# Patient Record
Sex: Male | Born: 1945 | Race: White | Hispanic: No | Marital: Married | Smoking: Former smoker
Health system: Southern US, Community
[De-identification: ages and names within clinical notes are randomized; demographics above are authoritative.]

## PROBLEM LIST (undated history)

## (undated) DIAGNOSIS — C801 Malignant (primary) neoplasm, unspecified: Secondary | ICD-10-CM

## (undated) DIAGNOSIS — E785 Hyperlipidemia, unspecified: Secondary | ICD-10-CM

## (undated) DIAGNOSIS — M109 Gout, unspecified: Secondary | ICD-10-CM

## (undated) DIAGNOSIS — K219 Gastro-esophageal reflux disease without esophagitis: Secondary | ICD-10-CM

## (undated) DIAGNOSIS — I776 Arteritis, unspecified: Secondary | ICD-10-CM

## (undated) DIAGNOSIS — I7300 Raynaud's syndrome without gangrene: Secondary | ICD-10-CM

## (undated) DIAGNOSIS — I10 Essential (primary) hypertension: Secondary | ICD-10-CM

## (undated) DIAGNOSIS — T1491XA Suicide attempt, initial encounter: Secondary | ICD-10-CM

## (undated) HISTORY — DX: Suicide attempt, initial encounter: T14.91XA

## (undated) HISTORY — DX: Raynaud's syndrome without gangrene: I73.00

## (undated) HISTORY — DX: Gout, unspecified: M10.9

## (undated) HISTORY — DX: Essential (primary) hypertension: I10

## (undated) HISTORY — PX: INGUINAL HERNIA REPAIR: SUR1180

## (undated) HISTORY — DX: Gastro-esophageal reflux disease without esophagitis: K21.9

## (undated) HISTORY — DX: Arteritis, unspecified: I77.6

## (undated) HISTORY — DX: Hyperlipidemia, unspecified: E78.5

---

## 2001-09-12 ENCOUNTER — Emergency Department (HOSPITAL_COMMUNITY): Admission: EM | Admit: 2001-09-12 | Discharge: 2001-09-12 | Payer: Self-pay | Admitting: Emergency Medicine

## 2003-01-19 ENCOUNTER — Encounter: Payer: Self-pay | Admitting: Internal Medicine

## 2003-01-19 LAB — CONVERTED CEMR LAB

## 2003-12-02 ENCOUNTER — Ambulatory Visit: Payer: Self-pay | Admitting: Internal Medicine

## 2003-12-10 ENCOUNTER — Ambulatory Visit: Payer: Self-pay | Admitting: Internal Medicine

## 2004-01-21 ENCOUNTER — Ambulatory Visit: Payer: Self-pay | Admitting: Internal Medicine

## 2004-01-28 ENCOUNTER — Ambulatory Visit: Payer: Self-pay | Admitting: Internal Medicine

## 2004-04-15 ENCOUNTER — Ambulatory Visit: Payer: Self-pay | Admitting: Internal Medicine

## 2004-05-06 ENCOUNTER — Encounter: Admission: RE | Admit: 2004-05-06 | Discharge: 2004-05-06 | Payer: Self-pay | Admitting: Internal Medicine

## 2004-05-06 ENCOUNTER — Ambulatory Visit: Payer: Self-pay | Admitting: Internal Medicine

## 2004-06-03 ENCOUNTER — Ambulatory Visit: Payer: Self-pay | Admitting: Internal Medicine

## 2004-07-16 ENCOUNTER — Ambulatory Visit: Payer: Self-pay | Admitting: Internal Medicine

## 2004-07-27 ENCOUNTER — Ambulatory Visit: Payer: Self-pay | Admitting: Internal Medicine

## 2004-10-27 ENCOUNTER — Ambulatory Visit: Payer: Self-pay | Admitting: Internal Medicine

## 2005-01-18 LAB — HM COLONOSCOPY: HM Colonoscopy: NORMAL

## 2005-02-23 ENCOUNTER — Ambulatory Visit: Payer: Self-pay | Admitting: Internal Medicine

## 2005-08-16 ENCOUNTER — Ambulatory Visit: Payer: Self-pay | Admitting: Internal Medicine

## 2005-08-23 ENCOUNTER — Ambulatory Visit: Payer: Self-pay | Admitting: Internal Medicine

## 2006-01-28 ENCOUNTER — Ambulatory Visit: Payer: Self-pay | Admitting: Internal Medicine

## 2006-02-22 ENCOUNTER — Ambulatory Visit: Payer: Self-pay | Admitting: Internal Medicine

## 2006-02-22 LAB — CONVERTED CEMR LAB
ALT: 19 units/L (ref 0–40)
AST: 21 units/L (ref 0–37)
Albumin: 3.9 g/dL (ref 3.5–5.2)
Alkaline Phosphatase: 65 units/L (ref 39–117)
Bilirubin, Direct: 0.2 mg/dL (ref 0.0–0.3)
Cholesterol: 204 mg/dL (ref 0–200)
Direct LDL: 124.1 mg/dL
HDL: 30.2 mg/dL — ABNORMAL LOW (ref >39.0)
Total Bilirubin: 0.9 mg/dL (ref 0.3–1.2)
Total CHOL/HDL Ratio: 6.8
Total Protein: 7.2 g/dL (ref 6.0–8.3)
Triglycerides: 256 mg/dL (ref 0–149)
VLDL: 51 mg/dL — ABNORMAL HIGH (ref 0–40)

## 2006-03-01 ENCOUNTER — Ambulatory Visit: Payer: Self-pay | Admitting: Internal Medicine

## 2006-03-07 ENCOUNTER — Emergency Department (HOSPITAL_COMMUNITY): Admission: EM | Admit: 2006-03-07 | Discharge: 2006-03-07 | Payer: Self-pay | Admitting: Emergency Medicine

## 2006-03-08 ENCOUNTER — Ambulatory Visit: Payer: Self-pay | Admitting: Internal Medicine

## 2006-03-17 ENCOUNTER — Ambulatory Visit: Payer: Self-pay | Admitting: Cardiology

## 2006-03-23 ENCOUNTER — Ambulatory Visit: Payer: Self-pay | Admitting: Internal Medicine

## 2006-03-25 ENCOUNTER — Ambulatory Visit: Payer: Self-pay | Admitting: Internal Medicine

## 2006-04-01 ENCOUNTER — Ambulatory Visit: Payer: Self-pay | Admitting: Internal Medicine

## 2006-04-18 ENCOUNTER — Ambulatory Visit: Payer: Self-pay | Admitting: Internal Medicine

## 2006-06-14 ENCOUNTER — Ambulatory Visit: Payer: Self-pay | Admitting: Internal Medicine

## 2006-07-21 ENCOUNTER — Encounter: Payer: Self-pay | Admitting: Internal Medicine

## 2006-07-21 DIAGNOSIS — I7300 Raynaud's syndrome without gangrene: Secondary | ICD-10-CM

## 2006-07-21 DIAGNOSIS — K219 Gastro-esophageal reflux disease without esophagitis: Secondary | ICD-10-CM

## 2006-07-21 DIAGNOSIS — I10 Essential (primary) hypertension: Secondary | ICD-10-CM

## 2006-07-21 HISTORY — DX: Raynaud's syndrome without gangrene: I73.00

## 2006-07-21 HISTORY — DX: Gastro-esophageal reflux disease without esophagitis: K21.9

## 2006-07-21 HISTORY — DX: Essential (primary) hypertension: I10

## 2006-08-11 ENCOUNTER — Ambulatory Visit: Payer: Self-pay | Admitting: Internal Medicine

## 2006-08-30 ENCOUNTER — Emergency Department (HOSPITAL_COMMUNITY): Admission: EM | Admit: 2006-08-30 | Discharge: 2006-08-30 | Payer: Self-pay | Admitting: Emergency Medicine

## 2006-09-02 ENCOUNTER — Ambulatory Visit: Payer: Self-pay | Admitting: Internal Medicine

## 2006-09-13 ENCOUNTER — Ambulatory Visit: Payer: Self-pay

## 2006-09-13 ENCOUNTER — Encounter: Payer: Self-pay | Admitting: Internal Medicine

## 2006-09-23 ENCOUNTER — Telehealth: Payer: Self-pay | Admitting: Internal Medicine

## 2007-01-19 DIAGNOSIS — Z9151 Personal history of suicidal behavior: Secondary | ICD-10-CM | POA: Insufficient documentation

## 2007-05-04 ENCOUNTER — Ambulatory Visit: Payer: Self-pay | Admitting: Internal Medicine

## 2007-05-05 ENCOUNTER — Telehealth: Payer: Self-pay | Admitting: Internal Medicine

## 2007-05-05 LAB — CONVERTED CEMR LAB
Basophils Absolute: 0 10*3/uL (ref 0.0–0.1)
Basophils Relative: 0 % (ref 0.0–1.0)
Eosinophils Absolute: 0.1 10*3/uL (ref 0.0–0.7)
Hemoglobin: 13.8 g/dL (ref 13.0–17.0)
MCHC: 34.6 g/dL (ref 30.0–36.0)
MCV: 85.6 fL (ref 78.0–100.0)
Monocytes Absolute: 1.1 10*3/uL — ABNORMAL HIGH (ref 0.1–1.0)
Neutro Abs: 10.7 10*3/uL — ABNORMAL HIGH (ref 1.4–7.7)
RBC: 4.65 M/uL (ref 4.22–5.81)
RDW: 12.1 % (ref 11.5–14.6)

## 2007-05-11 ENCOUNTER — Telehealth: Payer: Self-pay | Admitting: Internal Medicine

## 2007-05-11 ENCOUNTER — Ambulatory Visit: Payer: Self-pay | Admitting: Internal Medicine

## 2007-05-11 LAB — CONVERTED CEMR LAB
AST: 33 units/L (ref 0–37)
Alkaline Phosphatase: 117 units/L (ref 39–117)
Bilirubin, Direct: 0.1 mg/dL (ref 0.0–0.3)
CO2: 27 meq/L (ref 19–32)
Chloride: 105 meq/L (ref 96–112)
Eosinophils Relative: 1.1 % (ref 0.0–5.0)
GFR calc Af Amer: 110 mL/min
Glucose, Bld: 91 mg/dL (ref 70–99)
Lymphocytes Relative: 13.5 % (ref 12.0–46.0)
Monocytes Absolute: 0.6 10*3/uL (ref 0.1–1.0)
Monocytes Relative: 6.6 % (ref 3.0–12.0)
Neutrophils Relative %: 78.2 % — ABNORMAL HIGH (ref 43.0–77.0)
Platelets: 287 10*3/uL (ref 150–400)
Potassium: 4 meq/L (ref 3.5–5.1)
RDW: 11.8 % (ref 11.5–14.6)
Sodium: 141 meq/L (ref 135–145)
Total Protein: 8.3 g/dL (ref 6.0–8.3)
WBC: 8.9 10*3/uL (ref 4.5–10.5)

## 2007-05-12 ENCOUNTER — Ambulatory Visit: Payer: Self-pay | Admitting: Internal Medicine

## 2007-05-15 ENCOUNTER — Ambulatory Visit: Payer: Self-pay | Admitting: Internal Medicine

## 2007-05-15 LAB — CONVERTED CEMR LAB
Albumin ELP: 43.6 % — ABNORMAL LOW (ref 55.8–66.1)
Alpha-2-Globulin: 16.3 % — ABNORMAL HIGH (ref 7.1–11.8)
Anti Nuclear Antibody(ANA): NEGATIVE
CRP, High Sensitivity: 185 — ABNORMAL HIGH (ref 0.00–5.00)
Rhuematoid fact SerPl-aCnc: <20 intl units/mL — ABNORMAL LOW (ref 0.0–20.0)
Total Protein, Serum Electrophoresis: 8 g/dL (ref 6.0–8.3)

## 2007-05-16 ENCOUNTER — Ambulatory Visit: Payer: Self-pay | Admitting: Cardiology

## 2007-05-16 ENCOUNTER — Encounter: Payer: Self-pay | Admitting: Internal Medicine

## 2007-05-18 ENCOUNTER — Ambulatory Visit: Payer: Self-pay | Admitting: Internal Medicine

## 2007-05-18 ENCOUNTER — Inpatient Hospital Stay (HOSPITAL_COMMUNITY): Admission: EM | Admit: 2007-05-18 | Discharge: 2007-05-20 | Payer: Self-pay | Admitting: Emergency Medicine

## 2007-05-19 ENCOUNTER — Telehealth: Payer: Self-pay | Admitting: Internal Medicine

## 2007-05-23 ENCOUNTER — Ambulatory Visit: Payer: Self-pay | Admitting: Internal Medicine

## 2007-05-23 DIAGNOSIS — I776 Arteritis, unspecified: Secondary | ICD-10-CM

## 2007-05-23 DIAGNOSIS — Z8679 Personal history of other diseases of the circulatory system: Secondary | ICD-10-CM | POA: Insufficient documentation

## 2007-05-23 HISTORY — DX: Arteritis, unspecified: I77.6

## 2007-05-29 ENCOUNTER — Ambulatory Visit: Payer: Self-pay | Admitting: Internal Medicine

## 2007-05-30 ENCOUNTER — Encounter: Payer: Self-pay | Admitting: Internal Medicine

## 2007-06-13 ENCOUNTER — Ambulatory Visit: Payer: Self-pay | Admitting: Vascular Surgery

## 2007-06-13 ENCOUNTER — Encounter: Payer: Self-pay | Admitting: Internal Medicine

## 2007-06-15 ENCOUNTER — Telehealth: Payer: Self-pay | Admitting: Internal Medicine

## 2007-06-15 ENCOUNTER — Inpatient Hospital Stay (HOSPITAL_COMMUNITY): Admission: AC | Admit: 2007-06-15 | Discharge: 2007-06-22 | Payer: Self-pay

## 2007-06-15 ENCOUNTER — Ambulatory Visit: Payer: Self-pay | Admitting: Surgery

## 2007-06-17 ENCOUNTER — Ambulatory Visit: Payer: Self-pay | Admitting: Psychiatry

## 2007-06-19 ENCOUNTER — Ambulatory Visit: Payer: Self-pay | Admitting: Psychiatry

## 2007-06-22 ENCOUNTER — Inpatient Hospital Stay (HOSPITAL_COMMUNITY): Admission: RE | Admit: 2007-06-22 | Discharge: 2007-07-05 | Payer: Self-pay | Admitting: Psychiatry

## 2007-06-22 ENCOUNTER — Ambulatory Visit: Payer: Self-pay | Admitting: Psychiatry

## 2007-07-06 ENCOUNTER — Other Ambulatory Visit (HOSPITAL_COMMUNITY): Admission: RE | Admit: 2007-07-06 | Discharge: 2007-07-14 | Payer: Self-pay | Admitting: Psychiatry

## 2007-07-10 ENCOUNTER — Emergency Department (HOSPITAL_COMMUNITY): Admission: EM | Admit: 2007-07-10 | Discharge: 2007-07-10 | Payer: Self-pay | Admitting: Emergency Medicine

## 2007-07-10 ENCOUNTER — Telehealth: Payer: Self-pay | Admitting: Internal Medicine

## 2007-07-12 ENCOUNTER — Ambulatory Visit: Payer: Self-pay | Admitting: Internal Medicine

## 2007-07-14 ENCOUNTER — Ambulatory Visit: Payer: Self-pay | Admitting: Psychiatry

## 2007-07-14 ENCOUNTER — Ambulatory Visit: Payer: Self-pay | Admitting: Internal Medicine

## 2007-07-14 ENCOUNTER — Inpatient Hospital Stay (HOSPITAL_COMMUNITY): Admission: RE | Admit: 2007-07-14 | Discharge: 2007-07-27 | Payer: Self-pay | Admitting: Psychiatry

## 2007-07-19 ENCOUNTER — Ambulatory Visit (HOSPITAL_COMMUNITY): Admission: RE | Admit: 2007-07-19 | Discharge: 2007-07-19 | Payer: Self-pay | Admitting: Psychiatry

## 2007-07-26 ENCOUNTER — Ambulatory Visit (HOSPITAL_COMMUNITY): Admission: RE | Admit: 2007-07-26 | Discharge: 2007-07-26 | Payer: Self-pay | Admitting: Psychiatry

## 2007-07-27 ENCOUNTER — Telehealth: Payer: Self-pay | Admitting: Internal Medicine

## 2007-07-31 ENCOUNTER — Other Ambulatory Visit (HOSPITAL_COMMUNITY): Admission: RE | Admit: 2007-07-31 | Discharge: 2007-08-21 | Payer: Self-pay | Admitting: Psychiatry

## 2007-08-01 ENCOUNTER — Ambulatory Visit: Payer: Self-pay | Admitting: Psychiatry

## 2007-08-08 ENCOUNTER — Ambulatory Visit: Payer: Self-pay | Admitting: Surgery

## 2007-08-08 ENCOUNTER — Encounter: Admission: RE | Admit: 2007-08-08 | Discharge: 2007-08-08 | Payer: Self-pay | Admitting: Surgery

## 2007-08-09 ENCOUNTER — Telehealth: Payer: Self-pay | Admitting: Internal Medicine

## 2007-08-10 ENCOUNTER — Ambulatory Visit: Payer: Self-pay | Admitting: Internal Medicine

## 2007-08-10 LAB — CONVERTED CEMR LAB
CO2: 31 meq/L (ref 19–32)
Glucose, Bld: 84 mg/dL (ref 70–99)
Potassium: 4.5 meq/L (ref 3.5–5.1)
Sodium: 135 meq/L (ref 135–145)

## 2007-08-23 ENCOUNTER — Ambulatory Visit: Payer: Self-pay | Admitting: Internal Medicine

## 2007-08-24 LAB — CONVERTED CEMR LAB
AST: 13 units/L (ref 0–37)
Albumin: 2.9 g/dL — ABNORMAL LOW (ref 3.5–5.2)
BUN: 13 mg/dL (ref 6–23)
Basophils Relative: 0.3 % (ref 0.0–3.0)
Creatinine, Ser: 0.8 mg/dL (ref 0.4–1.5)
Eosinophils Absolute: 0.1 10*3/uL (ref 0.0–0.7)
Eosinophils Relative: 0.7 % (ref 0.0–5.0)
GFR calc Af Amer: 126 mL/min
GFR calc non Af Amer: 104 mL/min
HCT: 32.1 % — ABNORMAL LOW (ref 39.0–52.0)
MCV: 75 fL — ABNORMAL LOW (ref 78.0–100.0)
Monocytes Absolute: 0.7 10*3/uL (ref 0.1–1.0)
Neutro Abs: 5.7 10*3/uL (ref 1.4–7.7)
RBC: 4.27 M/uL (ref 4.22–5.81)
TSH: 1.3 microintl units/mL (ref 0.35–5.50)
WBC: 7.5 10*3/uL (ref 4.5–10.5)

## 2007-08-25 ENCOUNTER — Telehealth: Payer: Self-pay | Admitting: Internal Medicine

## 2007-08-30 ENCOUNTER — Telehealth: Payer: Self-pay | Admitting: Internal Medicine

## 2007-08-31 ENCOUNTER — Encounter: Payer: Self-pay | Admitting: Internal Medicine

## 2007-09-12 ENCOUNTER — Telehealth: Payer: Self-pay | Admitting: Internal Medicine

## 2007-09-13 ENCOUNTER — Encounter: Payer: Self-pay | Admitting: Internal Medicine

## 2007-09-14 ENCOUNTER — Ambulatory Visit: Payer: Self-pay | Admitting: Internal Medicine

## 2007-09-14 LAB — CONVERTED CEMR LAB
ALT: 6 units/L (ref 0–53)
BUN: 7 mg/dL (ref 6–23)
Basophils Relative: 0.7 % (ref 0.0–3.0)
Bilirubin Urine: NEGATIVE
CO2: 33 meq/L — ABNORMAL HIGH (ref 19–32)
Calcium: 9.6 mg/dL (ref 8.4–10.5)
Cholesterol: 199 mg/dL (ref 0–200)
Creatinine, Ser: 0.8 mg/dL (ref 0.4–1.5)
Eosinophils Relative: 1 % (ref 0.0–5.0)
Glucose, Bld: 90 mg/dL (ref 70–99)
Glucose, Urine, Semiquant: NEGATIVE
Hemoglobin: 11.2 g/dL — ABNORMAL LOW (ref 13.0–17.0)
LDL Cholesterol: 145 mg/dL — ABNORMAL HIGH (ref 0–99)
Lymphocytes Relative: 18.7 % (ref 12.0–46.0)
MCHC: 33.4 g/dL (ref 30.0–36.0)
Monocytes Relative: 8.4 % (ref 3.0–12.0)
Neutro Abs: 5 10*3/uL (ref 1.4–7.7)
PSA: 1.13 ng/mL (ref 0.10–4.00)
RBC: 4.54 M/uL (ref 4.22–5.81)
TSH: 1.75 microintl units/mL (ref 0.35–5.50)
Total Protein: 7.6 g/dL (ref 6.0–8.3)
Urobilinogen, UA: 0.2
WBC: 7 10*3/uL (ref 4.5–10.5)

## 2007-09-21 ENCOUNTER — Ambulatory Visit: Payer: Self-pay | Admitting: Internal Medicine

## 2007-09-21 LAB — CONVERTED CEMR LAB
Nitrite: NEGATIVE
Specific Gravity, Urine: 1.02
Urobilinogen, UA: 0.2
WBC Urine, dipstick: NEGATIVE

## 2007-09-22 LAB — CONVERTED CEMR LAB
ALT: 13 units/L (ref 0–53)
AST: 14 units/L (ref 0–37)
Albumin: 3.7 g/dL (ref 3.5–5.2)
Alkaline Phosphatase: 72 units/L (ref 39–117)
BUN: 12 mg/dL (ref 6–23)
Bilirubin, Direct: 0.1 mg/dL (ref 0.0–0.3)
CO2: 36 meq/L — ABNORMAL HIGH (ref 19–32)
Eosinophils Relative: 0.9 % (ref 0.0–5.0)
GFR calc Af Amer: 147 mL/min
Glucose, Bld: 87 mg/dL (ref 70–99)
HCT: 39.2 % (ref 39.0–52.0)
HDL: 34.5 mg/dL — ABNORMAL LOW (ref >39.0)
Hemoglobin: 12.7 g/dL — ABNORMAL LOW (ref 13.0–17.0)
LDL Cholesterol: 90 mg/dL (ref 0–99)
Lymphocytes Relative: 18.7 % (ref 12.0–46.0)
Monocytes Absolute: 0.5 10*3/uL (ref 0.1–1.0)
Monocytes Relative: 6.8 % (ref 3.0–12.0)
Platelets: 249 10*3/uL (ref 150–400)
Potassium: 4 meq/L (ref 3.5–5.1)
Sodium: 142 meq/L (ref 135–145)
Total CHOL/HDL Ratio: 4.4
Total Protein: 7.6 g/dL (ref 6.0–8.3)
WBC: 7.2 10*3/uL (ref 4.5–10.5)

## 2007-10-10 ENCOUNTER — Encounter: Payer: Self-pay | Admitting: Internal Medicine

## 2007-11-13 ENCOUNTER — Encounter: Payer: Self-pay | Admitting: Internal Medicine

## 2007-12-25 ENCOUNTER — Encounter: Payer: Self-pay | Admitting: Internal Medicine

## 2008-01-31 ENCOUNTER — Encounter: Payer: Self-pay | Admitting: Internal Medicine

## 2008-02-19 ENCOUNTER — Ambulatory Visit: Payer: Self-pay | Admitting: Internal Medicine

## 2008-02-20 ENCOUNTER — Encounter: Payer: Self-pay | Admitting: Internal Medicine

## 2008-02-26 ENCOUNTER — Encounter: Payer: Self-pay | Admitting: Internal Medicine

## 2008-03-20 ENCOUNTER — Encounter: Payer: Self-pay | Admitting: Internal Medicine

## 2008-04-08 ENCOUNTER — Encounter: Payer: Self-pay | Admitting: Internal Medicine

## 2008-07-24 ENCOUNTER — Encounter: Payer: Self-pay | Admitting: Internal Medicine

## 2008-09-17 ENCOUNTER — Encounter: Payer: Self-pay | Admitting: Internal Medicine

## 2008-09-18 ENCOUNTER — Telehealth: Payer: Self-pay | Admitting: Internal Medicine

## 2008-09-24 ENCOUNTER — Ambulatory Visit: Payer: Self-pay | Admitting: Internal Medicine

## 2008-10-22 ENCOUNTER — Ambulatory Visit: Payer: Self-pay | Admitting: Internal Medicine

## 2008-12-17 ENCOUNTER — Encounter (INDEPENDENT_AMBULATORY_CARE_PROVIDER_SITE_OTHER): Payer: Self-pay | Admitting: *Deleted

## 2009-03-19 ENCOUNTER — Encounter: Payer: Self-pay | Admitting: Internal Medicine

## 2009-04-08 ENCOUNTER — Encounter: Payer: Self-pay | Admitting: Internal Medicine

## 2009-04-14 ENCOUNTER — Encounter: Payer: Self-pay | Admitting: Internal Medicine

## 2009-04-22 ENCOUNTER — Ambulatory Visit: Payer: Self-pay | Admitting: Internal Medicine

## 2009-04-23 ENCOUNTER — Encounter: Payer: Self-pay | Admitting: Internal Medicine

## 2009-06-10 ENCOUNTER — Ambulatory Visit: Payer: Self-pay | Admitting: Internal Medicine

## 2009-06-10 LAB — CONVERTED CEMR LAB
ALT: 21 units/L (ref 0–53)
AST: 22 units/L (ref 0–37)
BUN: 15 mg/dL (ref 6–23)
Basophils Relative: 0.5 % (ref 0.0–3.0)
Bilirubin, Direct: 0.2 mg/dL (ref 0.0–0.3)
Blood in Urine, dipstick: NEGATIVE
Chloride: 105 meq/L (ref 96–112)
Eosinophils Relative: 4.2 % (ref 0.0–5.0)
GFR calc non Af Amer: 70.87 mL/min (ref >60)
HCT: 46.7 % (ref 39.0–52.0)
HDL: 30.8 mg/dL — ABNORMAL LOW (ref >39.00)
Lymphs Abs: 1.7 10*3/uL (ref 0.7–4.0)
MCV: 92.3 fL (ref 78.0–100.0)
Monocytes Relative: 10.3 % (ref 3.0–12.0)
Neutrophils Relative %: 58 % (ref 43.0–77.0)
Nitrite: NEGATIVE
Platelets: 133 10*3/uL — ABNORMAL LOW (ref 150.0–400.0)
Potassium: 4.3 meq/L (ref 3.5–5.1)
Protein, U semiquant: NEGATIVE
RBC: 5.06 M/uL (ref 4.22–5.81)
Total Bilirubin: 1.2 mg/dL (ref 0.3–1.2)
Total CHOL/HDL Ratio: 7
Total Protein: 6.9 g/dL (ref 6.0–8.3)
Urobilinogen, UA: 0.2
VLDL: 35.6 mg/dL (ref 0.0–40.0)
WBC Urine, dipstick: NEGATIVE
WBC: 6.1 10*3/uL (ref 4.5–10.5)
pH: 5.5

## 2009-06-17 ENCOUNTER — Telehealth: Payer: Self-pay | Admitting: Internal Medicine

## 2009-06-17 ENCOUNTER — Ambulatory Visit: Payer: Self-pay | Admitting: Internal Medicine

## 2009-06-17 DIAGNOSIS — E785 Hyperlipidemia, unspecified: Secondary | ICD-10-CM

## 2009-06-17 HISTORY — DX: Hyperlipidemia, unspecified: E78.5

## 2009-09-03 ENCOUNTER — Ambulatory Visit: Payer: Self-pay | Admitting: Internal Medicine

## 2009-09-10 ENCOUNTER — Encounter: Payer: Self-pay | Admitting: Internal Medicine

## 2009-09-29 ENCOUNTER — Ambulatory Visit: Payer: Self-pay | Admitting: Internal Medicine

## 2009-09-29 LAB — CONVERTED CEMR LAB
Albumin: 4.1 g/dL (ref 3.5–5.2)
Cholesterol: 194 mg/dL (ref 0–200)
LDL Cholesterol: 140 mg/dL — ABNORMAL HIGH (ref 0–99)
Total CHOL/HDL Ratio: 7
Total Protein: 7.2 g/dL (ref 6.0–8.3)
Triglycerides: 136 mg/dL (ref 0.0–149.0)
VLDL: 27.2 mg/dL (ref 0.0–40.0)

## 2009-10-16 ENCOUNTER — Ambulatory Visit: Payer: Self-pay | Admitting: Internal Medicine

## 2009-10-31 ENCOUNTER — Ambulatory Visit: Payer: Self-pay | Admitting: Internal Medicine

## 2009-11-04 ENCOUNTER — Telehealth: Payer: Self-pay | Admitting: Internal Medicine

## 2009-11-04 LAB — CONVERTED CEMR LAB
Basophils Absolute: 0 10*3/uL (ref 0.0–0.1)
Chloride: 101 meq/L (ref 96–112)
Eosinophils Relative: 4.1 % (ref 0.0–5.0)
GFR calc non Af Amer: 70.79 mL/min (ref >60)
Hemoglobin: 15.4 g/dL (ref 13.0–17.0)
Lymphocytes Relative: 17.3 % (ref 12.0–46.0)
Monocytes Relative: 6.3 % (ref 3.0–12.0)
Neutro Abs: 6 10*3/uL (ref 1.4–7.7)
Phosphorus: 3 mg/dL (ref 2.3–4.6)
Potassium: 4.9 meq/L (ref 3.5–5.1)
RBC: 4.99 M/uL (ref 4.22–5.81)
RDW: 13.1 % (ref 11.5–14.6)
Sodium: 139 meq/L (ref 135–145)
WBC: 8.3 10*3/uL (ref 4.5–10.5)

## 2009-11-20 ENCOUNTER — Ambulatory Visit: Payer: Self-pay | Admitting: Internal Medicine

## 2010-01-22 ENCOUNTER — Ambulatory Visit
Admission: RE | Admit: 2010-01-22 | Discharge: 2010-01-22 | Payer: Self-pay | Source: Home / Self Care | Attending: Internal Medicine | Admitting: Internal Medicine

## 2010-01-22 ENCOUNTER — Encounter: Payer: Self-pay | Admitting: Internal Medicine

## 2010-01-22 ENCOUNTER — Ambulatory Visit: Payer: Self-pay | Admitting: Internal Medicine

## 2010-01-22 VITALS — BP 154/90 | HR 80 | Temp 97.8°F | Wt 158.0 lb

## 2010-01-22 NOTE — Progress Notes (Signed)
Subjective:     Patient ID: Kevin Perez is a 65 y.o. male.  HPI   Review of Systems     Objective:   Physical Exam     History of Present Illness:   Follow-Up Visit      This is a 65 year old man who presents for Follow-up visit.  The patient denies chest pain and palpitations.  Since the last visit the patient notes no new problems or concerns.  The patient reports taking meds as prescribed, monitoring BP, and exercising on regular basis.  When questioned about possible medication side effects, the patient notes none.  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 Once Daily 2)  Klonopin 1 Mg Tabs (Clonazepam) .... Take 1 Tablet By Mouth Once A Day As Needed Anxiety 3)  Losartan Potassium 50 Mg Tabs (Losartan Potassium) .... Take 1 Tablet By Mouth Once A Day  Allergies (verified):  1)  ! 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 2008 suicide attempt on steroids Hyperlipidemia  Past Surgical History: Last updated: 09/21/2007 polytrauma-suicide attempt  Family History: Last updated: 09/24/2008 father history of prostate cancer, and hypertension at age 62 mother alive and well at age 65 one brother died in a construction accident father deceased 65 yo - presumed stroke  Social History: Last updated: 05/18/2007 Married Never Smoked Alcohol use-no Regular exercise-yes Former Smoker  Risk  Factors: Exercise: yes (08/11/2006)  Risk Factors: Smoking Status: quit (09/03/2009)

## 2010-01-31 ENCOUNTER — Emergency Department (HOSPITAL_BASED_OUTPATIENT_CLINIC_OR_DEPARTMENT_OTHER)
Admission: EM | Admit: 2010-01-31 | Discharge: 2010-01-31 | Payer: Self-pay | Source: Home / Self Care | Admitting: Emergency Medicine

## 2010-02-02 ENCOUNTER — Telehealth: Payer: Self-pay | Admitting: Internal Medicine

## 2010-02-02 LAB — DIFFERENTIAL
Basophils Absolute: 0 10*3/uL (ref 0.0–0.1)
Basophils Relative: 0 % (ref 0–1)
Eosinophils Absolute: 0.3 10*3/uL (ref 0.0–0.7)
Eosinophils Relative: 3 % (ref 0–5)
Lymphocytes Relative: 15 % (ref 12–46)
Lymphs Abs: 1.6 10*3/uL (ref 0.7–4.0)
Monocytes Absolute: 0.9 10*3/uL (ref 0.1–1.0)
Monocytes Relative: 9 % (ref 3–12)
Neutro Abs: 7.7 10*3/uL (ref 1.7–7.7)
Neutrophils Relative %: 73 % (ref 43–77)

## 2010-02-02 LAB — BASIC METABOLIC PANEL
BUN: 16 mg/dL (ref 6–23)
CO2: 28 mEq/L (ref 19–32)
Calcium: 9.7 mg/dL (ref 8.4–10.5)
Chloride: 102 mEq/L (ref 96–112)
Creatinine, Ser: 1 mg/dL (ref 0.4–1.5)
GFR calc Af Amer: >60 mL/min (ref >60)
GFR calc non Af Amer: >60 mL/min (ref >60)
Glucose, Bld: 102 mg/dL — ABNORMAL HIGH (ref 70–99)
Potassium: 4.7 mEq/L (ref 3.5–5.1)
Sodium: 144 mEq/L (ref 135–145)

## 2010-02-02 LAB — GLUCOSE, SEROUS FLUID: Glucose, Fluid: 54 mg/dL

## 2010-02-02 LAB — SYNOVIAL CELL COUNT + DIFF, W/ CRYSTALS
Crystals, Fluid: NONE SEEN
Monocyte-Macrophage-Synovial Fluid: 3 % — ABNORMAL LOW (ref 50–90)
Neutrophil, Synovial: 97 % — ABNORMAL HIGH (ref 0–25)
WBC, Synovial: 41 /mm3 (ref 0–200)

## 2010-02-02 LAB — CBC
HCT: 45.4 % (ref 39.0–52.0)
Hemoglobin: 15.5 g/dL (ref 13.0–17.0)
MCH: 28.7 pg (ref 26.0–34.0)
MCHC: 34.1 g/dL (ref 30.0–36.0)
MCV: 83.9 fL (ref 78.0–100.0)
Platelets: 143 10*3/uL — ABNORMAL LOW (ref 150–400)
RBC: 5.41 MIL/uL (ref 4.22–5.81)
RDW: 13.3 % (ref 11.5–15.5)
WBC: 10.5 10*3/uL (ref 4.0–10.5)

## 2010-02-02 LAB — GRAM STAIN

## 2010-02-02 LAB — PROTEIN, BODY FLUID: Total protein, fluid: 4.1 g/dL

## 2010-02-03 ENCOUNTER — Ambulatory Visit
Admission: RE | Admit: 2010-02-03 | Discharge: 2010-02-03 | Payer: Self-pay | Source: Home / Self Care | Attending: Internal Medicine | Admitting: Internal Medicine

## 2010-02-03 DIAGNOSIS — M25569 Pain in unspecified knee: Secondary | ICD-10-CM | POA: Insufficient documentation

## 2010-02-08 ENCOUNTER — Encounter: Payer: Self-pay | Admitting: Surgery

## 2010-02-09 LAB — CULTURE, BLOOD (ROUTINE X 2)
Culture  Setup Time: 201201141607
Culture: NO GROWTH

## 2010-02-09 LAB — BODY FLUID CULTURE: Culture: NO GROWTH

## 2010-02-17 NOTE — Letter (Signed)
Summary: Rheumatology-Dr. Kellie Simmering  Rheumatology-Dr. Kellie Simmering   Imported By: Maryln Gottron 04/01/2009 11:18:13  _____________________________________________________________________  External Attachment:    Type:   Image     Comment:   External Document

## 2010-02-17 NOTE — Letter (Signed)
Summary: [facility]  [facility]   Imported By: Maryln Gottron 09/02/2008 14:59:26  _____________________________________________________________________  External Attachment:    Type:   Image     Comment:   External Document

## 2010-02-17 NOTE — Assessment & Plan Note (Signed)
Summary: 4 MNTH ROV//SLM   Vital Signs:  Patient profile:   65 year old male Weight:      156 pounds BMI:     23.81 Temp:     98.2 degrees F oral Pulse rate:   74 / minute Resp:     12 per minute BP sitting:   120 / 66  Vitals Entered By: Lynann Beaver CMA (October 16, 2009 8:40 AM) CC: rov Is Patient Diabetic? No Pain Assessment Patient in pain? no        CC:  rov.  History of Present Illness:  Follow-Up Visit: pt here with wife      This is a 25 year old man who presents for Follow-up visit.  The patient denies chest pain and palpitations.  Since the last visit the patient notes no new problems or concerns.  The patient reports taking meds as prescribed.  When questioned about possible medication side effects, the patient notes dry cough.    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 Once Daily 2)  Tylenol 325 Mg Tabs (Acetaminophen) .... Take Two As Directed As Needed 3)  Advil 200 Mg Tabs (Ibuprofen) .... Take Two As Directed As Needed 4)  Lisinopril 20 Mg Tabs (Lisinopril) .Marland Kitchen.. 1 Tablet By Mouth Daily 5)  Klonopin 1 Mg Tabs (Clonazepam) .... Take 1 Tablet By Mouth Once A Day As Needed Anxiety  Allergies (verified): 1)  ! 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 2008 suicide attempt on steroids Hyperlipidemia  Past Surgical History: Last updated: 09/21/2007 polytrauma-suicide attempt  Family History: Last updated:  09/24/2008 father history of prostate cancer, and hypertension at age 97 mother alive and well at age 90 one brother died in a construction accident father deceased 51 yo - presumed stroke  Social History: Last updated: 05/18/2007 Married Never Smoked Alcohol use-no Regular exercise-yes Former Smoker  Risk Factors: Exercise: yes (08/11/2006)  Risk Factors: Smoking Status: quit (09/03/2009)  Physical Exam  General:  well-developed well-nourished male in no acute distress. HEENT exam atraumatic, normocephalic. Extra muscles intact. Neck is supple without lymphadenopathy or thyromegaly. Chest clear to auscultation without increased work of breathing. Cardiac exam S1-S2 are regular. Abdominal exam active bowel sounds, soft, nontender. Extremities no cyanosis or edema. Neurologic exam he is alert gait is normal.   Impression & Recommendations:  Problem # 1:  HYPERLIPIDEMIA (ICD-272.4) no meds no need for Rx Labs Reviewed: SGOT: 23 (09/29/2009)   SGPT: 16 (09/29/2009)  Prior 10 Yr Risk Heart Disease: Not enough information (08/10/2007)   HDL:26.80 (09/29/2009), 30.80 (06/10/2009)  LDL:140 (09/29/2009), 156 (06/10/2009)  Chol:194 (09/29/2009), 223 (06/10/2009)  Trig:136.0 (09/29/2009), 178.0 (06/10/2009)  Problem # 2:  HYPERTENSION (ICD-401.9) controlled but may have associated cough.  The following medications were removed from the medication list:    Lisinopril 20 Mg Tabs (Lisinopril) .Marland Kitchen... 1 tablet by mouth daily His updated medication list for this problem includes:    Losartan Potassium 50 Mg Tabs (Losartan potassium) .Marland Kitchen... Take 1 tablet by mouth once a day  BP today: 120/66 Prior BP: 130/54 (09/03/2009)  Prior 10 Yr Risk Heart Disease: Not enough information (08/10/2007)  Labs Reviewed: K+: 4.3 (06/10/2009) Creat: : 1.1 (06/10/2009)   Chol: 194 (09/29/2009)   HDL: 26.80 (09/29/2009)   LDL: 140 (09/29/2009)   TG: 136.0 (09/29/2009)  Complete Medication List: 1)   Caltrate 600+d 600-400 Mg-unit Tabs (Calcium carbonate-vitamin d) .... Once daily 2)  Tylenol 325 Mg Tabs (Acetaminophen) .... Take two as directed as needed 3)  Advil 200 Mg Tabs (Ibuprofen) .... Take two as directed as needed 4)  Klonopin 1 Mg Tabs (Clonazepam) .... Take 1 tablet by mouth once a day as needed anxiety 5)  Losartan Potassium 50 Mg Tabs (Losartan potassium) .... Take 1 tablet by mouth once a day  Patient Instructions: 1)  Please schedule a follow-up appointment in 4 months. Prescriptions: LOSARTAN POTASSIUM 50 MG TABS (LOSARTAN POTASSIUM) Take 1 tablet by mouth once a day  #30 x 11   Entered and Authorized by:   Birdie Sons MD   Signed by:   Birdie Sons MD on 10/16/2009   Method used:   [facility]        [facility]  [place] [address] [facility]retail[facility]       [address]       [place], [place]  [address]       Ph: [phone] or [phone]       Fax: [phone]   RxID:   [id number]

## 2010-02-17 NOTE — Letter (Signed)
Summary: Rheumatology-Dr. Stacey Drain  Rheumatology-Dr. Stacey Drain   Imported By: Maryln Gottron 09/30/2009 10:44:10  _____________________________________________________________________  External Attachment:    Type:   Image     Comment:   External Document

## 2010-02-17 NOTE — Assessment & Plan Note (Signed)
Summary: cpx/njr   Vital Signs:  Patient profile:   65 year old male Height:      68 inches Weight:      164 pounds BMI:     25.03 Temp:     98.2 degrees F oral Pulse rate:   70 / minute Pulse rhythm:   regular Resp:     12 per minute BP sitting:   126 / 72  Vitals Entered By: Lynann Beaver CMA (Jun 17, 2009 8:17 AM) CC: cpx Is Patient Diabetic? No Pain Assessment Patient in pain? no        CC:  cpx.  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 Once Daily 2)  Folic Acid 1 Mg Tabs (Folic Acid) .... Once Daily 3)  Tylenol 325 Mg Tabs (Acetaminophen) .... Take Two As Directed As Needed 4)  Advil 200 Mg Tabs (Ibuprofen) .... Take Two As Directed As Needed 5)  Lisinopril 20 Mg Tabs (Lisinopril) .Marland Kitchen.. 1 Tablet By Mouth Daily 6)  Klonopin 1 Mg Tabs (Clonazepam) .... One By Mouth Q Hs  Allergies (verified): 1)  Codeine Phosphate (Codeine Phosphate)  Past History:  Past Surgical History: Last updated: 09/21/2007 polytrauma-suicide attempt  Family History: Last updated: 09/24/2008 father history of prostate cancer, and hypertension at age 64 mother alive and well at age 29 one brother died in a construction accident father deceased 85 yo - presumed stroke  Social History: Last updated: 05/18/2007 Married Never Smoked Alcohol use-no Regular exercise-yes Former Smoker  Risk Factors: Exercise: yes (08/11/2006)  Risk Factors: Smoking Status: quit (04/22/2009)  Past Medical History: GERD Hypertension Raynaud's, h/o  (doubtful) Gout presumed vasculitis 2008 suicide attempt on steroids Hyperlipidemia  Review of  Systems       All other systems reviewed and were negative   Physical Exam  General:  alert and well-developed.   Head:  normocephalic and atraumatic.   Eyes:  pupils equal and pupils round.   Ears:  R ear normal and L ear normal.   Nose:  no external deformity and no external erythema.   Neck:  No deformities, masses, or tenderness noted. Chest Wall:  No deformities, masses, tenderness or gynecomastia noted. Lungs:  Normal respiratory effort, chest expands symmetrically. Lungs are clear to auscultation, no crackles or wheezes. Heart:  normal rate and regular rhythm.   Abdomen:  Bowel sounds positive,abdomen soft and non-tender without masses, organomegaly or hernias noted. Prostate:  no nodules and no asymmetry.   Msk:  No deformity or scoliosis noted of thoracic or lumbar spine.   Pulses:  R radial normal and L radial normal.   Neurologic:  cranial nerves II-XII intact and gait normal.   Skin:  turgor normal and color normal.   Psych:  Oriented X3 and good eye contact.     Impression & Recommendations:  Problem # 1:  PREVENTIVE HEALTH CARE (ICD-V70.0) health maint UTD Orders: EKG w/ Interpretation (93000)  Problem # 2:  HYPERTENSION (ICD-401.9) Well controlled His updated medication list for this problem includes:    Lisinopril 20 Mg Tabs (Lisinopril) .Marland Kitchen... 1 tablet by mouth daily  BP today: 126/72 Prior BP: 148/76 (04/22/2009)  Prior 10 Yr Risk Heart Disease:  Not enough information (08/10/2007)  Labs Reviewed: K+: 4.3 (06/10/2009) Creat: : 1.1 (06/10/2009)   Chol: 223 (06/10/2009)   HDL: 30.80 (06/10/2009)   LDL: 156 (06/10/2009)   TG: 178.0 (06/10/2009)  Problem # 3:  VASCULITIS (ICD-447.6) no recurrent sxs off of MTX and all other related meds  Problem # 4:  HYPERLIPIDEMIA (ICD-272.4) discussed low fat diet recheck in 4 motnhs Labs Reviewed: SGOT: 22 (06/10/2009)   SGPT: 21 (06/10/2009)  Prior 10 Yr Risk Heart Disease: Not enough information (08/10/2007)    HDL:30.80 (06/10/2009), 34.5 (09/21/2007)  LDL:156 (06/10/2009), 90 (29/56/2130)  Chol:223 (06/10/2009), 152 (09/21/2007)  Trig:178.0 (06/10/2009), 137 (09/21/2007)  Complete Medication List: 1)  Caltrate 600+d 600-400 Mg-unit Tabs (Calcium carbonate-vitamin d) .... Once daily 2)  Folic Acid 1 Mg Tabs (Folic acid) .... Once daily 3)  Tylenol 325 Mg Tabs (Acetaminophen) .... Take two as directed as needed 4)  Advil 200 Mg Tabs (Ibuprofen) .... Take two as directed as needed 5)  Lisinopril 20 Mg Tabs (Lisinopril) .Marland Kitchen.. 1 tablet by mouth daily 6)  Klonopin 1 Mg Tabs (Clonazepam) .... One by mouth q hs  Patient Instructions: 1)  Please schedule a follow-up appointment in 4 months. 2)  lipids 272.4 3)  liver 995.2      EKG Report  Procedure date:  06/17/2009  Findings:      Sinus bradycardia with rate of: 51  prwp v1-v4   Appended Document: Orders Update     Clinical Lists Changes  Orders: Added new Service order of TD Toxoids IM 7 YR + (86578) - Signed Added new Service order of Admin 1st Vaccine (46962) - Signed Observations: Added new observation of TD BOOSTERLO: [id number] (06/17/2009 9:09) Added new observation of TD BOOST EXP: 01/30/2010 (06/17/2009 9:09) Added new observation of TD BOOSTERBY: Debby Meadows CMA (06/17/2009 9:09) Added new observation of TD BOOSTERRT: IM (06/17/2009 9:09) Added new observation of TDBOOSTERDSE: 0.5 ml (06/17/2009 9:09) Added new observation of TD BOOSTERMF: Sanofi Pasteur (06/17/2009 9:09) Added new observation of TD BOOST SIT: right deltoid (06/17/2009 9:09) Added new observation of TD BOOSTER: Td (06/17/2009 9:09)       Immunizations Administered:  Tetanus Vaccine:    Vaccine Type: Td    Site: right deltoid    Mfr: Sanofi Pasteur    Dose: 0.5 ml    Route: IM    Given by: Lynann Beaver CMA    Exp. Date: 01/30/2010    Lot #: [id number]

## 2010-02-17 NOTE — Progress Notes (Signed)
Summary: requesting lab results  Phone Note Call from Patient Call back at Home Phone [phone]   Caller: [facility] mail Reason for Call: Lab or Test Results Summary of Call: requesting lab results from last Friday. Initial call taken by: Warnell Forester,  November 04, 2009 10:42 AM  Follow-up for Phone Call        see appended to labs Follow-up by: Birdie Sons MD,  November 04, 2009 3:22 PM     Appended Document: requesting lab results given Dr. Cato Mulligan recommendations to pt. He is not having any significant pain.  Appended Document: requesting lab results call patient to see how he is doing I like to know if he is started prednisone if he hasn't like to see him sometime in next 1-2 weeks.  Appended Document: requesting lab results He did not go on Prednisone and and appt was given.Marland Kitchen

## 2010-02-17 NOTE — Progress Notes (Signed)
Summary: call  Phone Note Call from Patient Call back at Home Phone [phone]   Caller: wife,linda Summary of Call: There for CPX.  Returning call.   Initial call taken by: Rudy Jew, RN,  Jun 17, 2009 10:53 AM  Follow-up for Phone Call        No one here called.  Left message at number left on VM. Follow-up by: Rudy Jew, RN,  Jun 17, 2009 1:51 PM

## 2010-02-17 NOTE — Assessment & Plan Note (Signed)
Summary: 6 month rov/njr   Vital Signs:  Patient profile:   65 year old male Weight:      160 pounds BMI:     23.71 Temp:     98 degrees F oral Pulse rate:   62 / minute Pulse rhythm:   regular Resp:     12 per minute BP sitting:   148 / 76  (left arm) Cuff size:   regular  Vitals Entered By: Gladis Riffle, RN (April 22, 2009 9:11 AM) CC: 6 month rov, states off methotrexate due to liver enzymes--BP 152/78-170/68 at home Is Patient Diabetic? No Comments c/o numbness right shin x 3 weeks   CC:  6 month rov and states off methotrexate due to liver enzymes--BP 152/78-170/68 at home.  History of Present Illness:  Follow-Up Visit      This is a 65 year old man who presents for Follow-up visit.  The patient denies chest pain and palpitations.  Since the last visit the patient notes no new problems or concerns.  The patient reports taking meds as prescribed.  When questioned about possible medication side effects, the patient notes none.  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 Once Daily 2)  Folic Acid 1 Mg Tabs (Folic Acid) .... Once Daily 3)  Tylenol 325 Mg Tabs (Acetaminophen) .... Take Two As Directed As Needed 4)  Advil 200 Mg Tabs (Ibuprofen) .... Take Two As Directed As Needed  Allergies: 1)  Codeine Phosphate (Codeine Phosphate)  Past History:  Past Medical History: Last updated: 09/21/2007 GERD Hypertension Raynaud's, h/o  (doubtful) Gout presumed vasculitis  2008 suicide attempt on steroids  Past Surgical History: Last updated: 09/21/2007 polytrauma-suicide attempt  Family History: Last updated: 09/24/2008 father history of prostate cancer, and hypertension at age 12 mother alive and well at age 30 one brother died in a construction accident father deceased 15 yo - presumed stroke  Social History: Last updated: 05/18/2007 Married Never Smoked Alcohol use-no Regular exercise-yes Former Smoker  Risk Factors: Exercise: yes (08/11/2006)  Risk Factors: Smoking Status: quit (04/22/2009)  Physical Exam  General:  alert and well-developed.   Head:  normocephalic and atraumatic.   Eyes:  pupils equal and pupils round.   Ears:  R ear normal and L ear normal.   Neck:  No deformities, masses, or tenderness noted. Chest Wall:  No deformities, masses, tenderness or gynecomastia noted. Lungs:  Normal respiratory effort, chest expands symmetrically. Lungs are clear to auscultation, no crackles or wheezes. Heart:  normal rate and regular rhythm.   Abdomen:  Bowel sounds positive,abdomen soft and non-tender without masses, organomegaly or hernias noted. Msk:  No deformity or scoliosis noted of thoracic or lumbar spine.   Pulses:  R radial normal and L radial normal.   Neurologic:  cranial nerves II-XII intact and gait normal.     Impression & Recommendations:  Problem # 1:  HYPERTENSION (ICD-401.9)  no meds currently start lisinopril 20 mg by mouth once daily  BP today: 148/76 Prior BP: 148/76 (10/22/2008)  Prior 10 Yr Risk Heart Disease: Not enough information (08/10/2007)  Labs Reviewed: K+: 4.0 (09/21/2007) Creat: : 0.7 (09/21/2007)   Chol: 152 (09/21/2007)   HDL: 34.5 (09/21/2007)   LDL: 90 (09/21/2007)   TG: 137 (09/21/2007)  His updated medication list for this problem includes:    Lisinopril 20 Mg Tabs (Lisinopril) .Marland Kitchen... 1 tablet by mouth daily  Problem # 2:  VASCULITIS (ICD-447.6) ? cause-- sxs resolved note he is  off MTX  Problem # 3:  GERD (ICD-530.81) controlled and off of all meds  Problem # 4:  LATE EFFECTS OF SELF-INFLICTED INJURY (ICD-E959) some anxiety  rare use of clonazepam  Complete Medication List: 1)  Caltrate 600+d 600-400 Mg-unit Tabs (Calcium carbonate-vitamin d) .... Once daily 2)  Folic Acid 1 Mg Tabs (Folic acid) .... Once daily 3)  Tylenol 325 Mg Tabs (Acetaminophen) .... Take two as directed as needed 4)  Advil 200 Mg Tabs (Ibuprofen) .... Take two as directed as needed 5)  Lisinopril 20 Mg Tabs (Lisinopril) .Marland Kitchen.. 1 tablet by mouth daily  Patient Instructions: 1)  see me 6 weeks for CPX Prescriptions: LISINOPRIL 20 MG TABS (LISINOPRIL) 1 tablet by mouth daily  #90 x 3   Entered and Authorized by:   Birdie Sons MD   Signed by:   Birdie Sons MD on 04/22/2009   Method used:   [facility]        [facility]  [place] [address] [facility]       [address]       [place], [place]  [address]       Ph: [phone] or [phone]       Fax: [phone]   RxID:   [id number]

## 2010-02-17 NOTE — Letter (Signed)
Summary: Rheumatology-Dr. Stacey Drain  Rheumatology-Dr. Stacey Drain   Imported By: Maryln Gottron 04/29/2009 14:04:56  _____________________________________________________________________  External Attachment:    Type:   Image     Comment:   External Document

## 2010-02-17 NOTE — Assessment & Plan Note (Signed)
Summary: L FOOT PAIN // RS   Vital Signs:  Patient profile:   65 year old male Weight:      152 pounds BMI:     23.20 Pulse rate:   64 / minute Pulse rhythm:   regular Resp:     12 per minute BP sitting:   130 / 54  (left arm) Cuff size:   regular  Vitals Entered By: Gladis Riffle, RN (September 03, 2009 9:28 AM) CC: c/o left foot between toes--out of clonazepam and would like for prn use Is Patient Diabetic? No Pain Assessment Patient in pain? no        CC:  c/o left foot between toes--out of clonazepam and would like for prn use.  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 Once Daily 2)  Tylenol 325 Mg Tabs (Acetaminophen) .... Take Two As Directed As Needed 3)  Advil 200 Mg Tabs (Ibuprofen) .... Take Two As Directed As Needed 4)  Lisinopril 20 Mg Tabs (Lisinopril) .Marland Kitchen.. 1 Tablet By Mouth Daily 5)  Klonopin 1 Mg Tabs (Clonazepam) .... Take 1 Tablet By Mouth Once A Day As Needed Anxiety  Allergies: 1)  Codeine Phosphate (Codeine Phosphate)  Review of Systems       no fever or chills or redness to the area  Physical Exam  General:  alert and well-developed.   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 Once daily 2)  Tylenol 325 Mg Tabs (Acetaminophen) .... Take two as directed as needed 3)  Advil 200 Mg Tabs (Ibuprofen) .... Take two as directed as needed 4)  Lisinopril 20 Mg Tabs (Lisinopril) .Marland Kitchen.. 1 tablet by mouth daily 5)  Klonopin 1 Mg Tabs (Clonazepam) .... Take 1 tablet by mouth once a day as needed anxiety Prescriptions: KLONOPIN 1 MG TABS (CLONAZEPAM) Take 1 tablet by mouth once a day as needed anxiety  #20 x 3   Entered and Authorized by:   Birdie Sons MD   Signed by:   Birdie Sons MD on 09/03/2009   Method used:   Print then Give to Patient   RxID:   [id number]
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
## 2010-02-17 NOTE — Assessment & Plan Note (Signed)
Summary: FOOT TWISTED/SWEATING AT NIGHT//SLM   Vital Signs:  Patient profile:   65 year old male Height:      68 inches (172.72 cm) Weight:      158 pounds (71.82 kg) Temp:     97.9 degrees F (36.61 degrees C) oral Pulse rate:   64 / minute BP sitting:   162 / 78  (left arm) Cuff size:   regular  Vitals Entered By: Josph Macho RMA (October 31, 2009 8:06 AM)  Serial Vital Signs/Assessments:  Time      Position  BP       Pulse  Resp  Temp     By                     140/72                         Birdie Sons MD  CC: right foot twisted, hurts on side of foot to ankle, swollen ankle X9 days/ sweating at night X8 days/ CF Is Patient Diabetic? No   CC:  right foot twisted, hurts on side of foot to ankle, and swollen ankle X9 days/ sweating at night X8 days/ CF.  History of Present Illness: patient with a history of inflammatory arthralgias. Has seen rheumatology. He comes in after twisting his ankle. Is concerned that his inflammatory arthralgias may be recurring. He states that after his ankle twisted he is developed other joint pains that are similar to his previous symptoms. Previous symptoms have been controlled with steroids. He has been followed by rheumatology before.  Patient denies chest pain, shortness breath, abdominal pain, rashes. No other complaints in a complete review of systems  Current Medications (verified): 1)  Caltrate 600+d 600-400 Mg-Unit Tabs (Calcium Carbonate-Vitamin D) .... Once Daily 2)  Tylenol 325 Mg Tabs (Acetaminophen) .... Take Two As Directed As Needed 3)  Advil 200 Mg Tabs (Ibuprofen) .... Take Two As Directed As Needed 4)  Klonopin 1 Mg Tabs (Clonazepam) .... Take 1 Tablet By Mouth Once A Day As Needed Anxiety 5)  Losartan Potassium 50 Mg Tabs (Losartan Potassium) .... Take 1 Tablet By Mouth Once A Day  Allergies (verified): 1)  ! Ace Inhibitors 2)  Codeine Phosphate (Codeine Phosphate)  Physical Exam  General:  well-developed  well-nourished male in no acute distress. HEENT exam atraumatic, normocephalic symmetric muscles are intact. Neck is supple. Chest clear to auscultation cardiac exam S1-S2 are regular. Abdominal exam thin, pleasant, soft. Extremities with no clubbing cyanosis or edema. He is full range of motion of both ankles. No effusions or tenosynovitis noted.   Impression & Recommendations:  Problem # 1:  VASCULITIS (ICD-447.6)  hx of sees rheumatologist check labs after I review labs we'll consider steroids.  discussed with patient and wife. All their questions were answered. Orders: Venipuncture ([address]) Specimen Handling (60454) TLB-Renal Function Panel (80069-RENAL) TLB-CBC Platelet - w/Differential (85025-CBCD) TLB-CRP-High Sensitivity (C-Reactive Protein) (86140-FCRP) TLB-Sedimentation Rate (ESR) (85652-ESR) Flu Vaccine Consent Questions     Do you have a history of severe allergic reactions to this vaccine? no    Any prior history of allergic reactions to egg and/or gelatin? no    Do you have a sensitivity to the preservative Thimersol? no    Do you have a past history of Guillan-Barre Syndrome? no    Do you currently have an acute febrile illness? no    Have you ever had a severe reaction to latex? no  Vaccine information given and explained to patient? yes    Are you currently pregnant? no    Lot Number:[id number]   Exp Date:07/18/2010   Site Given  Left Deltoid IM  Complete Medication List: 1)  Caltrate 600+d 600-400 Mg-unit Tabs (Calcium carbonate-vitamin d) .... Once daily 2)  Tylenol 325 Mg Tabs (Acetaminophen) .... Take two as directed as needed 3)  Advil 200 Mg Tabs (Ibuprofen) .... Take two as directed as needed 4)  Klonopin 1 Mg Tabs (Clonazepam) .... Take 1 tablet by mouth once a day as needed anxiety 5)  Losartan Potassium 50 Mg Tabs (Losartan potassium) .... Take 1 tablet by mouth once a day  Other Orders: Admin 1st Vaccine (41660) Flu Vaccine 40yrs + (63016)

## 2010-02-19 NOTE — Assessment & Plan Note (Signed)
Summary: knee issues/rcd   Vital Signs:  Patient profile:   65 year old male Weight:      161 pounds Temp:     97.9 degrees F oral BP sitting:   134 / 76  (left arm) Cuff size:   regular  Vitals Entered By: Alfred Levins, CMA (February 03, 2010 9:52 AM) CC: lt knee pain, seen in the ER yesterday   CC:  lt knee pain and seen in the ER yesterday.  History of Present Illness:  patient had acute flare of right knee pain approximately 4 days ago. he states further that he was doing more walking than usual but nothing excessive. No acute injury. He woke up Saturday morning with an acutely swollen, red right knee. Went to the  emergency department. He had a arthrocentesis performed at that time. I reviewed the records and the results. He was empirically started on Keflex. As he feels significantly better since the arthrocentesis. He states that he is stiff in the morning and has some pain with initial walking. No fever, chills.  No other complaints.  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 Ace Inhibitors 2)  Codeine Phosphate (Codeine Phosphate)  Physical Exam  General:   well-developed male no acute distress. HEENT exam atraumatic, normocephalic neck supple. date is normal however he does have a right knee effusion. Pain with full flexion of his right knee. No significant tenderness to palpation.   Impression & Recommendations:  Problem # 1:  KNEE PAIN (ICD-719.46)  patient with acute knee pain and effusion. Repeat x-ray which does document some moderate arthritis. It's also work on that he has a history of a mixed connective tissue disease. I don't think this is related to that. It is also worth noting that fluid evaluation  did not identify any crystals. I think the best treatment right now as an anti-inflammatory drug. Will use ice after walking. If symptoms don't resolve he'll come back in for a steroid injection. If any worsening of symptoms he'll call me. He may need an orthopedic evaluation.   Side effects of medications discussed. His updated medication list for this problem includes:    Aspirin 81 Mg Tbec (Aspirin) ..... One by mouth every day    Meloxicam 15 Mg Tabs (Meloxicam) ..... One by mouth dailywith food for 20 days  Complete Medication List: 1)  Caltrate 600+d 600-400 Mg-unit Tabs (Calcium carbonate-vitamin d) .... Once daily 2)  Klonopin 1 Mg Tabs (Clonazepam) .... Take 1 tablet by mouth once a day as needed anxiety 3)  Losartan Potassium 50 Mg Tabs (Losartan potassium) .... Take 1 tablet by mouth once a day 4)  Aspirin 81 Mg Tbec (Aspirin) .... One by mouth every day 5)  Meloxicam 15 Mg Tabs (Meloxicam) .... One by mouth dailywith food for 20 days  Patient Instructions: 1)  . Prescriptions: MELOXICAM 15 MG TABS (MELOXICAM) one by mouth dailywith food for 20 days  #30 x 0   Entered and Authorized by:   Birdie Sons MD   Signed by:   Birdie Sons MD on 02/03/2010   Method used:   Electronically to        [facility]  [address] (retail)  [address]       [place], [place]  [address]       Ph: [phone] or [phone]       Fax: [phone]   RxID:   [id number]    Orders Added: 1)  Est. Patient Level III [27253]
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
## 2010-02-19 NOTE — Progress Notes (Signed)
  Phone Note Call from Patient Call back at Home Phone [phone]   Caller: Patient Call For: Birdie Sons MD Summary of Call: Spent all day at ER with knee pain, and needs a referral to appropriate specialist...Marland KitchenMarland KitchenMarland Kitchenwas told his diagnosis was prob arthritis.  Initial call taken by: Lynann Beaver CMA AAMA,  February 02, 2010 9:28 AM  Follow-up for Phone Call        refer orthopedics. Follow-up by: Birdie Sons MD,  February 02, 2010 4:30 PM  Additional Follow-up for Phone Call Additional follow up Details #1::        Pt's wife would rather speak to Dr. Cato Mulligan about this referral tomorrow at appt. Additional Follow-up by: Lynann Beaver CMA AAMA,  February 02, 2010 5:06 PM

## 2010-04-09 ENCOUNTER — Encounter: Payer: Self-pay | Admitting: Internal Medicine

## 2010-04-09 ENCOUNTER — Ambulatory Visit (INDEPENDENT_AMBULATORY_CARE_PROVIDER_SITE_OTHER): Payer: 59 | Admitting: Internal Medicine

## 2010-04-09 VITALS — BP 134/90 | HR 76 | Temp 98.0°F | Wt 163.0 lb

## 2010-04-09 DIAGNOSIS — I10 Essential (primary) hypertension: Secondary | ICD-10-CM

## 2010-04-09 DIAGNOSIS — I493 Ventricular premature depolarization: Secondary | ICD-10-CM

## 2010-04-09 DIAGNOSIS — R002 Palpitations: Secondary | ICD-10-CM

## 2010-04-09 NOTE — Progress Notes (Signed)
  Subjective:    Kevin Perez is a 65 y.o. male who presents with palpitations. The symptoms are mild, occur intermittently, and last 5 seconds per episode. They tend to occur while at rest. Cardiac risk factors include: advanced age (older than 72 for men, 63 for women). Aggravating factors: none. Relieving factors: none. Associated symptoms: none. Patient denies: abdominal pain, calf pain, chest pain, cough, dizziness and fatigue.  The following portions of the patient's history were reviewed and updated as appropriate: allergies, current medications, past family history, past medical history, past social history, past surgical history and problem list.  Review of Systems   patient denies chest pain, shortness of breath, orthopnea. Denies lower extremity edema, abdominal pain, change in appetite, change in bowel movements. Patient denies rashes, musculoskeletal complaints. No other specific complaints in a complete review of systems.                   Objective:    well-developed well-nourished male in no acute distress. HEENT exam atraumatic, normocephalic, neck supple without jugular venous distention. Chest clear to auscultation cardiac exam S1-S2 are regular. Abdominal exam overweight with bowel sounds, soft and nontender. Extremities no edema. Neurologic exam is alert with a normal gait.   Cardiographics ECG: reviewed  Assessment:    Palpitations   Plan:    i suspect these are pvcs and do not need any further evaluation Reviewed EKG Discussed the ekg and the plan for "watchful waiting" with the pt and his wife Call for any concerns Follow up in prn .

## 2010-05-06 ENCOUNTER — Other Ambulatory Visit: Payer: Self-pay | Admitting: *Deleted

## 2010-05-06 MED ORDER — CLONAZEPAM 1 MG PO TABS: 1.0000 mg | ORAL_TABLET | Freq: Every day | ORAL | Status: DC | PRN
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
## 2010-06-02 NOTE — Assessment & Plan Note (Signed)
[facility]                                 ON-CALL NOTE   UMAR, PATMON                     MRN:          [id number]  DATE:07/10/2007                            DOB:          [birth year]    HISTORY:  Wife calls.  The patient is having some difficulty urinating,  initially thought to be unable to pass his urine, however, on further  questioning, it was frequency and pain without associated fever,  abdominal pain, or shakes.  He has recently gotten out of behavioral  health on medications and wife had thought that this was a panic attack,  but she is not sure.  He is on Prozac 40 mg, Zyprexa 5, and Ambien at  night.  During his inpatient stay, he may have also been on Ativan.  There is no other medications he has taken.  He is not having a problem  with constipation, but he does feel panic.  I have recommend he be seen  by Dr. Cato Mulligan tomorrow.  Discussed with wife alarm symptoms, such as, no  voiding for 12 hours with abdominal pain, fever, rigors, or severe pain,  he can be seen in the emergency room in the meantime.     Neta Mends. Panosh, MD  Electronically Signed    WKP/MedQ  DD: 07/10/2007  DT: 07/11/2007  Job #: [id number]
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
## 2010-06-02 NOTE — Discharge Summary (Signed)
Kevin Perez, Kevin Perez              ACCOUNT NO.:  [id number]   MEDICAL RECORD NO.:  [id number]          PATIENT TYPE:  INP   LOCATION:  5037                         FACILITY:  [facility]   PHYSICIAN:  Valetta Mole. Swords, MD    DATE OF BIRTH:  1945/05/26   DATE OF ADMISSION:  05/18/2007  DATE OF DISCHARGE:  05/20/2007                               DISCHARGE SUMMARY   DISCHARGE DIAGNOSES:  1. Vasculitis, unclear etiology.  2. Hypertension.  3. Fever of unknown origin likely secondary to #1.  4. Gastroesophageal reflux disease.  5. Hip pain likely secondary to #1.   DISCHARGE MEDICATIONS:  1. Prednisone 60 mg p.o. daily until changed by rheumatologist or Dr.      Cato Mulligan.  2. Amlodipine 5 mg p.o. daily.  3. Aspirin 81 mg p.o. daily.  4. Lorazepam 0.5 mg p.o. q.h.s. p.r.n.  5. Zantac 150 mg p.o. daily.   CONDITION ON DISCHARGE:  Improved.   HOSPITAL CONSULTANTS:  ENT and General Surgery for temporal artery  biopsy, not performed.   HOSPITAL PROCEDURE:  CT of the abdomen and pelvis (performed on May 16, 2007, prior to admission) were essentially unremarkable.   HOSPITAL LABORATORIES:  Acute hepatitis panel negative.  C4 complement  normal at 30 (normal 16-47).  C3 complement level mildly elevated 234  (normal 88-201).   Sedimentation rate elevated at 117.  CBC with a white count of 6.8,  hemoglobin 11.7, platelet count 316,000.   HOSPITAL COURSE:  The patient admitted to the hospital service on May 18, 2007.  See Dr. Amador Cunas note for details.  The patient was  admitted with FUO, thought to be vasculitis.  The patient treated  aggressively with steroids.  Symptoms resolved quickly.  At the time of  discharge, the patient was feeling great.  Fevers had resolved.  He  kiid not had any chills, fevers, or night sweats.  Hip pain has  completely resolved.  The patient  eventually was evaluated for temporal artery biopsy.  The patient  refused at this time.  I told him this may be  important, but we can  decide that as an outpatient have him see a rheumatologist.   The patient will call me and have an appointment with me or Dr. Trenda Moots  on Tuesday May 23, 2007.      Bruce Rexene Edison Swords, MD  Electronically Signed     BHS/MEDQ  D:  05/20/2007  T:  05/20/2007  Job:  [id number]
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
## 2010-06-02 NOTE — Consult Note (Signed)
VASCULAR SURGERY CONSULTATION   Kevin, Perez  DOB:  December 10, 1945                                       06/13/2007  CHART#:[id number]   I saw the patient in the office today in consultation for bilateral  tubal artery biopsy.  He was referred by Dr. Kellie Perez.  This is a  pleasant 65 year old gentleman who is followed by Dr. Cato Perez.  He has  had some problems with pain in the right eye and also pain in both hips.  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  For this  reason, we were asked to perform bilateral temporal artery biopsies.  Of  note he has had a CRP of 185 and an ESR 117.  Though he was placed on  prednisone and his symptoms are not improved after this.   PAST MEDICAL HISTORY:  Significant for hypertension.  He denies any  history of diabetes, hypercholesterolemia, history of previous  myocardial infarction, or history congestive heart failure.  He does  have a history of reflux.  He has had bilateral inguinal hernia repairs  in the mid 1980s.   FAMILY HISTORY:  There is no history of premature cardiovascular  disease.   SOCIAL HISTORY:  He is married and has 2 children.   REVIEW OF SYSTEMS AND MEDICATIONS:  Are documented on medical history  form in his chart.   PHYSICAL EXAMINATION:  This is a pleasant 65 year old gentleman who  appears his stated age.  Blood pressure 133/76 heart rate is 70.  I do  not detect any carotid or supraclavicular bruits.  Temporal artery  pulses are palpable bilaterally with no tenderness in the temporal  region.  Lungs are clear bilaterally to auscultation.  Cardiac exam he  has a regular rate and rhythm.  Neurologic:  Exam is nonfocal.   We will reviewed the procedure of bilateral temple artery biopsy.  We  have discussed the indications and potential complications including a  small risk of bleeding.  All of his questions were  answered and he is  agreeable to proceed.  The surgery has been scheduled for 06/16/2007.   Kevin Perez. Kevin Perez, M.D.  Electronically Signed  CSD/MEDQ  D:  06/13/2007  T:  06/14/2007  Job:  [id number]   cc:   Kevin Perez, M.D.  Kevin Rexene Edison Swords, MD
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
## 2010-06-05 NOTE — Progress Notes (Signed)
[facility]                        PERIPHERAL VASCULAR OFFICE NOTE   SASHA, RUETH                     MRN:          [id number]  DATE:03/17/2006                            DOB:          [birth year]    PRIMARY CARE PHYSICIAN:  Valetta Mole. Swords, MD   HISTORY OF PRESENT ILLNESS:  Mr. Coppola is a 65 year old gentleman  without prior history of vascular disease.  He has had hypertension for  two years.  Beginning approximately one month ago, he has noticed  numbness, pain and discoloration of the fourth and fifth fingers of his  left hand.  He first noted the numbness and some purplish discoloration.  He has also noticed white discoloration if he raises them above his  hands.  Initially, he thought that exposure to cold was helpful.  Now,  he thinks that he develops increased discomfort with cold exposure.  He  has no similar symptoms in any other fingers of either hand and none in  his feet.   Mr. 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  He  came to this new role in his job at approximately the same time these  symptoms started.  He has subsequently quit it.  He also notes that he  injured the fourth and fifth fingers of the left hand in a car door  handle at approximately the same time his symptoms began.  He has not  been using a jack hammer or other device that might have caused  repetitive injury.  He has never had frost bite.   He initially saw Dr. Cato Mulligan for this problem.  Dr. Cato Mulligan switched him  from Atenolol to Norvasc, concern for Raynaud's phenomenon.  Mr.  Bottomley subsequently developed a pustule on the distal lateral cuticle  of his left hand.  He presented to an urgent care for this and was begun  on Omnicef.  He has not had any tissue loss.  He says his finger looks  better after starting the antibiotic.   PAST MEDICAL HISTORY:  1. Hypertension x2 years.  2. Inguinal hernia repair in 1998.   ALLERGIES:  CODEINE CAUSES NAUSEA.   MEDICATIONS:  1. Norvasc 5 mg daily.  2. Diclofenac 75 mg b.i.d.  3. Zantac 150 mg daily.  4. Percocet p.r.n.  5. Omnicef 300 mg b.i.d. on the fifth of a five day course.  6. Gout medicine that he cannot recall.   SOCIAL HISTORY:  He previously worked in [employer].  He quit his job last  week out of concern that it was contributing to his finger troubles.  He  is married with two grown children.  He previously chewed tobacco but  quit in 1991.  He smoked briefly in his 59's.  He drinks minimal  alcohol.  Denies illicit drug use.   FAMILY HISTORY:  Father is alive with prostate cancer and hypertension  at 43.  Mother is alive and well at 37.  Brother  died in a construction  accident.   REVIEW OF SYSTEMS:  Notable for wearing glasses.  Occasional diarrhea.  Otherwise, negative in detail except as above.   PHYSICAL EXAMINATION:  GENERAL:  Generally well-appearing, in no  distress.  He is extremely anxious and remains so despite my attempts to  reassure him.  VITAL SIGNS:  Heart rate 76, blood pressure 174/80 on the right and  190/78 on the left.  He is 5 feet 10 inches tall and weighs 171 pounds.  HEENT:  Normal.  SKIN:  Normal with the exception of cyanosis on the left fourth finger  and approximately a 3 mm pustule at the distal cuticle on the lateral  portion of the left fourth finger.  There is no surrounding erythema or  edema.  There is no skin breakdown.  Fourth and fifth fingers blanch  with raising them over his hip.  Capillary refill is sluggish in both  the fourth and fifth fingers but normal in all other fingers.  Examination with pulse oximetry demonstrates only minimally impaired  pulse amplitude in the fourth and fifth fingers compared to all other  fingers on both hands.  These other fingers all  appear normal, and the  fourth and fifth fingers on the left hand only minimally abnormal.  Pulse oximetry of 96% with good pulse amplitude is seen in all fingers  including those of the fourth and fifth of the left hand.  MUSCULOSKELETAL:  Normal.  RESPIRATORY:  Normal.  Lungs are clear to auscultation.  HEART:  He has a nondisplaced point of maximal cardiac impulse.  There  is a regular rate and rhythm without murmurs, rubs or gallops.  ABDOMEN:  Soft, nondistended, nontender.  No hepatosplenomegaly.  Bowel  sounds are normal.  EXTREMITIES:  Warm without cyanosis, clubbing, edema or ulceration.  Carotid pulses 2+ bilaterally without bruit.  Femoral pulses 2+  bilaterally without bruits.  DP/PT pulses 2+ bilaterally.  Radial pulses  are 2+ bilaterally.  Ulnar pulse is 2+ in the left arm as well.  NEUROLOGICAL:  He is alert and oriented x3.  Quite anxious.  Affect  otherwise normal.  Cranial nerves II-XII intact.  Strength and sensation  normal in both hands.   IMPRESSION/RECOMMENDATIONS:  1. Mr. Panas clearly has impaired perfusion to the left fourth and      fifth fingers.  By my history, this seems to most likely relate to      the trauma that he suffered with the car door.  In addition, he      appears to have a cuticle infection there.  He does not have any      ulceration and it does not seem that the cuticle infection is a      result of ischemia.  Instead, it seems to coexist with the      ischemia.  Though his perfusion is reasonable to the fourth and      fifth fingers, it does appear to be less than that to his other      fingers.  I will therefore prolong his course of oral antibiotics.      I have asked him to follow up with Dr. Cato Mulligan to make sure that      this is healing next week.   I think it unlikely that this is Raynaud's phenomenon given the absence of symptoms in his other digits.  Instead,  I think that he traumatized  a branch of the palmar branch.  He  clearly does have some flow to the  finger as demonstrated by the good signal on pulse-oximetery.  I expect  this to continue to improve as he either recanalizes the injured vessels  or develops collaterals.  I will see him back in three weeks to assess  for continued improvement.  In the interim, we continue with the Norvasc  as he may have some dependence upon collaterals to the distal portion of  the fourth and fifth fingers of the left hand.  1. Hypertension:  Blood pressure is quite elevated today.  I have      asked him to follow up with Dr. Cato Mulligan because it may be more      elevated because he is nervous today.  We will therefore make no      changes in his medications.  I will see him back in three week's      time.     Salvadore Farber, MD  Electronically Signed    WED/MedQ  DD: 03/17/2006  DT: 03/17/2006  Job #: [id number]   cc:   Valetta Mole. Swords, MD
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
## 2010-07-17 ENCOUNTER — Other Ambulatory Visit (INDEPENDENT_AMBULATORY_CARE_PROVIDER_SITE_OTHER): Payer: 59

## 2010-07-17 DIAGNOSIS — Z0000 Encounter for general adult medical examination without abnormal findings: Secondary | ICD-10-CM

## 2010-07-17 LAB — CBC WITH DIFFERENTIAL/PLATELET
Eosinophils Relative: 5.2 % — ABNORMAL HIGH (ref 0.0–5.0)
HCT: 46.5 % (ref 39.0–52.0)
Hemoglobin: 16.1 g/dL (ref 13.0–17.0)
Lymphs Abs: 1.5 10*3/uL (ref 0.7–4.0)
Monocytes Relative: 8.8 % (ref 3.0–12.0)
Neutro Abs: 3 10*3/uL (ref 1.4–7.7)
RDW: 13.7 % (ref 11.5–14.6)
WBC: 5.3 10*3/uL (ref 4.5–10.5)

## 2010-07-17 LAB — BASIC METABOLIC PANEL
GFR: 72.9 mL/min (ref >60.00)
Glucose, Bld: 86 mg/dL (ref 70–99)
Potassium: 4.2 mEq/L (ref 3.5–5.1)
Sodium: 138 mEq/L (ref 135–145)

## 2010-07-17 LAB — HEPATIC FUNCTION PANEL
ALT: 22 U/L (ref 0–53)
AST: 26 U/L (ref 0–37)
Albumin: 4.3 g/dL (ref 3.5–5.2)

## 2010-07-17 LAB — TSH: TSH: 1.46 u[IU]/mL (ref 0.35–5.50)

## 2010-07-17 LAB — PSA: PSA: 1.38 ng/mL (ref 0.10–4.00)

## 2010-07-17 LAB — LIPID PANEL: Cholesterol: 200 mg/dL (ref 0–200)

## 2010-07-17 LAB — POCT URINALYSIS DIPSTICK
Glucose, UA: NEGATIVE
Ketones, UA: NEGATIVE
Spec Grav, UA: 1.02

## 2010-07-23 ENCOUNTER — Ambulatory Visit (INDEPENDENT_AMBULATORY_CARE_PROVIDER_SITE_OTHER): Payer: Medicare Other | Admitting: Internal Medicine

## 2010-07-23 ENCOUNTER — Encounter: Payer: Self-pay | Admitting: Internal Medicine

## 2010-07-23 VITALS — BP 142/74 | HR 72 | Temp 98.5°F | Ht 68.25 in | Wt 164.0 lb

## 2010-07-23 DIAGNOSIS — Z0000 Encounter for general adult medical examination without abnormal findings: Secondary | ICD-10-CM

## 2010-07-23 DIAGNOSIS — Z23 Encounter for immunization: Secondary | ICD-10-CM

## 2010-07-23 DIAGNOSIS — I776 Arteritis, unspecified: Secondary | ICD-10-CM

## 2010-07-23 DIAGNOSIS — E785 Hyperlipidemia, unspecified: Secondary | ICD-10-CM

## 2010-07-23 DIAGNOSIS — I10 Essential (primary) hypertension: Secondary | ICD-10-CM

## 2010-07-23 NOTE — Assessment & Plan Note (Signed)
No need for treatment

## 2010-07-23 NOTE — Assessment & Plan Note (Signed)
No recurrent symptoms.

## 2010-07-23 NOTE — Assessment & Plan Note (Signed)
Well controlled. Continue current medications  

## 2010-07-23 NOTE — Progress Notes (Signed)
Subjective:    Kevin Perez is a 65 y.o. male who presents for Medicare Initial preventive examination.   Preventive Screening-Counseling & Management  Tobacco History  Smoking status  . Former Smoker  Smokeless tobacco  . Not on file    Problems Prior to Visit   Current Problems (verified) Patient Active Problem List  Diagnoses  . HYPERLIPIDEMIA---no meds  . HYPERTENSION---tolerating meds without dificulty  . RAYNAUD'S SYNDROME--no recent flares  . VASCULITIS---has been quiescent   Medications Prior to Visit Current Outpatient Prescriptions on File Prior to Visit  Medication Sig Dispense Refill  . aspirin 81 MG tablet Take 81 mg by mouth daily.        . Calcium Carbonate-Vitamin D (CALTRATE 600+D) 600-400 MG-UNIT per tablet Take 1 tablet by mouth daily.        . clonazePAM (KLONOPIN) 1 MG tablet Take 1 tablet (1 mg total) by mouth daily as needed.  20 tablet  2  . losartan (COZAAR) 50 MG tablet Take 50 mg by mouth daily.          Current Medications (verified) Current Outpatient Prescriptions  Medication Sig Dispense Refill  . aspirin 81 MG tablet Take 81 mg by mouth daily.        . Calcium Carbonate-Vitamin D (CALTRATE 600+D) 600-400 MG-UNIT per tablet Take 1 tablet by mouth daily.        . clonazePAM (KLONOPIN) 1 MG tablet Take 1 tablet (1 mg total) by mouth daily as needed.  20 tablet  2  . losartan (COZAAR) 50 MG tablet Take 50 mg by mouth daily.           Allergies (verified) Ace inhibitors and Codeine phosphate   PAST HISTORY  Family History Family History  Problem Relation Age of Onset  . Hypertension Father   . Prostate cancer Father 59  . Stroke Father     Social History History  Substance Use Topics  . Smoking status: Former Games developer  . Smokeless tobacco: Not on file  . Alcohol Use: No    Are there smokers in your home (other than you)?  No  Risk Factors Current exercise habits: Home exercise routine includes walking 1 hrs per days.    Dietary issues discussed: follows normal diet   Cardiac risk factors: advanced age (older than 59 for men, 63 for women).  Depression Screen (Note: if answer to either of the following is "Yes", a more complete depression screening is indicated)   Q1: Over the past two weeks, have you felt down, depressed or hopeless? No  Q2: Over the past two weeks, have you felt little interest or pleasure in doing things? No   Activities of Daily Living In your present state of health, do you have any difficulty performing the following activities?:  Driving? No Managing money?  No Feeding yourself? No Getting from bed to chair? No Climbing a flight of stairs? No Preparing food and eating?: No Bathing or showering? No Getting dressed: No Getting to the toilet? No Using the toilet:No Moving around from place to place: No In the past year have you fallen or had a near fall?:No    Hearing Difficulties: No Do you often ask people to speak up or repeat themselves? No Do you experience ringing or noises in your ears? No Do you have difficulty understanding soft or whispered voices? No   Do you feel that you have a problem with memory? No  He has no concerns with falls or home  safety 1.    Indicate any recent Medical Services you may have received from other than [facility] providers in the past year (date may be approximate).  Immunization History  Administered Date(s) Administered  . Influenza Whole 10/31/2009  . Td 06/17/2009    Screening Tests Health Maintenance  Topic Date Due  . Colonoscopy  05/24/1995  . Zostavax  05/23/2005  . Pneumococcal Polysaccharide Vaccine Age 64 And Over  05/24/2010  . Influenza Vaccine  10/19/2010  . Tetanus/tdap  06/18/2019    All answers were reviewed with the patient and necessary referrals were made:  Judie Petit, MD   07/23/2010   History reviewed: allergies, current medications, past family history, past medical history, past social history, past  surgical history and problem list  Review of Systems  patient denies chest pain, shortness of breath, orthopnea. Denies lower extremity edema, abdominal pain, change in appetite, change in bowel movements. Patient denies rashes, musculoskeletal complaints. No other specific complaints in a complete review of systems.    Objective:      Blood pressure 142/74, pulse 72, temperature 98.5 F (36.9 C), temperature source Oral, height 5' 8.25" (1.734 m), weight 164 lb (74.39 kg). Body mass index is 24.75 kg/(m^2).  Well-developed male in no acute distress. HEENT exam atraumatic, normocephalic, extraocular muscles are intact. Conjunctivae are pink without exudate. Neck is supple without lymphadenopathy, thyromegaly, jugular venous distention. Chest is clear to auscultation without increased work of breathing. Cardiac exam S1-S2 are regular. The PMI is normal. No significant murmurs or gallops. Abdominal exam active bowel sounds, soft, nontender. No abdominal bruits. Extremities no clubbing cyanosis or edema. Peripheral pulses are normal without bruits. Neurologic exam alert and oriented without any motor or sensory deficits.      Assessment:     Well visit---health maint UTD     Plan:     During the course of the visit the patient was educated and counseled about appropriate screening and preventive services including:    Pneumococcal vaccine   Influenza vaccine  Td vaccine  Prostate cancer screening Colorectal cancer screening   Patient Instructions (the written plan) was given to the patient.  Medicare Attestation I have personally reviewed: The patient's medical and social history Their use of alcohol, tobacco or illicit drugs Their current medications and supplements The patient's functional ability including ADLs,fall risks, home safety risks, cognitive, and hearing and visual impairment Diet and physical activities Evidence for depression or mood disorders  The  patient's weight, height, BMI, has been recorded in the chart.  I have made referrals, counseling, and provided education to the patient based on review of the above and I have provided the patient with a written personalized care plan for preventive services.     Judie Petit, MD   07/23/2010

## 2010-07-24 ENCOUNTER — Encounter: Payer: Self-pay | Admitting: Internal Medicine

## 2010-09-29 ENCOUNTER — Other Ambulatory Visit: Payer: Self-pay | Admitting: *Deleted

## 2010-09-29 MED ORDER — LOSARTAN POTASSIUM 50 MG PO TABS: 50.0000 mg | ORAL_TABLET | Freq: Every day | ORAL | Status: DC

## 2010-10-05 ENCOUNTER — Ambulatory Visit (INDEPENDENT_AMBULATORY_CARE_PROVIDER_SITE_OTHER): Payer: Medicare Other | Admitting: Internal Medicine

## 2010-10-05 ENCOUNTER — Encounter: Payer: Self-pay | Admitting: Internal Medicine

## 2010-10-05 VITALS — BP 118/78 | Temp 98.4°F | Wt 165.0 lb

## 2010-10-05 DIAGNOSIS — L03039 Cellulitis of unspecified toe: Secondary | ICD-10-CM

## 2010-10-05 MED ORDER — AMOXICILLIN-POT CLAVULANATE 875-125 MG PO TABS: 1.0000 | ORAL_TABLET | Freq: Two times a day (BID) | ORAL | Status: AC

## 2010-10-05 NOTE — Progress Notes (Signed)
  Subjective:    Patient ID: Kevin Perez, male    DOB: 1945/04/13, 65 y.o.   MRN: [id number]  HPI  65 year old patient who presents with a chief complaint of pain and swelling involving his left great toe. He does have a history of Raynaud's syndrome. He has had some paronychia involving the medial aspect of the great toe. Once pain and redness improved he has been attempting to debride the area.  There has been no drainage fever or other systemic complaints. He has treated hypertension    Review of Systems  Skin: Positive for wound.       Objective:   Physical Exam  Constitutional: He appears well-developed and well-nourished. No distress.       Temperature 98.4. Blood pressure 118/78  Skin:       The patient had a chronic paronychia involving the medial aspect of his left great toe;  the medial aspect of the nail was cut quite short          Assessment & Plan:   Chronic paronychia of the toe. Local 1 care discussed. He will also 4 times daily and be placed on antibiotic therapy he will attempt to allow the nail to grow out and avoid further ingrown toenails. He will call if unimproved

## 2010-10-05 NOTE — Patient Instructions (Signed)
Soak  your  foot 4 times daily  Avoid overuse  Take your antibiotic as prescribed until ALL of it is gone, but stop if you develop a rash, swelling, or any side effects of the medication.  Contact our office as soon as possible if  there are side effects of the medication.   Call or return to clinic prn if these symptoms worsen or fail to improve as anticipated.

## 2010-10-06 ENCOUNTER — Telehealth: Payer: Self-pay | Admitting: Internal Medicine

## 2010-10-06 MED ORDER — CEPHALEXIN 500 MG PO CAPS: 500.0000 mg | ORAL_CAPSULE | Freq: Two times a day (BID) | ORAL | Status: AC

## 2010-10-06 NOTE — Telephone Encounter (Signed)
Please advise 

## 2010-10-06 NOTE — Telephone Encounter (Signed)
Pt called again to check on medication

## 2010-10-06 NOTE — Telephone Encounter (Signed)
Pt aware - med efile to pharmacy

## 2010-10-06 NOTE — Telephone Encounter (Signed)
Change antibiotic to cephalexin 500 mg to take twice a day with meals #20 Discontinue Augmentin

## 2010-10-06 NOTE — Telephone Encounter (Signed)
Pt received a Rx for Amoxtr-kclv 875 125mg  tabs yesterday and is having a bad reaction to it. Pt did not sleep at all last night and is sick to stomach. Please contact pt.

## 2010-10-13 LAB — PROTIME-INR: Prothrombin Time: 14.2

## 2010-10-13 LAB — C3 COMPLEMENT: C3 Complement: 234 — ABNORMAL HIGH

## 2010-10-13 LAB — DIFFERENTIAL
Basophils Relative: 0
Eosinophils Absolute: 0.1
Eosinophils Relative: 1
Monocytes Relative: 9
Neutrophils Relative %: 69

## 2010-10-13 LAB — CRYOGLOBULIN

## 2010-10-13 LAB — CBC
Hemoglobin: 11.7 — ABNORMAL LOW
MCHC: 33.3
RBC: 4.18 — ABNORMAL LOW
WBC: 6.8

## 2010-10-13 LAB — HEPATITIS PANEL, ACUTE
Hep B C IgM: NEGATIVE
Hepatitis B Surface Ag: NEGATIVE

## 2010-10-13 LAB — SEDIMENTATION RATE: Sed Rate: 117 — ABNORMAL HIGH

## 2010-10-14 LAB — BASIC METABOLIC PANEL
BUN: 14
Calcium: 7.4 — ABNORMAL LOW
GFR calc non Af Amer: >60
GFR calc non Af Amer: >60
Glucose, Bld: 179 — ABNORMAL HIGH
Potassium: 3.8
Sodium: 138
Sodium: 139

## 2010-10-14 LAB — POCT I-STAT 3, ART BLOOD GAS (G3+)
Bicarbonate: 25 — ABNORMAL HIGH
O2 Saturation: 100
Operator id: 128731
Operator id: 297641
pCO2 arterial: 40.2
pH, Arterial: 7.312 — ABNORMAL LOW
pH, Arterial: 7.399
pO2, Arterial: 146 — ABNORMAL HIGH

## 2010-10-14 LAB — POCT I-STAT 7, (LYTES, BLD GAS, ICA,H+H)
Bicarbonate: 21.1
Bicarbonate: 21.9
Calcium, Ion: 1.06 — ABNORMAL LOW
Hemoglobin: 6.8 — CL
O2 Saturation: 100
O2 Saturation: 99
Patient temperature: 34.4
TCO2: 22
TCO2: 23
pCO2 arterial: 35.7
pCO2 arterial: 46.9 — ABNORMAL HIGH
pH, Arterial: 7.261 — ABNORMAL LOW
pO2, Arterial: 371 — ABNORMAL HIGH

## 2010-10-14 LAB — COMPREHENSIVE METABOLIC PANEL
AST: 17
Alkaline Phosphatase: 36 — ABNORMAL LOW
BUN: 6
CO2: 30
Chloride: 104
Creatinine, Ser: 0.78
GFR calc Af Amer: >60
GFR calc non Af Amer: >60
Total Bilirubin: 0.8

## 2010-10-14 LAB — TYPE AND SCREEN: ABO/RH(D): B POS

## 2010-10-14 LAB — POCT I-STAT 4, (NA,K, GLUC, HGB,HCT)
Operator id: 156951
Potassium: 4.2
Sodium: 138

## 2010-10-14 LAB — CBC
HCT: 25.4 — ABNORMAL LOW
HCT: 28.6 — ABNORMAL LOW
HCT: 30.2 — ABNORMAL LOW
HCT: 35.1 — ABNORMAL LOW
Hemoglobin: 10.4 — ABNORMAL LOW
Hemoglobin: 13.4
Hemoglobin: 8.7 — ABNORMAL LOW
MCHC: 33.4
MCHC: 34.2
MCV: 85.8
MCV: 87.4
Platelets: 102 — ABNORMAL LOW
Platelets: 141 — ABNORMAL LOW
RBC: 3.28 — ABNORMAL LOW
RBC: 4.09 — ABNORMAL LOW
RDW: 16 — ABNORMAL HIGH
RDW: 16.1 — ABNORMAL HIGH
WBC: 10.8 — ABNORMAL HIGH
WBC: 13 — ABNORMAL HIGH
WBC: 17.9 — ABNORMAL HIGH

## 2010-10-14 LAB — POCT I-STAT, CHEM 8
BUN: 17
Calcium, Ion: 1.17
Chloride: 99
Creatinine, Ser: 1.5
Sodium: 135

## 2010-10-14 LAB — ABO/RH: ABO/RH(D): B POS

## 2010-10-15 LAB — URINALYSIS, ROUTINE W REFLEX MICROSCOPIC
Bilirubin Urine: NEGATIVE
Glucose, UA: NEGATIVE
Hgb urine dipstick: NEGATIVE
Ketones, ur: NEGATIVE
Nitrite: NEGATIVE
Nitrite: NEGATIVE
Protein, ur: NEGATIVE
Specific Gravity, Urine: 1.028
Specific Gravity, Urine: 1.004 — ABNORMAL LOW
Urobilinogen, UA: 0.2
pH: 6

## 2010-10-15 LAB — SEDIMENTATION RATE: Sed Rate: 74 — ABNORMAL HIGH

## 2010-10-15 LAB — CBC
HCT: 33.3 — ABNORMAL LOW
MCHC: 34.2
MCV: 85.7
Platelets: 242
Platelets: 282
RBC: 3.72 — ABNORMAL LOW
RDW: 15.8 — ABNORMAL HIGH
RDW: 15.8 — ABNORMAL HIGH
WBC: 8.9

## 2010-10-15 LAB — BASIC METABOLIC PANEL
Chloride: 96
GFR calc non Af Amer: >60
Potassium: 3.2 — ABNORMAL LOW
Sodium: 132 — ABNORMAL LOW

## 2010-10-15 LAB — T3, FREE: T3, Free: 2.7 (ref 2.3–4.2)

## 2010-10-15 LAB — CULTURE, BLOOD (ROUTINE X 2): Culture: NO GROWTH

## 2010-10-15 LAB — COMPREHENSIVE METABOLIC PANEL
AST: 13
Albumin: 2.8 — ABNORMAL LOW
Alkaline Phosphatase: 76
BUN: 10
Chloride: 98
GFR calc Af Amer: >60
Potassium: 4.5
Total Protein: 6.8

## 2010-10-15 LAB — T4, FREE: Free T4: 1.16

## 2010-10-15 LAB — CORTISOL-AM, BLOOD: Cortisol - AM: 25.2 — ABNORMAL HIGH

## 2010-10-15 LAB — RPR: RPR Ser Ql: NONREACTIVE

## 2010-10-15 LAB — VITAMIN B12: Vitamin B-12: 275 (ref 211–911)

## 2010-10-15 LAB — FOLATE RBC: RBC Folate: 1180 — ABNORMAL HIGH

## 2010-10-15 LAB — TSH: TSH: 1.337

## 2010-11-02 LAB — I-STAT 8, (EC8 V) (CONVERTED LAB)
Acid-Base Excess: 4 — ABNORMAL HIGH
Chloride: 102
HCT: 45
Hemoglobin: 15.3
Operator id: 146091
Potassium: 4
Sodium: 137
pCO2, Ven: 54.1 — ABNORMAL HIGH

## 2010-11-02 LAB — POCT I-STAT CREATININE: Creatinine, Ser: 1.1

## 2011-01-21 ENCOUNTER — Ambulatory Visit: Payer: 59 | Admitting: Internal Medicine

## 2011-01-28 ENCOUNTER — Ambulatory Visit (INDEPENDENT_AMBULATORY_CARE_PROVIDER_SITE_OTHER): Payer: Medicare Other | Admitting: Internal Medicine

## 2011-01-28 ENCOUNTER — Encounter: Payer: Self-pay | Admitting: Internal Medicine

## 2011-01-28 DIAGNOSIS — I776 Arteritis, unspecified: Secondary | ICD-10-CM

## 2011-01-28 DIAGNOSIS — E785 Hyperlipidemia, unspecified: Secondary | ICD-10-CM

## 2011-01-28 DIAGNOSIS — I10 Essential (primary) hypertension: Secondary | ICD-10-CM | POA: Diagnosis not present

## 2011-01-28 NOTE — Assessment & Plan Note (Signed)
No recurrent sxs No meds at this time

## 2011-01-28 NOTE — Assessment & Plan Note (Signed)
Could be better but i don't think any reason for statin at this time

## 2011-01-28 NOTE — Assessment & Plan Note (Signed)
Fair control Continue meds 

## 2011-01-28 NOTE — Progress Notes (Signed)
Patient ID: Kevin Perez, male   DOB: 1945-12-12, 66 y.o.   MRN: [id number] Patient Active Problem List  Diagnoses  . HYPERLIPIDEMIA---no need for meds  . HYPERTENSION--tolerating losartan  .   Marland Kitchen VASCULITIS---no recurrence of sxs  . GERD--no sxs and not taking meds   Past Medical History  Diagnosis Date  . HYPERLIPIDEMIA 06/17/2009  . HYPERTENSION 07/21/2006  . Raynaud's syndrome 07/21/2006  . VASCULITIS 05/23/2007  . GERD 07/21/2006  . Suicide attempt     History   Social History  . Marital Status: Married    Spouse Name: N/A    Number of Children: N/A  . Years of Education: N/A   Occupational History  . Not on file.   Social History Main Topics  . Smoking status: Former Games developer  . Smokeless tobacco: Former Neurosurgeon  . Alcohol Use: No  . Drug Use: No  . Sexually Active: Not on file   Other Topics Concern  . Not on file   Social History Narrative  . No narrative on file    No past surgical history on file.  Family History  Problem Relation Age of Onset  . Hypertension Father   . Prostate cancer Father 35  . Stroke Father     Allergies  Allergen Reactions  . Augmentin Nausea And Vomiting  . Ace Inhibitors     REACTION: cough  . Codeine Phosphate     REACTION: vomiting    Current Outpatient Prescriptions on File Prior to Visit  Medication Sig Dispense Refill  . aspirin 81 MG tablet Take 81 mg by mouth daily.        . clonazePAM (KLONOPIN) 1 MG tablet Take 1 tablet (1 mg total) by mouth daily as needed.  20 tablet  2  . losartan (COZAAR) 50 MG tablet Take 1 tablet (50 mg total) by mouth daily.  30 tablet  5  . Calcium Carbonate-Vitamin D (CALTRATE 600+D) 600-400 MG-UNIT per tablet Take 1 tablet by mouth daily.           patient denies chest pain, shortness of breath, orthopnea. Denies lower extremity edema, abdominal pain, change in appetite, change in bowel movements. Patient denies rashes, musculoskeletal complaints. No other specific complaints in a complete  review of systems.   BP 134/82  Pulse 72  Temp(Src) 97.6 F (36.4 C) (Oral)  Ht 5\' 11"  (1.803 m)  Wt 165 lb (74.844 kg)  BMI 23.01 kg/m2  well-developed well-nourished male in no acute distress. HEENT exam atraumatic, normocephalic, neck supple without jugular venous distention. Chest clear to auscultation cardiac exam S1-S2 are regular. Abdominal exam overweight with bowel sounds, soft and nontender. Extremities no edema. Neurologic exam is alert with a normal gait.

## 2011-02-03 DIAGNOSIS — H3530 Unspecified macular degeneration: Secondary | ICD-10-CM | POA: Diagnosis not present

## 2011-02-03 DIAGNOSIS — H35719 Central serous chorioretinopathy, unspecified eye: Secondary | ICD-10-CM | POA: Insufficient documentation

## 2011-02-03 DIAGNOSIS — H269 Unspecified cataract: Secondary | ICD-10-CM | POA: Diagnosis not present

## 2011-03-17 DIAGNOSIS — M2550 Pain in unspecified joint: Secondary | ICD-10-CM | POA: Diagnosis not present

## 2011-03-17 DIAGNOSIS — M25569 Pain in unspecified knee: Secondary | ICD-10-CM | POA: Diagnosis not present

## 2011-04-29 ENCOUNTER — Other Ambulatory Visit: Payer: Self-pay | Admitting: Internal Medicine

## 2011-05-03 ENCOUNTER — Ambulatory Visit (INDEPENDENT_AMBULATORY_CARE_PROVIDER_SITE_OTHER): Payer: Medicare Other | Admitting: Family

## 2011-05-03 ENCOUNTER — Encounter: Payer: Self-pay | Admitting: Family

## 2011-05-03 VITALS — BP 140/80 | Wt 168.0 lb

## 2011-05-03 DIAGNOSIS — M2550 Pain in unspecified joint: Secondary | ICD-10-CM

## 2011-05-03 DIAGNOSIS — M109 Gout, unspecified: Secondary | ICD-10-CM | POA: Diagnosis not present

## 2011-05-03 DIAGNOSIS — M79609 Pain in unspecified limb: Secondary | ICD-10-CM | POA: Diagnosis not present

## 2011-05-03 DIAGNOSIS — M79672 Pain in left foot: Secondary | ICD-10-CM

## 2011-05-03 LAB — BASIC METABOLIC PANEL
Calcium: 9 mg/dL (ref 8.4–10.5)
GFR: 83.31 mL/min (ref >60.00)
Glucose, Bld: 90 mg/dL (ref 70–99)
Sodium: 140 mEq/L (ref 135–145)

## 2011-05-03 LAB — CBC WITH DIFFERENTIAL/PLATELET
Basophils Absolute: 0 10*3/uL (ref 0.0–0.1)
Basophils Relative: 0.4 % (ref 0.0–3.0)
Eosinophils Absolute: 0.2 10*3/uL (ref 0.0–0.7)
Lymphocytes Relative: 19.1 % (ref 12.0–46.0)
MCHC: 33.3 g/dL (ref 30.0–36.0)
Neutrophils Relative %: 68.8 % (ref 43.0–77.0)
RBC: 4.66 Mil/uL (ref 4.22–5.81)

## 2011-05-03 MED ORDER — INDOMETHACIN 25 MG PO CAPS: 25.0000 mg | ORAL_CAPSULE | Freq: Two times a day (BID) | ORAL | Status: DC
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
## 2011-05-05 ENCOUNTER — Telehealth: Payer: Self-pay | Admitting: Internal Medicine

## 2011-05-05 DIAGNOSIS — K429 Umbilical hernia without obstruction or gangrene: Secondary | ICD-10-CM

## 2011-05-05 NOTE — Telephone Encounter (Signed)
Refer to Gen. surgery

## 2011-05-05 NOTE — Telephone Encounter (Signed)
Requesting lab results

## 2011-05-05 NOTE — Telephone Encounter (Signed)
Pt called and has a ? Hernia in abd around navel. Req to be referred to specialist. Also pt had labs drawn on Monday 05/03/11 and is req to get lab results.

## 2011-05-06 NOTE — Telephone Encounter (Signed)
Pt caller about both lab results and referral. Pt informed that [facility] will contact him directly about his appt. Pt also aware nurse will call him with lab work as soon as she has them available to give him.

## 2011-05-11 ENCOUNTER — Telehealth: Payer: Self-pay | Admitting: Internal Medicine

## 2011-05-11 NOTE — Telephone Encounter (Signed)
Pt called and is still waiting to hear back about lab results. Pls call asap today.

## 2011-05-11 NOTE — Telephone Encounter (Signed)
Pt aware of normal lab results.  

## 2011-05-31 ENCOUNTER — Encounter (INDEPENDENT_AMBULATORY_CARE_PROVIDER_SITE_OTHER): Payer: Self-pay | Admitting: Surgery

## 2011-05-31 ENCOUNTER — Ambulatory Visit (INDEPENDENT_AMBULATORY_CARE_PROVIDER_SITE_OTHER): Payer: Medicare Other | Admitting: Surgery

## 2011-05-31 VITALS — BP 155/71 | HR 59 | Temp 96.9°F | Ht 71.0 in | Wt 167.6 lb

## 2011-05-31 DIAGNOSIS — K420 Umbilical hernia with obstruction, without gangrene: Secondary | ICD-10-CM

## 2011-05-31 NOTE — Progress Notes (Signed)
Subjective:     Patient ID: Kevin Perez, male   DOB: 05-12-1945, 66 y.o.   MRN: [id number]  HPI  Abdallah [facility]  [birth year] [id number]  Patient Care Team: Lindley Magnus, MD as PCP - General Baker Pierini, FNP as Nurse Practitioner (Family Medicine)  This patient is a 66 y.o.male who presents today for surgical evaluation at the request of Dr. Milinda Pointer.   Reason for visit: Periumbilical swelling. Probable hernia.  The patient is a pleasant active retired male. He wants ago he noticed some intermittent swelling around his bellybutton. It was reducible. It is out all the time now. It is sensitive to touch. He is quite active in the yard and noticed after doing a lot of yardwork the springtime. He does have a history of inguinal hernias that were repaired simultaneously in an open fashion 25 years ago. Had some soreness and took time to recover from that but ultimately he did.  Can walk 4 miles easily without problems. No history of skin infections. No constipation. No prostate problems. Because of concerns, he was sent to [place] for surgical evaluation to see if this was hernia and would need repairing  Patient Active Problem List  Diagnoses  . HYPERLIPIDEMIA  . HYPERTENSION  . RAYNAUD'S SYNDROME  . VASCULITIS  . GERD    Past Medical History  Diagnosis Date  . HYPERLIPIDEMIA 06/17/2009  . HYPERTENSION 07/21/2006  . Raynaud's syndrome 07/21/2006  . VASCULITIS 05/23/2007  . GERD 07/21/2006  . Suicide attempt     No past surgical history on file.  History   Social History  . Marital Status: Married    Spouse Name: N/A    Number of Children: N/A  . Years of Education: N/A   Occupational History  . Not on file.   Social History Main Topics  . Smoking status: Former Games developer  . Smokeless tobacco: Former Neurosurgeon  . Alcohol Use: No  . Drug Use: No  . Sexually Active: Not on file   Other Topics Concern  . Not on file   Social History Narrative  . No narrative on  file    Family History  Problem Relation Age of Onset  . Hypertension Father   . Prostate cancer Father 84  . Stroke Father     Current Outpatient Prescriptions  Medication Sig Dispense Refill  . aspirin 81 MG tablet Take 81 mg by mouth daily.        . Calcium Carbonate-Vitamin D (CALTRATE 600+D) 600-400 MG-UNIT per tablet Take 1 tablet by mouth daily.        . clonazePAM (KLONOPIN) 1 MG tablet Take 1 tablet (1 mg total) by mouth daily as needed.  20 tablet  2  . indomethacin (INDOCIN) 25 MG capsule Take 1 capsule (25 mg total) by mouth 2 (two) times daily with a meal. 2 caps po TID x 48 hours, then 1 cap po TID until pain resolves.  60 capsule  1  . losartan (COZAAR) 50 MG tablet TAKE 1 TABLET EVERY DAY  30 tablet  5     Allergies  Allergen Reactions  . Amoxicillin-Pot Clavulanate Nausea And Vomiting  . Ace Inhibitors     REACTION: cough  . Codeine Phosphate     REACTION: vomiting    There were no vitals taken for this visit.  No results found.   Review of Systems  Constitutional: Negative for fever, chills and diaphoresis.  HENT: Negative for nosebleeds, sore throat, facial swelling,  mouth sores, trouble swallowing and ear discharge.   Eyes: Negative for photophobia, discharge and visual disturbance.  Respiratory: Negative for choking, chest tightness, shortness of breath and stridor.   Cardiovascular: Negative for chest pain and palpitations.  Gastrointestinal: Negative for nausea, vomiting, abdominal pain, diarrhea, constipation, blood in stool, abdominal distention, anal bleeding and rectal pain.  Genitourinary: Negative for dysuria, urgency, difficulty urinating and testicular pain.  Musculoskeletal: Negative for myalgias, back pain, arthralgias and gait problem.  Skin: Negative for color change, pallor, rash and wound.  Neurological: Negative for dizziness, speech difficulty, weakness, numbness and headaches.  Hematological: Negative for adenopathy. Does not  bruise/bleed easily.  Psychiatric/Behavioral: Negative for hallucinations, confusion and agitation.       Objective:   Physical Exam  Constitutional: He is oriented to person, place, and time. He appears well-developed and well-nourished. No distress.  HENT:  Head: Normocephalic.  Mouth/Throat: Oropharynx is clear and moist. No oropharyngeal exudate.  Eyes: Conjunctivae and EOM are normal. Pupils are equal, round, and reactive to light. No scleral icterus.  Neck: Normal range of motion. Neck supple. No tracheal deviation present.  Cardiovascular: Normal rate, regular rhythm and intact distal pulses.   Pulmonary/Chest: Effort normal and breath sounds normal. No respiratory distress.  Abdominal: Soft. He exhibits no distension. There is no tenderness. Hernia confirmed negative in the right inguinal area and confirmed negative in the left inguinal area.    Musculoskeletal: Normal range of motion. He exhibits no tenderness.  Lymphadenopathy:    He has no cervical adenopathy.       Right: No inguinal adenopathy present.       Left: No inguinal adenopathy present.  Neurological: He is alert and oriented to person, place, and time. No cranial nerve deficit. He exhibits normal muscle tone. Coordination normal.  Skin: Skin is warm and dry. No rash noted. He is not diaphoretic. No erythema. No pallor.  Psychiatric: He has a normal mood and affect. His behavior is normal. Judgment and thought content normal.       Assessment:     Incarcerated umb hernia    Plan:     This is a small amount probably he just requires no for repair. Would consider onlay mesh given his heavy physical activity. He his wife are interested in pursuing this. I discussed with them:  The anatomy & physiology of the abdominal wall was discussed.  The pathophysiology of hernias was discussed.  Natural history risks without surgery including progeressive enlargement, pain, incarceration & strangulation was discussed.    Contributors to complications such as smoking, obesity, diabetes, prior surgery, etc were discussed.   I feel the risks of no intervention will lead to serious problems that outweigh the operative risks; therefore, I recommended surgery to reduce and repair the hernia.  I explained laparoscopic techniques with possible need for an open approach.  I noted the probable use of mesh to patch and/or buttress the hernia repair  Risks such as bleeding, infection, abscess, need for further treatment, heart attack, death, and other risks were discussed.  I noted a good likelihood this will help address the problem.   Goals of post-operative recovery were discussed as well.  Possibility that this will not correct all symptoms was explained.  I stressed the importance of low-impact activity, aggressive pain control, avoiding constipation, & not pushing through pain to minimize risk of post-operative chronic pain or injury. Possibility of reherniation especially with smoking, obesity, diabetes, immunosuppression, and other health conditions was discussed.  We  will work to minimize complications.     An educational handout further explaining the pathology & treatment options was given as well.  Questions were answered.  The patient expresses understanding & wishes to proceed with surgery.

## 2011-05-31 NOTE — Patient Instructions (Signed)
Hernia, Surgical Repair A hernia occurs when an internal organ pushes out through a weak spot in the belly (abdominal) wall muscles. Hernias commonly occur in the groin and around the navel. Hernias often can be pushed back into place (reduced). Most hernias tend to get worse over time. Problems occur when abdominal contents get stuck in the opening (incarcerated hernia). The blood supply gets cut off (strangulated hernia). This is an emergency and needs surgery. Otherwise, hernia repair can be an elective procedure. This means you can schedule this at your convenience when an emergency is not present. Because complications can occur, if you decide to repair the hernia, it is best to do it soon. When it becomes an emergency procedure, there is increased risk of complications after surgery. CAUSES   Heavy lifting.   Obesity.   Prolonged coughing.   Straining to move your bowels.   Hernias can also occur through a cut (incision) by a surgeonafter an abdominal operation.  HOME CARE INSTRUCTIONS Before the repair:  Bed rest is not required. You may continue your normal activities, but avoid heavy lifting (more than 10 pounds) or straining. Cough gently. If you are a smoker, it is best to stop. Even the best hernia repair can break down with the continual strain of coughing.   Do not wear anything tight over your hernia. Do not try to keep it in with an outside bandage or truss. These can damage abdominal contents if they are trapped in the hernia sac.   Eat a normal diet. Avoid constipation. Straining over long periods of time to have a bowel movement will increase hernia size. It also can breakdown repairs. If you cannot do this with diet alone, laxatives or stool softeners may be used.  PRIOR TO SURGERY, SEEK IMMEDIATE MEDICAL CARE IF: You have problems (symptoms) of a trapped (incarcerated) hernia. Symptoms include:  An oral temperature above 102 F (38.9 C) develops, or as your caregiver  suggests.   Increasing abdominal pain.   Feeling sick to your stomach(nausea) and vomiting.   You stop passing gas or stool.   The hernia is stuck outside the abdomen, looks discolored, feels hard, or is tender.   You have any changes in your bowel habits or in the hernia that is unusual for you.  LET YOUR CAREGIVERS KNOW ABOUT THE FOLLOWING:  Allergies.   Medications taken including herbs, eye drops, over the counter medications, and creams.   Use of steroids (by mouth or creams).   Family or personal history of problems with anesthetics or Novocaine.   Possibility of pregnancy, if this applies.   Personal history of blood clots (thrombophlebitis).   Family or personal history of bleeding or blood problems.   Previous surgery.   Other health problems.  BEFORE THE PROCEDURE You should be present 1 hour prior to your procedure, or as directed by your caregiver.  AFTER THE PROCEDURE After surgery, you will be taken to the recovery area. A nurse will watch and check your progress there. Once you are awake, stable, and taking fluids well, you will be allowed to go home as long as there are no problems. Once home, an ice pack (wrapped in a light towel) applied to your operative site may help with discomfort. It may also keep the swelling down. Do not lift anything heavier than 10 pounds (4.55 kilograms). Take showers not baths. Do not drive while taking narcotics. Follow instructions as suggested by your caregiver.  SEEK IMMEDIATE MEDICAL CARE IF: After   surgery:  There is redness, swelling, or increasing pain in the wound.   There is pus coming from the wound.   There is drainage from a wound lasting longer than 1 day.   An unexplained oral temperature above 102 F (38.9 C) develops.   You notice a foul smell coming from the wound or dressing.   There is a breaking open of a wound (edged not staying together) after the sutures have been removed.   You notice increasing  pain in the shoulders (shoulder strap areas).   You develop dizzy episodes or fainting while standing.   You develop persistent nausea or vomiting.   You develop a rash.   You have difficulty breathing.   You develop any reaction or side effects to medications given.  MAKE SURE YOU:   Understand these instructions.   Will watch your condition.   Will get help right away if you are not doing well or get worse.  Document Released: 06/30/2000 Document Revised: 12/24/2010 Document Reviewed: 05/23/2007 Exit[employer] Patient Information 2012 [employer]. 

## 2011-06-09 ENCOUNTER — Encounter (INDEPENDENT_AMBULATORY_CARE_PROVIDER_SITE_OTHER): Payer: Self-pay

## 2011-06-25 DIAGNOSIS — K429 Umbilical hernia without obstruction or gangrene: Secondary | ICD-10-CM

## 2011-06-25 DIAGNOSIS — K449 Diaphragmatic hernia without obstruction or gangrene: Secondary | ICD-10-CM | POA: Diagnosis not present

## 2011-06-25 HISTORY — PX: HERNIA REPAIR: SHX51

## 2011-07-05 ENCOUNTER — Telehealth: Payer: Self-pay | Admitting: Internal Medicine

## 2011-07-05 NOTE — Telephone Encounter (Signed)
Caller: Kevin Perez/Patient; PCP: Birdie Sons; CB#: [phone]; Call regarding Spotted Tick On Groin Area This Morning.; relates broken skin where the tick was removed.  Last Tetanus 06/17/2009.  Emergent sx ruled out. Home care and parameters for callback given per Bites and Stings- Insects or Spiders protocol.

## 2011-07-12 ENCOUNTER — Ambulatory Visit (INDEPENDENT_AMBULATORY_CARE_PROVIDER_SITE_OTHER): Payer: Medicare Other | Admitting: Surgery

## 2011-07-12 ENCOUNTER — Encounter (INDEPENDENT_AMBULATORY_CARE_PROVIDER_SITE_OTHER): Payer: Self-pay | Admitting: Surgery

## 2011-07-12 VITALS — BP 160/70 | HR 61 | Temp 96.9°F | Ht 71.0 in | Wt 164.6 lb

## 2011-07-12 DIAGNOSIS — K420 Umbilical hernia with obstruction, without gangrene: Secondary | ICD-10-CM

## 2011-07-12 NOTE — Progress Notes (Signed)
Subjective:     Patient ID: Kevin Perez, male   DOB: 13-Sep-1945, 66 y.o.   MRN: [id number]  HPI   Olivier Frayre [facility]  08-Sep-1945 [id number]  Patient Care Team: Lindley Magnus, MD as PCP - General Baker Pierini, FNP as Nurse Practitioner (Family Medicine)  This patient is a 66 y.o.male who presents today for surgical evaluation.   Procedure: Primary repair of incarcerated umbilical hernia 06/25/2011  The patient comes in today feeling well. Minimal soreness. Off all pain meds on postop day 1. Walking well. Wants to get back to using his chainsaw and riding lawnmower to get some yard work done. Promises to avoid the super heavy stuff. Urinating fine. Energy level good.   Patient Active Problem List  Diagnosis  . HYPERLIPIDEMIA  . HYPERTENSION  . RAYNAUD'S SYNDROME  . VASCULITIS  . GERD  . Incarcerated umbilical hernia    Past Medical History  Diagnosis Date  . HYPERLIPIDEMIA 06/17/2009  . HYPERTENSION 07/21/2006  . Raynaud's syndrome 07/21/2006  . VASCULITIS 05/23/2007  . GERD 07/21/2006  . Suicide attempt     Past Surgical History  Procedure Date  . Hernia repair 06/25/11    umbilical hernia    History   Social History  . Marital Status: Married    Spouse Name: N/A    Number of Children: N/A  . Years of Education: N/A   Occupational History  . Not on file.   Social History Main Topics  . Smoking status: Former Games developer  . Smokeless tobacco: Former Neurosurgeon  . Alcohol Use: No  . Drug Use: No  . Sexually Active: Not on file   Other Topics Concern  . Not on file   Social History Narrative  . No narrative on file    Family History  Problem Relation Age of Onset  . Hypertension Father   . Prostate cancer Father 45  . Stroke Father     Current Outpatient Prescriptions  Medication Sig Dispense Refill  . aspirin 81 MG tablet Take 81 mg by mouth daily.        Marland Kitchen losartan (COZAAR) 50 MG tablet TAKE 1 TABLET EVERY DAY  30 tablet  5  . Calcium Carbonate-Vitamin D  (CALTRATE 600+D) 600-400 MG-UNIT per tablet Take 1 tablet by mouth daily.        . clonazePAM (KLONOPIN) 1 MG tablet Take 1 tablet (1 mg total) by mouth daily as needed.  20 tablet  2  . indomethacin (INDOCIN) 25 MG capsule Take 1 capsule (25 mg total) by mouth 2 (two) times daily with a meal. 2 caps po TID x 48 hours, then 1 cap po TID until pain resolves.  60 capsule  1     Allergies  Allergen Reactions  . Amoxicillin-Pot Clavulanate Nausea And Vomiting  . Ace Inhibitors     REACTION: cough  . Codeine Phosphate     REACTION: vomiting    BP 160/70  Pulse 61  Temp 96.9 F (36.1 C) (Temporal)  Ht 5\' 11"  (1.803 m)  Wt 164 lb 9.6 oz (74.662 kg)  BMI 22.96 kg/m2  SpO2 96%  No results found.   Review of Systems  Constitutional: Negative for fever, chills and diaphoresis.  HENT: Negative for nosebleeds, sore throat, facial swelling, mouth sores, trouble swallowing and ear discharge.   Eyes: Negative for photophobia, discharge and visual disturbance.  Respiratory: Negative for choking, chest tightness, shortness of breath and stridor.   Cardiovascular: Negative for chest pain and  palpitations.  Gastrointestinal: Negative for nausea, vomiting, abdominal pain, diarrhea, constipation, blood in stool, abdominal distention, anal bleeding and rectal pain.  Genitourinary: Negative for dysuria, urgency, difficulty urinating and testicular pain.  Musculoskeletal: Negative for myalgias, back pain, arthralgias and gait problem.  Skin: Negative for color change, pallor, rash and wound.  Neurological: Negative for dizziness, speech difficulty, weakness, numbness and headaches.  Hematological: Negative for adenopathy. Does not bruise/bleed easily.  Psychiatric/Behavioral: Negative for hallucinations, confusion and agitation.         Objective:   Physical Exam  Constitutional: He is oriented to person, place, and time. He appears well-developed and well-nourished. No distress.  HENT:  Head:  Normocephalic.  Mouth/Throat: Oropharynx is clear and moist. No oropharyngeal exudate.  Eyes: Conjunctivae and EOM are normal. Pupils are equal, round, and reactive to light. No scleral icterus.  Neck: Normal range of motion. Neck supple. No tracheal deviation present.  Cardiovascular: Normal rate, regular rhythm, normal heart sounds and intact distal pulses.   Pulmonary/Chest: Effort normal and breath sounds normal. No respiratory distress.  Abdominal: Soft. He exhibits no distension. There is no tenderness. Hernia confirmed negative in the right inguinal area and confirmed negative in the left inguinal area.       Incisions clean with normal healing ridges.  No hernias  Musculoskeletal: Normal range of motion. He exhibits no tenderness.  Lymphadenopathy:    He has no cervical adenopathy.       Right: No inguinal adenopathy present.       Left: No inguinal adenopathy present.  Neurological: He is alert and oriented to person, place, and time. No cranial nerve deficit. He exhibits normal muscle tone. Coordination normal.  Skin: Skin is warm and dry. No rash noted. He is not diaphoretic. No erythema. No pallor.  Psychiatric: He has a normal mood and affect. His behavior is normal. Judgment and thought content normal.       Assessment:     Incarcerated umb hernia s/p primary repair, recovering well    Plan:     Increase activity as tolerated.  Do not push through pain.  Start with moderate activity first. Proceed to unrestricted next month  Advanced on diet as tolerated. Bowel regimen to avoid problems.  Return to clinic p.r.n. The patient expressed understanding and appreciation

## 2011-07-27 ENCOUNTER — Encounter: Payer: Self-pay | Admitting: Internal Medicine

## 2011-07-27 ENCOUNTER — Ambulatory Visit (INDEPENDENT_AMBULATORY_CARE_PROVIDER_SITE_OTHER): Payer: Medicare Other | Admitting: Internal Medicine

## 2011-07-27 VITALS — BP 156/78 | HR 64 | Temp 98.3°F | Wt 164.0 lb

## 2011-07-27 DIAGNOSIS — I10 Essential (primary) hypertension: Secondary | ICD-10-CM

## 2011-07-27 MED ORDER — LOSARTAN POTASSIUM 100 MG PO TABS: 100.0000 mg | ORAL_TABLET | Freq: Every day | ORAL | Status: DC

## 2011-07-27 NOTE — Progress Notes (Signed)
Patient ID: Kevin Perez, male   DOB: [birth year], 66 y.o.   MRN: [id number]  Concerned with BP-- home bp and other physician office BPs: 124-160/68-82  Neck strain after painting sealing-  Past Medical History  Diagnosis Date  . HYPERLIPIDEMIA 06/17/2009  . HYPERTENSION 07/21/2006  . Raynaud's syndrome 07/21/2006  . VASCULITIS 05/23/2007  . GERD 07/21/2006  . Suicide attempt     History   Social History  . Marital Status: Married    Spouse Name: N/A    Number of Children: N/A  . Years of Education: N/A   Occupational History  . Not on file.   Social History Main Topics  . Smoking status: Former Games developer  . Smokeless tobacco: Former Neurosurgeon  . Alcohol Use: No  . Drug Use: No  . Sexually Active: Not on file   Other Topics Concern  . Not on file   Social History Narrative  . No narrative on file    Past Surgical History  Procedure Date  . Hernia repair 06/25/11    umbilical hernia    Family History  Problem Relation Age of Onset  . Hypertension Father   . Prostate cancer Father 77  . Stroke Father     Allergies  Allergen Reactions  . Amoxicillin-Pot Clavulanate Nausea And Vomiting  . Ace Inhibitors     REACTION: cough  . Codeine Phosphate     REACTION: vomiting    Current Outpatient Prescriptions on File Prior to Visit  Medication Sig Dispense Refill  . aspirin 81 MG tablet Take 81 mg by mouth daily.        . clonazePAM (KLONOPIN) 1 MG tablet Take 1 tablet (1 mg total) by mouth daily as needed.  20 tablet  2  . losartan (COZAAR) 50 MG tablet TAKE 1 TABLET EVERY DAY  30 tablet  5     patient denies chest pain, shortness of breath, orthopnea. Denies lower extremity edema, abdominal pain, change in appetite, change in bowel movements. Patient denies rashes, musculoskeletal complaints. No other specific complaints in a complete review of systems.   BP 156/78  Pulse 64  Temp 98.3 F (36.8 C) (Oral)  Wt 164 lb (74.39 kg)  well-developed well-nourished male in no  acute distress. HEENT exam atraumatic, normocephalic, neck supple without jugular venous distention. Chest clear to auscultation cardiac exam S1-S2 are regular. Abdominal exam overweight with bowel sounds, soft and nontender. Extremities no edema. Neurologic exam is alert with a normal gait.

## 2011-07-27 NOTE — Assessment & Plan Note (Signed)
Not controlled Increase losartan Repeat BP in one month

## 2011-07-28 ENCOUNTER — Ambulatory Visit: Payer: Medicare Other | Admitting: Internal Medicine

## 2011-08-27 ENCOUNTER — Ambulatory Visit (INDEPENDENT_AMBULATORY_CARE_PROVIDER_SITE_OTHER): Payer: Medicare Other | Admitting: Internal Medicine

## 2011-08-27 ENCOUNTER — Encounter: Payer: Self-pay | Admitting: Internal Medicine

## 2011-08-27 VITALS — BP 142/76

## 2011-08-27 DIAGNOSIS — I10 Essential (primary) hypertension: Secondary | ICD-10-CM

## 2011-09-08 ENCOUNTER — Other Ambulatory Visit: Payer: Self-pay | Admitting: *Deleted

## 2011-09-08 MED ORDER — LOSARTAN POTASSIUM-HCTZ 100-25 MG PO TABS: 1.0000 | ORAL_TABLET | Freq: Every day | ORAL | Status: DC

## 2011-09-08 NOTE — Telephone Encounter (Signed)
Pt brought BP readings in  7/17  146/82 7/22 156/78 7/24 146/68 7/27 158/70 7/28 132/78 8/6 160/76  Per Dr Cato Mulligan change med from losartan 100 mg to losartan/hctz 100/25 once daily.  Rx sent in electronically, pt aware

## 2011-12-04 DIAGNOSIS — Z23 Encounter for immunization: Secondary | ICD-10-CM | POA: Diagnosis not present

## 2012-01-03 DIAGNOSIS — H5200 Hypermetropia, unspecified eye: Secondary | ICD-10-CM | POA: Diagnosis not present

## 2012-01-03 DIAGNOSIS — H40019 Open angle with borderline findings, low risk, unspecified eye: Secondary | ICD-10-CM | POA: Diagnosis not present

## 2012-01-03 DIAGNOSIS — H259 Unspecified age-related cataract: Secondary | ICD-10-CM | POA: Diagnosis not present

## 2012-01-03 DIAGNOSIS — H52209 Unspecified astigmatism, unspecified eye: Secondary | ICD-10-CM | POA: Diagnosis not present

## 2012-01-03 DIAGNOSIS — H3530 Unspecified macular degeneration: Secondary | ICD-10-CM | POA: Diagnosis not present

## 2012-01-03 DIAGNOSIS — H35719 Central serous chorioretinopathy, unspecified eye: Secondary | ICD-10-CM | POA: Diagnosis not present

## 2012-01-03 DIAGNOSIS — H524 Presbyopia: Secondary | ICD-10-CM | POA: Diagnosis not present

## 2012-01-27 ENCOUNTER — Ambulatory Visit: Payer: Medicare Other | Admitting: Internal Medicine

## 2012-02-07 ENCOUNTER — Ambulatory Visit (INDEPENDENT_AMBULATORY_CARE_PROVIDER_SITE_OTHER): Payer: Medicare Other | Admitting: Internal Medicine

## 2012-02-07 ENCOUNTER — Encounter: Payer: Self-pay | Admitting: Internal Medicine

## 2012-02-07 VITALS — BP 122/72 | HR 72 | Temp 97.6°F | Wt 164.0 lb

## 2012-02-07 DIAGNOSIS — I10 Essential (primary) hypertension: Secondary | ICD-10-CM

## 2012-02-07 DIAGNOSIS — K219 Gastro-esophageal reflux disease without esophagitis: Secondary | ICD-10-CM

## 2012-02-07 DIAGNOSIS — E785 Hyperlipidemia, unspecified: Secondary | ICD-10-CM | POA: Diagnosis not present

## 2012-02-07 DIAGNOSIS — I776 Arteritis, unspecified: Secondary | ICD-10-CM

## 2012-02-07 LAB — CBC WITH DIFFERENTIAL/PLATELET
Basophils Absolute: 0 10*3/uL (ref 0.0–0.1)
Basophils Relative: 0.3 % (ref 0.0–3.0)
Eosinophils Relative: 3 % (ref 0.0–5.0)
HCT: 47.1 % (ref 39.0–52.0)
Hemoglobin: 15.9 g/dL (ref 13.0–17.0)
Lymphs Abs: 1.4 10*3/uL (ref 0.7–4.0)
Monocytes Relative: 7.9 % (ref 3.0–12.0)
Neutro Abs: 8 10*3/uL — ABNORMAL HIGH (ref 1.4–7.7)
RBC: 5.4 Mil/uL (ref 4.22–5.81)
RDW: 13.5 % (ref 11.5–14.6)

## 2012-02-07 LAB — BASIC METABOLIC PANEL
GFR: 58.06 mL/min — ABNORMAL LOW (ref >60.00)
Glucose, Bld: 104 mg/dL — ABNORMAL HIGH (ref 70–99)
Potassium: 4.2 mEq/L (ref 3.5–5.1)
Sodium: 137 mEq/L (ref 135–145)

## 2012-02-07 LAB — LIPID PANEL
HDL: 25.3 mg/dL — ABNORMAL LOW (ref >39.00)
LDL Cholesterol: 124 mg/dL — ABNORMAL HIGH (ref 0–99)
Total CHOL/HDL Ratio: 7
VLDL: 32.4 mg/dL (ref 0.0–40.0)

## 2012-02-07 NOTE — Assessment & Plan Note (Signed)
No sxs and not on any meds Will check CBC

## 2012-02-07 NOTE — Assessment & Plan Note (Signed)
Labs done 9 months ago-- I'll repeat now

## 2012-02-07 NOTE — Progress Notes (Signed)
Patient ID: Kevin Perez, male   DOB: [birth year], 67 y.o.   MRN: [id number] htn- tolerating home bps BP Readings from Last 3 Encounters:  02/07/12 122/72  08/27/11 142/76  07/27/11 156/78   Lipids-  Lab Results  Component Value Date   CHOL 200 07/17/2010   HDL 33.50* 07/17/2010   LDLCALC 145* 07/17/2010   LDLDIRECT 227.0 06/10/2009   TRIG 110.0 07/17/2010   CHOLHDL 6 07/17/2010   Vasculitis- no complaints and currently on no meds  2 other compliants-- sinus drainage for one week. No fever or chills. Has taken mucinex with some success  Also has noted testicular mass for years-- he once evaluated  Reviewed pmh, psh, sochx, meds   patient denies chest pain, shortness of breath, orthopnea. Denies lower extremity edema, abdominal pain, change in appetite, change in bowel movements. Patient denies rashes, musculoskeletal complaints. No other specific complaints in a complete review of systems.    well-developed well-nourished male in no acute distress. HEENT exam atraumatic, normocephalic, neck supple without jugular venous distention. Chest clear to auscultation cardiac exam S1-S2 are regular. Abdominal exam overweight with bowel sounds, soft and nontender. Extremities no edema. Neurologic exam is alert with a normal gait. GU- uncircumcised- small cyst at epididymis   A/p- uri-- trial flonase NS  Testicular mass-- no need for further evaluation

## 2012-02-07 NOTE — Assessment & Plan Note (Addendum)
Calculated 10 year CAD risk is 23-38% depending on which bp is used Discussed need for treatments Pt understands

## 2012-02-07 NOTE — Assessment & Plan Note (Signed)
No sxs No need for further ebval at this time

## 2012-03-20 ENCOUNTER — Telehealth: Payer: Self-pay | Admitting: Internal Medicine

## 2012-03-20 NOTE — Telephone Encounter (Signed)
Ok per Dr Swords referral order placed 

## 2012-03-20 NOTE — Telephone Encounter (Signed)
Pt states he spoke w/ MD about a problem w/ his toes rubbing together. Dr Cato Mulligan trimmed callus. It helped for a while, but now it bothers him everyday.  Pt would like referral to podiatrist, or should he come back in to follow up w/ MD? Pls advise.

## 2012-05-03 DIAGNOSIS — Q6689 Other  specified congenital deformities of feet: Secondary | ICD-10-CM | POA: Diagnosis not present

## 2012-05-03 DIAGNOSIS — M2040 Other hammer toe(s) (acquired), unspecified foot: Secondary | ICD-10-CM | POA: Diagnosis not present

## 2012-07-03 DIAGNOSIS — H3530 Unspecified macular degeneration: Secondary | ICD-10-CM | POA: Diagnosis not present

## 2012-07-03 DIAGNOSIS — H35719 Central serous chorioretinopathy, unspecified eye: Secondary | ICD-10-CM | POA: Diagnosis not present

## 2012-07-03 DIAGNOSIS — H40003 Preglaucoma, unspecified, bilateral: Secondary | ICD-10-CM | POA: Insufficient documentation

## 2012-07-03 DIAGNOSIS — H40009 Preglaucoma, unspecified, unspecified eye: Secondary | ICD-10-CM | POA: Diagnosis not present

## 2012-08-07 ENCOUNTER — Ambulatory Visit: Payer: Medicare Other | Admitting: Internal Medicine

## 2012-08-11 ENCOUNTER — Ambulatory Visit (INDEPENDENT_AMBULATORY_CARE_PROVIDER_SITE_OTHER): Payer: Medicare Other | Admitting: Internal Medicine

## 2012-08-11 ENCOUNTER — Encounter: Payer: Self-pay | Admitting: Internal Medicine

## 2012-08-11 VITALS — BP 112/62 | HR 72 | Temp 98.1°F | Wt 165.0 lb

## 2012-08-11 DIAGNOSIS — T1491XA Suicide attempt, initial encounter: Secondary | ICD-10-CM

## 2012-08-11 DIAGNOSIS — X838XXA Intentional self-harm by other specified means, initial encounter: Secondary | ICD-10-CM | POA: Diagnosis not present

## 2012-08-11 DIAGNOSIS — I10 Essential (primary) hypertension: Secondary | ICD-10-CM

## 2012-08-11 NOTE — Assessment & Plan Note (Signed)
Discussed at length on 08/11/2012 .  Pt requesting concealed weapon permit He has multiple guns currently Has had psych evaluation and attempt was 5+ years ago. I have no concerns regarding concealed weapon permit as compared to any other applicant

## 2012-08-11 NOTE — Assessment & Plan Note (Signed)
bp controlled  meds reviewed

## 2012-08-11 NOTE — Progress Notes (Signed)
Patient ID: Kevin Perez, male   DOB: 08/17/1945, 67 y.o.   MRN: [id number]  htn- home bps 120s/70s  He feels well.  Reviewed pmh, psh, sochx  Reviewed meds   well-developed well-nourished male in no acute distress. HEENT exam atraumatic, normocephalic, neck supple without jugular venous distention. Chest clear to auscultation cardiac exam S1-S2 are regular. Abdominal exam overweight with bowel sounds, soft and nontender. Extremities no edema. Neurologic exam is alert with a normal gait.

## 2012-08-27 ENCOUNTER — Other Ambulatory Visit: Payer: Self-pay | Admitting: Internal Medicine

## 2012-09-04 DIAGNOSIS — F0630 Mood disorder due to known physiological condition, unspecified: Secondary | ICD-10-CM | POA: Diagnosis not present

## 2012-09-04 DIAGNOSIS — Z0389 Encounter for observation for other suspected diseases and conditions ruled out: Secondary | ICD-10-CM | POA: Diagnosis not present

## 2013-02-05 ENCOUNTER — Ambulatory Visit (INDEPENDENT_AMBULATORY_CARE_PROVIDER_SITE_OTHER): Payer: Medicare Other | Admitting: Internal Medicine

## 2013-02-05 ENCOUNTER — Encounter: Payer: Self-pay | Admitting: Internal Medicine

## 2013-02-05 VITALS — BP 122/64 | HR 72 | Temp 97.6°F | Ht 71.0 in | Wt 168.0 lb

## 2013-02-05 DIAGNOSIS — K219 Gastro-esophageal reflux disease without esophagitis: Secondary | ICD-10-CM

## 2013-02-05 DIAGNOSIS — E785 Hyperlipidemia, unspecified: Secondary | ICD-10-CM | POA: Diagnosis not present

## 2013-02-05 DIAGNOSIS — I10 Essential (primary) hypertension: Secondary | ICD-10-CM

## 2013-02-05 LAB — CBC WITH DIFFERENTIAL/PLATELET
BASOS ABS: 0 10*3/uL (ref 0.0–0.1)
Basophils Relative: 0.5 % (ref 0.0–3.0)
EOS ABS: 0.2 10*3/uL (ref 0.0–0.7)
Eosinophils Relative: 2.8 % (ref 0.0–5.0)
HCT: 43.6 % (ref 39.0–52.0)
HEMOGLOBIN: 14.9 g/dL (ref 13.0–17.0)
LYMPHS ABS: 1.3 10*3/uL (ref 0.7–4.0)
LYMPHS PCT: 21.5 % (ref 12.0–46.0)
MCHC: 34.2 g/dL (ref 30.0–36.0)
MCV: 85.4 fl (ref 78.0–100.0)
MONOS PCT: 8 % (ref 3.0–12.0)
Monocytes Absolute: 0.5 10*3/uL (ref 0.1–1.0)
NEUTROS ABS: 4.1 10*3/uL (ref 1.4–7.7)
Neutrophils Relative %: 67.2 % (ref 43.0–77.0)
PLATELETS: 156 10*3/uL (ref 150.0–400.0)
RBC: 5.1 Mil/uL (ref 4.22–5.81)
RDW: 14.1 % (ref 11.5–14.6)
WBC: 6.1 10*3/uL (ref 4.5–10.5)

## 2013-02-05 LAB — LIPID PANEL
CHOL/HDL RATIO: 6
CHOLESTEROL: 184 mg/dL (ref 0–200)
HDL: 31.2 mg/dL — ABNORMAL LOW (ref >39.00)
TRIGLYCERIDES: 244 mg/dL — AB (ref 0.0–149.0)
VLDL: 48.8 mg/dL — AB (ref 0.0–40.0)

## 2013-02-05 LAB — LDL CHOLESTEROL, DIRECT: LDL DIRECT: 107 mg/dL

## 2013-02-05 LAB — HEPATIC FUNCTION PANEL
ALBUMIN: 4.2 g/dL (ref 3.5–5.2)
ALT: 16 U/L (ref 0–53)
AST: 19 U/L (ref 0–37)
Alkaline Phosphatase: 73 U/L (ref 39–117)
BILIRUBIN TOTAL: 0.9 mg/dL (ref 0.3–1.2)
Bilirubin, Direct: 0.1 mg/dL (ref 0.0–0.3)
Total Protein: 7.8 g/dL (ref 6.0–8.3)

## 2013-02-05 LAB — BASIC METABOLIC PANEL
BUN: 17 mg/dL (ref 6–23)
CALCIUM: 9.2 mg/dL (ref 8.4–10.5)
CO2: 31 mEq/L (ref 19–32)
CREATININE: 1.1 mg/dL (ref 0.4–1.5)
Chloride: 101 mEq/L (ref 96–112)
GFR: 69.36 mL/min (ref >60.00)
GLUCOSE: 98 mg/dL (ref 70–99)
Potassium: 3.7 mEq/L (ref 3.5–5.1)
Sodium: 138 mEq/L (ref 135–145)

## 2013-02-05 NOTE — Progress Notes (Signed)
htn- tolerating meds, no home bps  Vasculitis- no recurrence  Mood- takes klonopin prn  Reviewed pmh, meds, sochx  Ros- no CP, SOB, pND, orthopnea No other complaints  BP 122/64  Pulse 72  Temp(Src) 97.6 F (36.4 C) (Oral)  Ht 5\' 11"  (1.803 m)  Wt 168 lb (76.204 kg)  BMI 23.44 kg/m2  well-developed well-nourished male in no acute distress. HEENT exam atraumatic, normocephalic, neck supple without jugular venous distention. Chest clear to auscultation cardiac exam S1-S2 are regular. Abdominal exam overweight with bowel sounds, soft and nontender. Extremities no edema. Neurologic exam is alert with a normal gait.   Hyperlipidemia- check labs  Hyperglycemia- check labs today

## 2013-02-05 NOTE — Assessment & Plan Note (Signed)
Will check today

## 2013-02-05 NOTE — Assessment & Plan Note (Signed)
Adequate control Continue meds 

## 2013-02-05 NOTE — Assessment & Plan Note (Signed)
No sxs Will check cbc

## 2013-02-05 NOTE — Progress Notes (Signed)
Pre visit review using our clinic review tool, if applicable. No additional management support is needed unless otherwise documented below in the visit note. 

## 2013-02-21 ENCOUNTER — Telehealth: Payer: Self-pay | Admitting: Internal Medicine

## 2013-02-21 NOTE — Telephone Encounter (Signed)
Relevant patient education assigned to patient using Emmi. ° °

## 2013-03-08 DIAGNOSIS — F4321 Adjustment disorder with depressed mood: Secondary | ICD-10-CM | POA: Diagnosis not present

## 2013-03-12 ENCOUNTER — Encounter: Payer: Self-pay | Admitting: *Deleted

## 2013-03-21 ENCOUNTER — Encounter: Payer: Self-pay | Admitting: Internal Medicine

## 2013-03-27 ENCOUNTER — Encounter: Payer: Self-pay | Admitting: Internal Medicine

## 2013-04-30 ENCOUNTER — Encounter: Payer: Self-pay | Admitting: Internal Medicine

## 2013-05-25 ENCOUNTER — Encounter: Payer: Medicare Other | Admitting: Internal Medicine

## 2013-06-13 ENCOUNTER — Other Ambulatory Visit: Payer: Self-pay | Admitting: Family

## 2013-06-26 ENCOUNTER — Ambulatory Visit (AMBULATORY_SURGERY_CENTER): Payer: Self-pay

## 2013-06-26 VITALS — Ht 71.0 in | Wt 170.2 lb

## 2013-06-26 DIAGNOSIS — Z1211 Encounter for screening for malignant neoplasm of colon: Secondary | ICD-10-CM

## 2013-06-26 MED ORDER — NA SULFATE-K SULFATE-MG SULF 17.5-3.13-1.6 GM/177ML PO SOLN: ORAL | Status: DC

## 2013-06-26 NOTE — Progress Notes (Signed)
Per pt, no allergies to soy or egg products.Pt not taking any weight loss meds or using  O2 at home. 

## 2013-07-10 ENCOUNTER — Ambulatory Visit (AMBULATORY_SURGERY_CENTER): Payer: Medicare Other | Admitting: Internal Medicine

## 2013-07-10 ENCOUNTER — Encounter: Payer: Medicare Other | Admitting: Internal Medicine

## 2013-07-10 ENCOUNTER — Encounter: Payer: Self-pay | Admitting: Internal Medicine

## 2013-07-10 VITALS — BP 113/61 | HR 45 | Temp 96.7°F | Resp 19 | Ht 71.0 in | Wt 170.0 lb

## 2013-07-10 DIAGNOSIS — I10 Essential (primary) hypertension: Secondary | ICD-10-CM | POA: Diagnosis not present

## 2013-07-10 DIAGNOSIS — I776 Arteritis, unspecified: Secondary | ICD-10-CM | POA: Diagnosis not present

## 2013-07-10 DIAGNOSIS — K5730 Diverticulosis of large intestine without perforation or abscess without bleeding: Secondary | ICD-10-CM

## 2013-07-10 DIAGNOSIS — Z1211 Encounter for screening for malignant neoplasm of colon: Secondary | ICD-10-CM

## 2013-07-10 MED ORDER — SODIUM CHLORIDE 0.9 % IV SOLN: 500.0000 mL | INTRAVENOUS | Status: DC

## 2013-07-10 NOTE — Progress Notes (Signed)
A/ox3, pleased with MAC, report to RN 

## 2013-07-10 NOTE — Patient Instructions (Addendum)
No polyps or cancer seen at colonoscopy. You do have diverticulosis - thickened muscle rings and pouches in the colon wall. Please read the handout about this condition.  Next routine colonoscopy in 10 years - 2025  I appreciate the opportunity to care for you. Gatha Mayer, MD, FACG   YOU HAD AN ENDOSCOPIC PROCEDURE TODAY AT [facility]: Refer to the procedure report that was given to you for any specific questions about what was found during the examination.  If the procedure report does not answer your questions, please call your gastroenterologist to clarify.  If you requested that your care partner not be given the details of your procedure findings, then the procedure report has been included in a sealed envelope for you to review at your convenience later.  YOU SHOULD EXPECT: Some feelings of bloating in the abdomen. Passage of more gas than usual.  Walking can help get rid of the air that was put into your GI tract during the procedure and reduce the bloating. If you had a lower endoscopy (such as a colonoscopy or flexible sigmoidoscopy) you may notice spotting of blood in your stool or on the toilet paper. If you underwent a bowel prep for your procedure, then you may not have a normal bowel movement for a few days.  DIET: Your first meal following the procedure should be a light meal and then it is ok to progress to your normal diet.  A half-sandwich or bowl of soup is an example of a good first meal.  Heavy or fried foods are harder to digest and may make you feel nauseous or bloated.  Likewise meals heavy in dairy and vegetables can cause extra gas to form and this can also increase the bloating.  Drink plenty of fluids but you should avoid alcoholic beverages for 24 hours.  ACTIVITY: Your care partner should take you home directly after the procedure.  You should plan to take it easy, moving slowly for the rest of the day.  You can resume normal activity the day after  the procedure however you should NOT DRIVE or use heavy machinery for 24 hours (because of the sedation medicines used during the test).    SYMPTOMS TO REPORT IMMEDIATELY: A gastroenterologist can be reached at any hour.  During normal business hours, 8:30 AM to 5:00 PM Monday through Friday, call [phone].  After hours and on weekends, please call the GI answering service at [phone] who will take a message and have the physician on call contact you.   Following lower endoscopy (colonoscopy or flexible sigmoidoscopy):  Excessive amounts of blood in the stool  Significant tenderness or worsening of abdominal pains  Swelling of the abdomen that is new, acute  Fever of 100F or higher  FOLLOW UP: If any biopsies were taken you will be contacted by phone or by letter within the next 1-3 weeks.  Call your gastroenterologist if you have not heard about the biopsies in 3 weeks.  Our staff will call the home number listed on your records the next business day following your procedure to check on you and address any questions or concerns that you may have at that time regarding the information given to you following your procedure. This is a courtesy call and so if there is no answer at the home number and we have not heard from you through the emergency physician on call, we will assume that you have returned to your regular daily  activities without incident.  SIGNATURES/CONFIDENTIALITY: You and/or your care partner have signed paperwork which will be entered into your electronic medical record.  These signatures attest to the fact that that the information above on your After Visit Summary has been reviewed and is understood.  Full responsibility of the confidentiality of this discharge information lies with you and/or your care-partner.   Resume medications. Information given on diverticulosis with discharge instructions.

## 2013-07-10 NOTE — Op Note (Signed)
[place]  [address]. [place], [id number]   COLONOSCOPY PROCEDURE REPORT  PATIENT: Kevin Perez, Kevin Perez  MR#: [id number] BIRTHDATE: 08-18-1945 , 68  yrs. old GENDER: Male ENDOSCOPIST: Gatha Mayer, MD, [facility] PROCEDURE DATE:  07/10/2013 PROCEDURE:   Colonoscopy, screening First Screening Colonoscopy - Avg.  risk and is 50 yrs.  old or older - No.  Prior Negative Screening - Now for repeat screening. 10 or more years since last screening  History of Adenoma - Now for follow-up colonoscopy & has been > or = to 3 yrs.  N/A  Polyps Removed Today? No.  Recommend repeat exam, <10 yrs? No. ASA CLASS:   Class II INDICATIONS:average risk screening and Last colonoscopy performed 10 years ago. MEDICATIONS: Propofol (Diprivan) 180 mg IV, MAC sedation, administered by CRNA, and These medications were titrated to patient response per physician's verbal order  DESCRIPTION OF PROCEDURE:   After the risks benefits and alternatives of the procedure were thoroughly explained, informed consent was obtained.  A digital rectal exam revealed no abnormalities of the rectum.   The [id number]  endoscope was introduced through the anus and advanced to the cecum, which was identified by both the appendix and ileocecal valve. No adverse events experienced.   The quality of the prep was excellent using Suprep  The instrument was then slowly withdrawn as the colon was fully examined.      COLON FINDINGS: Severe diverticulosis was noted The finding was in the left colon.   The colon mucosa was otherwise normal. Retroflexed views revealed no abnormalities. The time to cecum=2 minutes 50 seconds.  Withdrawal time=7 minutes 42 seconds.  The scope was withdrawn and the procedure completed. COMPLICATIONS: There were no complications.  ENDOSCOPIC IMPRESSION: 1.   Severe diverticulosis was noted in the left colon 2.   The colon mucosa was otherwise  normal  RECOMMENDATIONS: Repeat colonoscopy 10 years - 2025   eSigned:  Gatha Mayer, MD, [facility] 07/10/2013 8:29 AM   cc: The Patient

## 2013-07-11 ENCOUNTER — Telehealth: Payer: Self-pay | Admitting: *Deleted

## 2013-07-11 NOTE — Telephone Encounter (Signed)
  Follow up Call-  Call back number 07/10/2013  Post procedure Call Back phone  # [phone]  Permission to leave phone message Yes     Patient questions:  Do you have a fever, pain , or abdominal swelling? no Pain Score  0 *  Have you tolerated food without any problems? yes  Have you been able to return to your normal activities? yes  Do you have any questions about your discharge instructions: Diet   no Medications  no Follow up visit  no  Do you have questions or concerns about your Care? no  Actions: * If pain score is 4 or above: No action needed, pain <4.

## 2013-08-10 ENCOUNTER — Other Ambulatory Visit: Payer: Self-pay | Admitting: Internal Medicine

## 2013-08-22 DIAGNOSIS — H35719 Central serous chorioretinopathy, unspecified eye: Secondary | ICD-10-CM | POA: Diagnosis not present

## 2013-08-22 DIAGNOSIS — H3530 Unspecified macular degeneration: Secondary | ICD-10-CM | POA: Diagnosis not present

## 2013-08-22 DIAGNOSIS — Z885 Allergy status to narcotic agent status: Secondary | ICD-10-CM | POA: Diagnosis not present

## 2013-08-22 DIAGNOSIS — H259 Unspecified age-related cataract: Secondary | ICD-10-CM | POA: Insufficient documentation

## 2013-08-22 DIAGNOSIS — H3500 Unspecified background retinopathy: Secondary | ICD-10-CM | POA: Diagnosis not present

## 2013-08-22 DIAGNOSIS — H40009 Preglaucoma, unspecified, unspecified eye: Secondary | ICD-10-CM | POA: Diagnosis not present

## 2013-08-22 DIAGNOSIS — H318 Other specified disorders of choroid: Secondary | ICD-10-CM | POA: Diagnosis not present

## 2013-09-11 ENCOUNTER — Telehealth: Payer: Self-pay | Admitting: Internal Medicine

## 2013-09-11 NOTE — Telephone Encounter (Signed)
lmovm to call back and est with [facility]

## 2013-09-14 NOTE — Telephone Encounter (Signed)
Pt scheduled  

## 2013-10-29 ENCOUNTER — Encounter: Payer: Self-pay | Admitting: Family Medicine

## 2013-10-29 ENCOUNTER — Ambulatory Visit (INDEPENDENT_AMBULATORY_CARE_PROVIDER_SITE_OTHER): Payer: Medicare Other | Admitting: Family Medicine

## 2013-10-29 VITALS — BP 136/78 | HR 60 | Temp 97.9°F | Wt 169.0 lb

## 2013-10-29 DIAGNOSIS — E785 Hyperlipidemia, unspecified: Secondary | ICD-10-CM | POA: Diagnosis not present

## 2013-10-29 DIAGNOSIS — G4700 Insomnia, unspecified: Secondary | ICD-10-CM

## 2013-10-29 DIAGNOSIS — I10 Essential (primary) hypertension: Secondary | ICD-10-CM

## 2013-10-29 MED ORDER — CLONAZEPAM 1 MG PO TABS: 0.5000 mg | ORAL_TABLET | Freq: Every day | ORAL | Status: DC | PRN

## 2013-10-29 NOTE — Assessment & Plan Note (Signed)
Last ldl 107 9 months ago. Discussed increasing exercise. Trouble with statin in past-bad cramps but never tried once weekly. Repeat levels in 6 months and calculate 10 year risk. Consider once weekly dosing if used.

## 2013-10-29 NOTE — Progress Notes (Signed)
Kevin Reddish, MD Phone: [phone]  Subjective:  Patient presents today to establish care with me as their new primary care provider. Patient was formerly a patient of Dr. Leanne Chang. Chief complaint-noted.   Hypertension-well controlled  BP Readings from Last 3 Encounters:  10/29/13 136/78  07/10/13 113/61  02/05/13 122/64   Home BP monitoring-yes, typically 120-130/80 Compliant with medications-yes without side effects ROS-Denies any CP, HA, SOB, blurry vision, LE edema  Hyperlipidemia-mild poor control LDL between 100-110 in January 2015 On statin: difficulty tolerating in past Regular exercise: yes, but could increase ROS- no chest pain or shortness of breath. No myalgias  Insomnia/anxiety-mild poor control. States uses klonopin sparingly. Last use s2012. Recently has had a harder time sleeping and requests refill ROS- No SI/HI. No depressive symptoms.   The following were reviewed and entered/updated in epic: Past Medical History  Diagnosis Date  . HYPERLIPIDEMIA 06/17/2009  . HYPERTENSION 07/21/2006  . Raynaud's syndrome 07/21/2006  . VASCULITIS 05/23/2007  . GERD 07/21/2006  . Suicide attempt     Due to Prednisone!  . Gout    Patient Active Problem List   Diagnosis Date Noted  . Insomnia 10/29/2013    Priority: Medium  . Hyperlipidemia 06/17/2009    Priority: Medium  . Arteritis 05/23/2007    Priority: Medium  . Suicide attempt 01/19/2007    Priority: Medium  . Essential hypertension 07/21/2006    Priority: Medium  . GERD 07/21/2006    Priority: Low   Past Surgical History  Procedure Laterality Date  . Hernia repair  6/0/73    umbilical hernia  . Inguinal hernia repair      Bil    Family History  Problem Relation Age of Onset  . Hypertension Father   . Prostate cancer Father 41    never operated, not cause of death  . Stroke Father     Medications- reviewed and updated Current Outpatient Prescriptions  Medication Sig Dispense Refill  . aspirin  81 MG tablet Take 81 mg by mouth daily.        . calcium carbonate (TUMS - DOSED IN MG ELEMENTAL CALCIUM) 500 MG chewable tablet Chew 1 tablet by mouth as needed for indigestion or heartburn.      . indomethacin (INDOCIN) 25 MG capsule TAKE 2 CAPSULES 3 TIMES A DAY FOR 48 HOURS THEN TAKE 1 CAPSULE 3 TIMES A DAY UNTIL PAIN IS RESOLVED  60 capsule  5  . losartan-hydrochlorothiazide (HYZAAR) 100-25 MG per tablet TAKE 1 TABLET BY MOUTH DAILY.  90 tablet  3  . clonazePAM (KLONOPIN) 1 MG tablet Take 0.5-1 tablets (0.5-1 mg total) by mouth daily as needed.  15 tablet  2   No current facility-administered medications for this visit.    Allergies-reviewed and updated Allergies  Allergen Reactions  . Amoxicillin-Pot Clavulanate Nausea And Vomiting  . Ace Inhibitors     REACTION: cough  . Codeine Phosphate     REACTION: vomiting    History   Social History  . Marital Status: Married    Spouse Name: N/A    Number of Children: N/A  . Years of Education: N/A   Social History Main Topics  . Smoking status: Former Smoker -- 0.05 packs/day for 20 years    Types: Cigarettes  . Smokeless tobacco: Former Systems developer    Types: [place] date: 06/26/1989     Comment: Smoked occasionally  . Alcohol Use: 1.0 oz/week    2 drink(s) per week  . Drug  Use: No  . Sexual Activity: None   Other Topics Concern  . None   Social History Narrative   Married 1967. 2 kids. 1 grandchild. 1 greatgrandchilden.       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 Oropharynx normal. Has several missing teeth and caps on front teeth.  CV: RRR no murmurs rubs or gallops Lungs: CTAB no crackles, wheeze, rhonchi Abdomen: soft/nontender/nondistended/normal bowel  sounds.  Ext: no edema Skin: warm, dry, no rash Neuro: grossly normal, moves all extremities, PERRLA  Assessment/Plan:  Hyperlipidemia Last ldl 107 9 months ago. Discussed increasing exercise. Trouble with statin in past-bad cramps but never tried once weekly. Repeat levels in 6 months and calculate 10 year risk. Consider once weekly dosing if used.   Essential hypertension Continue losartan-hctz as well controlled.   Insomnia Refilled klonopin today. Encouraged patient to use sparingly. Seems to be primarily insomnia but anxiety related as it calms his mind and allows him to rest.    Meds ordered this encounter  . clonazePAM (KLONOPIN) 1 MG tablet    Sig: Take 0.5-1 tablets (0.5-1 mg total) by mouth daily as needed.    Dispense:  15 tablet    Refill:  2  Shredded copy of klonopin accidentally printed as 2015 tablets  Flu shot planned at drug store.

## 2013-10-29 NOTE — Assessment & Plan Note (Signed)
Continue losartan-hctz as well controlled.

## 2013-10-29 NOTE — Assessment & Plan Note (Signed)
Refilled klonopin today. Encouraged patient to use sparingly. Seems to be primarily insomnia but anxiety related as it calms his mind and allows him to rest.

## 2013-10-29 NOTE — Patient Instructions (Signed)
Blood pressure looks great.   Recheck cholesterol next visit.   Insomnia/anxiety-refilled your klonopin. Use as sparingly as possible.

## 2013-11-02 ENCOUNTER — Other Ambulatory Visit: Payer: Self-pay

## 2014-01-08 ENCOUNTER — Ambulatory Visit (INDEPENDENT_AMBULATORY_CARE_PROVIDER_SITE_OTHER): Payer: Medicare Other | Admitting: Family Medicine

## 2014-01-08 ENCOUNTER — Encounter: Payer: Self-pay | Admitting: Family Medicine

## 2014-01-08 ENCOUNTER — Telehealth: Payer: Self-pay | Admitting: Family Medicine

## 2014-01-08 VITALS — BP 130/64 | HR 60 | Temp 97.6°F | Wt 168.0 lb

## 2014-01-08 DIAGNOSIS — M1A0790 Idiopathic chronic gout, unspecified ankle and foot, without tophus (tophi): Secondary | ICD-10-CM

## 2014-01-08 DIAGNOSIS — M109 Gout, unspecified: Secondary | ICD-10-CM | POA: Insufficient documentation

## 2014-01-08 DIAGNOSIS — M7041 Prepatellar bursitis, right knee: Secondary | ICD-10-CM | POA: Diagnosis not present

## 2014-01-08 NOTE — Telephone Encounter (Signed)
Pt needs OV Mrs. Kevin Perez

## 2014-01-08 NOTE — Telephone Encounter (Signed)
yes

## 2014-01-08 NOTE — Telephone Encounter (Signed)
Pt has a swollen knee, puffed out like a crown and red. Pt refused to see another provider. Asked to send message and see what you advise.  pls advise
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
## 2014-01-08 NOTE — Telephone Encounter (Signed)
Pt  Will need OV to be evaluated right?

## 2014-01-08 NOTE — Patient Instructions (Signed)
I would keep icing the knee at least twice a day.  Continue indomethacin 25mg  3x a day for the next week.  Swelling should resolve within 2-3 weeks. I suspect this is due to direct trauma and not due to gout but gout is possible.   As long as your symptoms continue to slowly improve, you can monitor at home.   If your symptoms worsen (redness, swelling, pain) then we will have you see Dr. Charlann Boxer of sports medicine. You can call next week and we will place a referral.

## 2014-01-09 NOTE — Telephone Encounter (Signed)
Pt has been scheduled.  °

## 2014-01-10 ENCOUNTER — Emergency Department (HOSPITAL_BASED_OUTPATIENT_CLINIC_OR_DEPARTMENT_OTHER): Payer: Medicare Other

## 2014-01-10 ENCOUNTER — Emergency Department (HOSPITAL_BASED_OUTPATIENT_CLINIC_OR_DEPARTMENT_OTHER)
Admission: EM | Admit: 2014-01-10 | Discharge: 2014-01-10 | Disposition: A | Discharge status: Home / Self Care | Payer: Medicare Other | Attending: Emergency Medicine | Admitting: Emergency Medicine

## 2014-01-10 ENCOUNTER — Encounter (HOSPITAL_BASED_OUTPATIENT_CLINIC_OR_DEPARTMENT_OTHER): Payer: Self-pay

## 2014-01-10 DIAGNOSIS — I10 Essential (primary) hypertension: Secondary | ICD-10-CM | POA: Insufficient documentation

## 2014-01-10 DIAGNOSIS — W2203XA Walked into furniture, initial encounter: Secondary | ICD-10-CM | POA: Diagnosis not present

## 2014-01-10 DIAGNOSIS — Z791 Long term (current) use of non-steroidal anti-inflammatories (NSAID): Secondary | ICD-10-CM | POA: Diagnosis not present

## 2014-01-10 DIAGNOSIS — Z7982 Long term (current) use of aspirin: Secondary | ICD-10-CM | POA: Insufficient documentation

## 2014-01-10 DIAGNOSIS — Y9389 Activity, other specified: Secondary | ICD-10-CM | POA: Diagnosis not present

## 2014-01-10 DIAGNOSIS — M7989 Other specified soft tissue disorders: Secondary | ICD-10-CM | POA: Diagnosis not present

## 2014-01-10 DIAGNOSIS — M109 Gout, unspecified: Secondary | ICD-10-CM | POA: Diagnosis not present

## 2014-01-10 DIAGNOSIS — M7041 Prepatellar bursitis, right knee: Secondary | ICD-10-CM | POA: Diagnosis not present

## 2014-01-10 DIAGNOSIS — R52 Pain, unspecified: Secondary | ICD-10-CM

## 2014-01-10 DIAGNOSIS — Y929 Unspecified place or not applicable: Secondary | ICD-10-CM | POA: Diagnosis not present

## 2014-01-10 DIAGNOSIS — Y998 Other external cause status: Secondary | ICD-10-CM | POA: Insufficient documentation

## 2014-01-10 DIAGNOSIS — Z8639 Personal history of other endocrine, nutritional and metabolic disease: Secondary | ICD-10-CM | POA: Diagnosis not present

## 2014-01-10 DIAGNOSIS — M25561 Pain in right knee: Secondary | ICD-10-CM | POA: Diagnosis not present

## 2014-01-10 DIAGNOSIS — Z880 Allergy status to penicillin: Secondary | ICD-10-CM | POA: Diagnosis not present

## 2014-01-10 DIAGNOSIS — Z8719 Personal history of other diseases of the digestive system: Secondary | ICD-10-CM | POA: Insufficient documentation

## 2014-01-10 DIAGNOSIS — S8001XA Contusion of right knee, initial encounter: Secondary | ICD-10-CM | POA: Insufficient documentation

## 2014-01-10 DIAGNOSIS — S8991XA Unspecified injury of right lower leg, initial encounter: Secondary | ICD-10-CM | POA: Diagnosis present

## 2014-01-10 DIAGNOSIS — Z87891 Personal history of nicotine dependence: Secondary | ICD-10-CM | POA: Diagnosis not present

## 2014-01-10 DIAGNOSIS — M7051 Other bursitis of knee, right knee: Secondary | ICD-10-CM

## 2014-01-10 MED ORDER — LIDOCAINE HCL 2 % IJ SOLN
INTRAMUSCULAR | Status: AC
  Filled 2014-01-10: qty 20

## 2014-01-10 MED ORDER — NAPROXEN 500 MG PO TABS: 500.0000 mg | ORAL_TABLET | Freq: Two times a day (BID) | ORAL | Status: DC
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
## 2014-01-10 NOTE — ED Notes (Signed)
Pt discharged to home with wife. NAD.  

## 2014-01-10 NOTE — ED Provider Notes (Addendum)
CSN: [id number]     Arrival date & time 01/10/14  4665 History   First MD Initiated Contact with Patient 01/10/14 [phone]     Chief Complaint  Patient presents with  . Knee Pain     (Consider location/radiation/quality/duration/timing/severity/associated sxs/prior Treatment) Patient is a 68 y.o. male presenting with knee pain. The history is provided by the patient.  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 They were helping him get back into his home in the wheelchair patent the patient's knee between the wheelchair and a wall Knee location:  R knee Pain details:    Quality:  Aching, sharp and throbbing   Radiates to:  Does not radiate   Severity:  Moderate   Onset quality:  Gradual   Duration:  1 week   Timing:  Constant   Progression:  Worsening Chronicity:  New Relieved by:  Rest Worsened by:  Activity and bearing weight Ineffective treatments:  Acetaminophen Associated symptoms: swelling   Associated symptoms: no decreased ROM, no fever and no numbness   Associated symptoms comment:  Mild redness over the knee cap for 1 week   Past Medical History  Diagnosis Date  . HYPERLIPIDEMIA 06/17/2009  . HYPERTENSION 07/21/2006  . Raynaud's syndrome 07/21/2006  . VASCULITIS 05/23/2007  . GERD 07/21/2006  . Suicide attempt     Due to Prednisone!  . Gout    Past Surgical History  Procedure Laterality Date  . Hernia repair  7/0/17    umbilical hernia  . Inguinal hernia repair      Bil   Family History  Problem Relation Age of Onset  . Hypertension Father   . Prostate cancer Father 24    never operated, not cause of death  . Stroke Father    History  Substance Use Topics  . Smoking status: Former Smoker -- 0.05 packs/day for 20 years    Types: Cigarettes  . Smokeless tobacco:  Former Systems developer    Types: [place] date: 06/26/1989     Comment: Smoked occasionally  . Alcohol Use: 1.0 oz/week    2 drink(s) per week    Review of Systems  Constitutional: Negative for fever.  All other systems reviewed and are negative.     Allergies  Prednisone; Amoxicillin-pot clavulanate; Ace inhibitors; and Codeine phosphate  Home Medications   Prior to Admission medications   Medication Sig Start Date End Date Taking? Authorizing Provider  aspirin 81 MG tablet Take 81 mg by mouth daily.      Historical Provider, MD  calcium carbonate (TUMS - DOSED IN MG ELEMENTAL CALCIUM) 500 MG chewable tablet Chew 1 tablet by mouth as needed for indigestion or heartburn.    Historical Provider, MD  clonazePAM (KLONOPIN) 1 MG tablet Take 0.5-1 tablets (0.5-1 mg total) by mouth daily as needed. 10/29/13   [name], MD  indomethacin (INDOCIN) 25 MG capsule TAKE 2 CAPSULES 3 TIMES A DAY FOR 48 HOURS THEN TAKE 1 CAPSULE 3 TIMES A DAY UNTIL PAIN IS RESOLVED 06/13/13   Lisabeth Pick, MD  losartan-hydrochlorothiazide (HYZAAR) 100-25 MG per tablet TAKE 1 TABLET BY MOUTH DAILY. 08/10/13   Lisabeth Pick, MD  naproxen (NAPROSYN) 500 MG tablet Take 1 tablet (500 mg total) by mouth 2 (two) times daily.  01/10/14   Blanchie Dessert, MD   BP 150/75 mmHg  Pulse 58  Temp(Src) 98 F (36.7 C) (Oral)  Resp 16  Ht 5\' 11"  (1.803 m)  Wt 163 lb (73.936 kg)  BMI 22.74 kg/m2  SpO2 99% Physical Exam  Constitutional: He is oriented to person, place, and time. He appears well-developed and well-nourished. No distress.  HENT:  Head: Normocephalic and atraumatic.  Eyes: EOM are normal. Pupils are equal, round, and reactive to light.  Cardiovascular: Normal rate.   Pulmonary/Chest: Effort normal.  Musculoskeletal:       Legs: Neurological: He is alert and oriented to person, place, and time.  Skin: Skin is warm and dry.  Psychiatric: He has a normal mood and affect. His behavior is normal.  Nursing  note and vitals reviewed.   ED Course  Procedures (including critical care time) Labs Review Labs Reviewed - No data to display  Imaging Review Dg Knee Complete 4 Views Right  01/10/2014   CLINICAL DATA:  Hit right knee on door 2 weeks ago, patella pain  EXAM: RIGHT KNEE - COMPLETE 4+ VIEW  COMPARISON:  None.  FINDINGS: Four views of the right knee submitted. No acute fracture or subluxation. There is prepatellar soft tissue swelling. Small joint effusion.  IMPRESSION: No acute fracture or subluxation. Prepatellar soft tissue swelling. Small joint effusion.   Electronically Signed   By: Lahoma Crocker M.D.   On: 01/10/2014 09:34     EKG Interpretation None      MDM   Final diagnoses:  Pain  Patellar bursitis of right knee    Patient was appears to be traumatic bursitis in his knee from being pinched between a 500 pound man's wheelchair and a wall. He states the swelling has worsened but the mild redness over his knee has been persistent and unchanged for the last 1 week. He denies any systemic symptoms. He took some Tylenol for the pain and one indomethacin yesterday but has not been icing it, elevating it or taking anything regularly for inflammation. Bursa aspirated with clear, yellow, straw-colored fluid. No appearance of infection at this time feel that the suspicion is low for infection given patient does not have systemic symptoms and there is no sign of worsening redness and patient is able to move the knees through complete range of motion independently with minimal pain.  X-ray was negative for any acute trauma. Patient started on naproxen. Knee wrapped and recommended elevation and ice. Given follow-up with Dr. Barbaraann Barthel on Monday if symptoms are not improving.  Bursa aspirated.  60mL of fluid removed.   Blanchie Dessert, MD 01/10/14 [place], MD 01/10/14 1006

## 2014-01-10 NOTE — Discharge Instructions (Signed)
Bursitis °Bursitis is a swelling and soreness (inflammation) of a fluid-filled sac (bursa) that overlies and protects a joint. It can be caused by injury, overuse of the joint, arthritis or infection. The joints most likely to be affected are the elbows, shoulders, hips and knees. °HOME CARE INSTRUCTIONS  °· Apply ice to the affected area for 15-20 minutes each hour while awake for 2 days. Put the ice in a plastic bag and place a towel between the bag of ice and your skin. °· Rest the injured joint as much as possible, but continue to put the joint through a full range of motion, 4 times per day. (The shoulder joint especially becomes rapidly "frozen" if not used.) When the pain lessens, begin normal slow movements and usual activities. °· Only take over-the-counter or prescription medicines for pain, discomfort or fever as directed by your caregiver. °· Your caregiver may recommend draining the bursa and injecting medicine into the bursa. This may help the healing process. °· Follow all instructions for follow-up with your caregiver. This includes any orthopedic referrals, physical therapy and rehabilitation. Any delay in obtaining necessary care could result in a delay or failure of the bursitis to heal and chronic pain. °SEEK IMMEDIATE MEDICAL CARE IF:  °· Your pain increases even during treatment. °· You develop an oral temperature above 102° F (38.9° C) and have heat and inflammation over the involved bursa. °MAKE SURE YOU:  °· Understand these instructions. °· Will watch your condition. °· Will get help right away if you are not doing well or get worse. °Document Released: 01/02/2000 Document Revised: 03/29/2011 Document Reviewed: 03/26/2013 °ExitCare® Patient Information ©2015 [employer]. This information is not intended to replace advice given to you by your health care provider. Make sure you discuss any questions you have with your health care provider. ° °

## 2014-01-21 ENCOUNTER — Ambulatory Visit (INDEPENDENT_AMBULATORY_CARE_PROVIDER_SITE_OTHER): Payer: Medicare Other | Admitting: Family Medicine

## 2014-01-21 ENCOUNTER — Encounter: Payer: Self-pay | Admitting: Family Medicine

## 2014-01-21 VITALS — BP 145/74 | HR 61 | Ht 71.0 in | Wt 165.0 lb

## 2014-01-21 DIAGNOSIS — M7041 Prepatellar bursitis, right knee: Secondary | ICD-10-CM | POA: Diagnosis not present

## 2014-01-21 NOTE — ED Notes (Signed)
Patient called to state that the swelling in his knee has returned and wants to know if he should call the ortho md or come back to the ed.  Encouraged to call the ortho md for a follow up today, and if symptoms worsen to come back to the ed asap.

## 2014-01-21 NOTE — Patient Instructions (Signed)
You have prepatellar bursitis. I would not recommend pulling the fluid off this again unless it gets very red, you have a fever above 100.4, or it's very uncomfortable. Otherwise take naproxen (you can switch to 2 aleve twice a day with food when you run out of this). Icing 15 minutes at a time 3-4 times a day until this resolves. ACE wrap for compression to keep swelling down and help this resolve. Elevate as needed. Follow up with me as needed.

## 2014-01-22 DIAGNOSIS — M7041 Prepatellar bursitis, right knee: Secondary | ICD-10-CM | POA: Insufficient documentation

## 2014-01-22 NOTE — Progress Notes (Signed)
PCP: [name], MD  Subjective:   HPI: Patient is a 69 y.o. male here for right knee swelling.  Patient reports he was helping someone on 12/19 in a wheelchair through a door when he believes he struck right knee on door frame or the wheelchair. + swelling, warmth, redness Went to ED on 12/24 and had clear yellow fluid drawn off prepatellar bursa. No fevers. States swelling has come back some. Has a history of gout but not of the knee.  Past Medical History  Diagnosis Date  . HYPERLIPIDEMIA 06/17/2009  . HYPERTENSION 07/21/2006  . Raynaud's syndrome 07/21/2006  . VASCULITIS 05/23/2007  . GERD 07/21/2006  . Suicide attempt     Due to Prednisone!  . Gout     Current Outpatient Prescriptions on File Prior to Visit  Medication Sig Dispense Refill  . aspirin 81 MG tablet Take 81 mg by mouth daily.      . calcium carbonate (TUMS - DOSED IN MG ELEMENTAL CALCIUM) 500 MG chewable tablet Chew 1 tablet by mouth as needed for indigestion or heartburn.    . clonazePAM (KLONOPIN) 1 MG tablet Take 0.5-1 tablets (0.5-1 mg total) by mouth daily as needed. 15 tablet 2  . indomethacin (INDOCIN) 25 MG capsule TAKE 2 CAPSULES 3 TIMES A DAY FOR 48 HOURS THEN TAKE 1 CAPSULE 3 TIMES A DAY UNTIL PAIN IS RESOLVED 60 capsule 5  . losartan-hydrochlorothiazide (HYZAAR) 100-25 MG per tablet TAKE 1 TABLET BY MOUTH DAILY. 90 tablet 3  . naproxen (NAPROSYN) 500 MG tablet Take 1 tablet (500 mg total) by mouth 2 (two) times daily. 30 tablet 0   No current facility-administered medications on file prior to visit.    Past Surgical History  Procedure Laterality Date  . Hernia repair  02/18/17    umbilical hernia  . Inguinal hernia repair      Bil    Allergies  Allergen Reactions  . Prednisone     Patient attributes suicide attempt to prednisone use.   Marland Kitchen Amoxicillin-Pot Clavulanate Nausea And Vomiting  . Ace Inhibitors     REACTION: cough  . Codeine Phosphate     REACTION: vomiting    History   Social  History  . Marital Status: Married    Spouse Name: N/A    Number of Children: N/A  . Years of Education: N/A   Occupational History  . Not on file.   Social History Main Topics  . Smoking status: Former Smoker -- 0.05 packs/day for 20 years    Types: Cigarettes  . Smokeless tobacco: Former Systems developer    Types: [place] date: 06/26/1989     Comment: Smoked occasionally  . Alcohol Use: 1.2 oz/week    2 Not specified per week  . Drug Use: No  . Sexual Activity: Not on file   Other Topics Concern  . Not on file   Social History Narrative   Married 1967. 2 kids. 1 grandchild. 1 greatgrandchilden.       Retired 2003 from [employer]      Hobbies: fishing, shagging, exercise, cruise, 86 GTO convertible    Family History  Problem Relation Age of Onset  . Hypertension Father   . Prostate cancer Father 60    never operated, not cause of death  . Stroke Father     BP 145/74 mmHg  Pulse 61  Ht 5\' 11"  (1.803 m)  Wt 165 lb (74.844 kg)  BMI 23.02 kg/m2  Review of Systems:  See HPI above.    Objective:  Physical Exam:  Gen: NAD  Right knee: Boggy, slight swelling of prepatellar bursa. No TTP currently. FROM. Negative ant/post drawers. Negative valgus/varus testing. Negative lachmanns. Negative mcmurrays, apleys, patellar apprehension. NV intact distally.     Assessment & Plan:  1. Right knee prepatellar bursitis - improved and had 20 mLs straw colored non-cloudy fluid aspirated from here in ED.  No warmth, erythema, evidence of infection.  Advised him to continue with icing, elevation, compression.  Call us if he develops those signs of infection otherwise f/u prn.

## 2014-01-22 NOTE — Assessment & Plan Note (Signed)
improved and had 20 mLs straw colored non-cloudy fluid aspirated from here in ED.  No warmth, erythema, evidence of infection.  Advised him to continue with icing, elevation, compression.  Call us if he develops those signs of infection otherwise f/u prn.

## 2014-02-03 ENCOUNTER — Emergency Department (HOSPITAL_BASED_OUTPATIENT_CLINIC_OR_DEPARTMENT_OTHER)
Admission: EM | Admit: 2014-02-03 | Discharge: 2014-02-03 | Disposition: A | Discharge status: Home / Self Care | Payer: Medicare Other | Attending: Emergency Medicine | Admitting: Emergency Medicine

## 2014-02-03 ENCOUNTER — Encounter (HOSPITAL_BASED_OUTPATIENT_CLINIC_OR_DEPARTMENT_OTHER): Payer: Self-pay | Admitting: Emergency Medicine

## 2014-02-03 DIAGNOSIS — M25561 Pain in right knee: Secondary | ICD-10-CM | POA: Diagnosis present

## 2014-02-03 DIAGNOSIS — K219 Gastro-esophageal reflux disease without esophagitis: Secondary | ICD-10-CM | POA: Diagnosis not present

## 2014-02-03 DIAGNOSIS — Z87891 Personal history of nicotine dependence: Secondary | ICD-10-CM | POA: Diagnosis not present

## 2014-02-03 DIAGNOSIS — Z791 Long term (current) use of non-steroidal anti-inflammatories (NSAID): Secondary | ICD-10-CM | POA: Insufficient documentation

## 2014-02-03 DIAGNOSIS — Z87828 Personal history of other (healed) physical injury and trauma: Secondary | ICD-10-CM | POA: Insufficient documentation

## 2014-02-03 DIAGNOSIS — Z880 Allergy status to penicillin: Secondary | ICD-10-CM | POA: Insufficient documentation

## 2014-02-03 DIAGNOSIS — M10061 Idiopathic gout, right knee: Secondary | ICD-10-CM | POA: Diagnosis not present

## 2014-02-03 DIAGNOSIS — Z7982 Long term (current) use of aspirin: Secondary | ICD-10-CM | POA: Diagnosis not present

## 2014-02-03 DIAGNOSIS — M109 Gout, unspecified: Secondary | ICD-10-CM

## 2014-02-03 DIAGNOSIS — Z8639 Personal history of other endocrine, nutritional and metabolic disease: Secondary | ICD-10-CM | POA: Diagnosis not present

## 2014-02-03 DIAGNOSIS — Z79899 Other long term (current) drug therapy: Secondary | ICD-10-CM | POA: Insufficient documentation

## 2014-02-03 DIAGNOSIS — I10 Essential (primary) hypertension: Secondary | ICD-10-CM | POA: Insufficient documentation

## 2014-02-03 MED ORDER — OXYCODONE-ACETAMINOPHEN 5-325 MG PO TABS
1.0000 | ORAL_TABLET | Freq: Four times a day (QID) | ORAL | Status: DC | PRN
Start: 2014-02-03 — End: 2014-04-30

## 2014-02-03 MED ORDER — LIDOCAINE HCL 2 % IJ SOLN
10.0000 mL | Freq: Once | INTRAMUSCULAR | Status: DC
  Filled 2014-02-03: qty 20

## 2014-02-03 NOTE — Discharge Instructions (Signed)
Take percocet for severe pain only. No driving or operating heavy machinery while taking percocet. This medication may cause drowsiness. Take indomethacin at home as prescribed. Ice and elevate your knee.  Gout Gout is an inflammatory arthritis caused by a buildup of uric acid crystals in the joints. Uric acid is a chemical that is normally present in the blood. When the level of uric acid in the blood is too high it can form crystals that deposit in your joints and tissues. This causes joint redness, soreness, and swelling (inflammation). Repeat attacks are common. Over time, uric acid crystals can form into masses (tophi) near a joint, destroying bone and causing disfigurement. Gout is treatable and often preventable. CAUSES  The disease begins with elevated levels of uric acid in the blood. Uric acid is produced by your body when it breaks down a naturally found substance called purines. Certain foods you eat, such as meats and fish, contain high amounts of purines. Causes of an elevated uric acid level include:  Being passed down from parent to child (heredity).  Diseases that cause increased uric acid production (such as obesity, psoriasis, and certain cancers).  Excessive alcohol use.  Diet, especially diets rich in meat and seafood.  Medicines, including certain cancer-fighting medicines (chemotherapy), water pills (diuretics), and aspirin.  Chronic kidney disease. The kidneys are no longer able to remove uric acid well.  Problems with metabolism. Conditions strongly associated with gout include:  Obesity.  High blood pressure.  High cholesterol.  Diabetes. Not everyone with elevated uric acid levels gets gout. It is not understood why some people get gout and others do not. Surgery, joint injury, and eating too much of certain foods are some of the factors that can lead to gout attacks. SYMPTOMS   An attack of gout comes on quickly. It causes intense pain with redness,  swelling, and warmth in a joint.  Fever can occur.  Often, only one joint is involved. Certain joints are more commonly involved:  Base of the big toe.  Knee.  Ankle.  Wrist.  Finger. Without treatment, an attack usually goes away in a few days to weeks. Between attacks, you usually will not have symptoms, which is different from many other forms of arthritis. DIAGNOSIS  Your caregiver will suspect gout based on your symptoms and exam. In some cases, tests may be recommended. The tests may include:  Blood tests.  Urine tests.  X-rays.  Joint fluid exam. This exam requires a needle to remove fluid from the joint (arthrocentesis). Using a microscope, gout is confirmed when uric acid crystals are seen in the joint fluid. TREATMENT  There are two phases to gout treatment: treating the sudden onset (acute) attack and preventing attacks (prophylaxis).  Treatment of an Acute Attack.  Medicines are used. These include anti-inflammatory medicines or steroid medicines.  An injection of steroid medicine into the affected joint is sometimes necessary.  The painful joint is rested. Movement can worsen the arthritis.  You may use warm or cold treatments on painful joints, depending which works best for you.  Treatment to Prevent Attacks.  If you suffer from frequent gout attacks, your caregiver may advise preventive medicine. These medicines are started after the acute attack subsides. These medicines either help your kidneys eliminate uric acid from your body or decrease your uric acid production. You may need to stay on these medicines for a very long time.  The early phase of treatment with preventive medicine can be associated with an increase  in acute gout attacks. For this reason, during the first few months of treatment, your caregiver may also advise you to take medicines usually used for acute gout treatment. Be sure you understand your caregiver's directions. Your caregiver may  make several adjustments to your medicine dose before these medicines are effective.  Discuss dietary treatment with your caregiver or dietitian. Alcohol and drinks high in sugar and fructose and foods such as meat, poultry, and seafood can increase uric acid levels. Your caregiver or dietitian can advise you on drinks and foods that should be limited. HOME CARE INSTRUCTIONS   Do not take aspirin to relieve pain. This raises uric acid levels.  Only take over-the-counter or prescription medicines for pain, discomfort, or fever as directed by your caregiver.  Rest the joint as much as possible. When in bed, keep sheets and blankets off painful areas.  Keep the affected joint raised (elevated).  Apply warm or cold treatments to painful joints. Use of warm or cold treatments depends on which works best for you.  Use crutches if the painful joint is in your leg.  Drink enough fluids to keep your urine clear or pale yellow. This helps your body get rid of uric acid. Limit alcohol, sugary drinks, and fructose drinks.  Follow your dietary instructions. Pay careful attention to the amount of protein you eat. Your daily diet should emphasize fruits, vegetables, whole grains, and fat-free or low-fat milk products. Discuss the use of coffee, vitamin C, and cherries with your caregiver or dietitian. These may be helpful in lowering uric acid levels.  Maintain a healthy body weight. SEEK MEDICAL CARE IF:   You develop diarrhea, vomiting, or any side effects from medicines.  You do not feel better in 24 hours, or you are getting worse. SEEK IMMEDIATE MEDICAL CARE IF:   Your joint becomes suddenly more tender, and you have chills or a fever. MAKE SURE YOU:   Understand these instructions.  Will watch your condition.  Will get help right away if you are not doing well or get worse. Document Released: 01/02/2000 Document Revised: 05/21/2013 Document Reviewed: 08/18/2011 [facility] Patient  Information 2015 [place], [place]. This information is not intended to replace advice given to you by your health care provider. Make sure you discuss any questions you have with your health care provider.

## 2014-02-03 NOTE — ED Notes (Addendum)
PT presents to ED with complaints of right knee pain. Pt has been seen at PCP and here for the same. States he has had fluid pulled off knee and that helped but he is afraid he has a blood clot now. PT also reports an injury to that knee a month ago.

## 2014-02-03 NOTE — ED Provider Notes (Signed)
CSN: [id number]     Arrival date & time 02/03/14  1150 History   First MD Initiated Contact with Patient 02/03/14 1204     Chief Complaint  Patient presents with  . Knee Pain     (Consider location/radiation/quality/duration/timing/severity/associated sxs/prior Treatment) HPI Comments: 69 y/o male with a PMHx of gout, HTN and vasculitis presenting with right knee pain x 2 days. Pt reports he had his knee aspirated on 12/24 here in the ED and diagnosed with bursitis, saw Dr. Barbaraann Barthel on 1/5 who stated it was bursitis and did not aspirate further at that time. Initial knee pain started after an injury while helping a 500 lb man in a wheel chair. No new injury. Yesterday his knee started to swell, turn slightly red, and pain increased overnight causing discomfort especially when his knee touched the sheets. He tried taking indomethacin with mild relief. Denies fever or chills. No numbness or tingling.  Patient is a 69 y.o. male presenting with knee pain. The history is provided by the patient.  Knee Pain   Past Medical History  Diagnosis Date  . HYPERLIPIDEMIA 06/17/2009  . HYPERTENSION 07/21/2006  . Raynaud's syndrome 07/21/2006  . VASCULITIS 05/23/2007  . GERD 07/21/2006  . Suicide attempt     Due to Prednisone!  . Gout    Past Surgical History  Procedure Laterality Date  . Hernia repair  09/24/92    umbilical hernia  . Inguinal hernia repair      Bil   Family History  Problem Relation Age of Onset  . Hypertension Father   . Prostate cancer Father 28    never operated, not cause of death  . Stroke Father    History  Substance Use Topics  . Smoking status: Former Smoker -- 0.05 packs/day for 20 years    Types: Cigarettes  . Smokeless tobacco: Former Systems developer    Types: [place] date: 06/26/1989     Comment: Smoked occasionally  . Alcohol Use: 1.2 oz/week    2 Not specified per week    Review of Systems  Musculoskeletal:       + R knee pain and swelling.  Skin: Positive for  color change.  All other systems reviewed and are negative.     Allergies  Prednisone; Amoxicillin-pot clavulanate; Ace inhibitors; and Codeine phosphate  Home Medications   Prior to Admission medications   Medication Sig Start Date End Date Taking? Authorizing Provider  aspirin 81 MG tablet Take 81 mg by mouth daily.      Historical Provider, MD  calcium carbonate (TUMS - DOSED IN MG ELEMENTAL CALCIUM) 500 MG chewable tablet Chew 1 tablet by mouth as needed for indigestion or heartburn.    Historical Provider, MD  clonazePAM (KLONOPIN) 1 MG tablet Take 0.5-1 tablets (0.5-1 mg total) by mouth daily as needed. 10/29/13   [name], MD  indomethacin (INDOCIN) 25 MG capsule TAKE 2 CAPSULES 3 TIMES A DAY FOR 48 HOURS THEN TAKE 1 CAPSULE 3 TIMES A DAY UNTIL PAIN IS RESOLVED 06/13/13   Lisabeth Pick, MD  losartan-hydrochlorothiazide (HYZAAR) 100-25 MG per tablet TAKE 1 TABLET BY MOUTH DAILY. 08/10/13   Lisabeth Pick, MD  naproxen (NAPROSYN) 500 MG tablet Take 1 tablet (500 mg total) by mouth 2 (two) times daily. 01/10/14   Blanchie Dessert, MD  oxyCODONE-acetaminophen (PERCOCET) 5-325 MG per tablet Take 1-2 tablets by mouth every 6 (six) hours as needed for severe pain. 02/03/14   Hessie Diener  Deisha Stull, PA-C   BP 151/66 mmHg  Pulse 57  Temp(Src) 97.9 F (36.6 C) (Oral)  Resp 20  Ht 5\' 11"  (1.803 m)  Wt 165 lb (74.844 kg)  BMI 23.02 kg/m2  SpO2 100% Physical Exam  Constitutional: He is oriented to person, place, and time. He appears well-developed and well-nourished. No distress.  HENT:  Head: Normocephalic and atraumatic.  Eyes: Conjunctivae and EOM are normal.  Neck: Normal range of motion. Neck supple.  Cardiovascular: Normal rate, regular rhythm, normal heart sounds and intact distal pulses.   Pulmonary/Chest: Effort normal and breath sounds normal.  Musculoskeletal: Normal range of motion.  R knee swollen over patella with erythema and warmth. FROM without discomfort. Skin intact.   Neurological: He is alert and oriented to person, place, and time.  Skin: Skin is warm and dry.  Psychiatric: He has a normal mood and affect. His behavior is normal.  Nursing note and vitals reviewed.   ED Course  Procedures (including critical care time) Labs Review Labs Reviewed - No data to display  Imaging Review No results found.   EKG Interpretation None      MDM   Final diagnoses:  Acute gout of right knee, unspecified cause   Pt in NAD. AFVSS. Doubt infection, no systemic symptoms. More likely acute gout over bursitis and pt has hx, and symptoms recurring without further trauma. Symptoms mildly improved with indomethacin at home. Advised him to continue indomethacin, and will give percocet for acute pain. F/u with Dr. Barbaraann Barthel and PCP. No aspiration at this time, as fluid will most likely come right back and he is not in a great deal of discomfort and has FROM without difficulty. Stable for d/c. Return precautions given. Patient states understanding of treatment care plan and is agreeable.  Discussed with attending Dr. Tomi Bamberger who agrees with plan of care.   Carman Ching, PA-C 02/03/14 1233  Dorie Rank, MD 02/03/14 [phone]

## 2014-02-18 ENCOUNTER — Ambulatory Visit: Payer: Medicare Other | Admitting: Family Medicine

## 2014-04-09 DIAGNOSIS — H524 Presbyopia: Secondary | ICD-10-CM | POA: Diagnosis not present

## 2014-04-09 DIAGNOSIS — H52203 Unspecified astigmatism, bilateral: Secondary | ICD-10-CM | POA: Diagnosis not present

## 2014-04-09 DIAGNOSIS — H2513 Age-related nuclear cataract, bilateral: Secondary | ICD-10-CM | POA: Diagnosis not present

## 2014-04-09 DIAGNOSIS — H5203 Hypermetropia, bilateral: Secondary | ICD-10-CM | POA: Diagnosis not present

## 2014-04-30 ENCOUNTER — Encounter: Payer: Self-pay | Admitting: Family Medicine

## 2014-04-30 ENCOUNTER — Ambulatory Visit (INDEPENDENT_AMBULATORY_CARE_PROVIDER_SITE_OTHER): Payer: Medicare Other | Admitting: Family Medicine

## 2014-04-30 ENCOUNTER — Other Ambulatory Visit: Payer: Self-pay | Admitting: Family Medicine

## 2014-04-30 VITALS — BP 110/64 | HR 57 | Temp 97.5°F | Wt 164.0 lb

## 2014-04-30 DIAGNOSIS — I10 Essential (primary) hypertension: Secondary | ICD-10-CM | POA: Diagnosis not present

## 2014-04-30 DIAGNOSIS — Z23 Encounter for immunization: Secondary | ICD-10-CM | POA: Diagnosis not present

## 2014-04-30 DIAGNOSIS — R351 Nocturia: Secondary | ICD-10-CM

## 2014-04-30 DIAGNOSIS — N5089 Other specified disorders of the male genital organs: Secondary | ICD-10-CM

## 2014-04-30 DIAGNOSIS — E785 Hyperlipidemia, unspecified: Secondary | ICD-10-CM

## 2014-04-30 DIAGNOSIS — M1A0790 Idiopathic chronic gout, unspecified ankle and foot, without tophus (tophi): Secondary | ICD-10-CM

## 2014-04-30 DIAGNOSIS — N401 Enlarged prostate with lower urinary tract symptoms: Secondary | ICD-10-CM | POA: Diagnosis not present

## 2014-04-30 DIAGNOSIS — N508 Other specified disorders of male genital organs: Secondary | ICD-10-CM

## 2014-04-30 DIAGNOSIS — Z87891 Personal history of nicotine dependence: Secondary | ICD-10-CM

## 2014-04-30 LAB — COMPREHENSIVE METABOLIC PANEL
ALBUMIN: 4.2 g/dL (ref 3.5–5.2)
ALK PHOS: 110 U/L (ref 39–117)
ALT: 11 U/L (ref 0–53)
AST: 15 U/L (ref 0–37)
BUN: 18 mg/dL (ref 6–23)
CO2: 31 meq/L (ref 19–32)
Calcium: 9.8 mg/dL (ref 8.4–10.5)
Chloride: 99 mEq/L (ref 96–112)
Creatinine, Ser: 1.31 mg/dL (ref 0.40–1.50)
GFR: 57.67 mL/min — ABNORMAL LOW (ref >60.00)
Glucose, Bld: 85 mg/dL (ref 70–99)
Potassium: 4.2 mEq/L (ref 3.5–5.1)
Sodium: 137 mEq/L (ref 135–145)
TOTAL PROTEIN: 7.6 g/dL (ref 6.0–8.3)
Total Bilirubin: 1 mg/dL (ref 0.2–1.2)

## 2014-04-30 LAB — POCT URINALYSIS DIPSTICK
BILIRUBIN UA: NEGATIVE
Glucose, UA: NEGATIVE
Ketones, UA: NEGATIVE
Leukocytes, UA: NEGATIVE
NITRITE UA: NEGATIVE
PH UA: 7
PROTEIN UA: NEGATIVE
Spec Grav, UA: 1.015
Urobilinogen, UA: 0.2

## 2014-04-30 LAB — LIPID PANEL
Cholesterol: 194 mg/dL (ref 0–200)
HDL: 30 mg/dL — AB (ref >39.00)
LDL Cholesterol: 127 mg/dL — ABNORMAL HIGH (ref 0–99)
NonHDL: 164
TRIGLYCERIDES: 187 mg/dL — AB (ref 0.0–149.0)
Total CHOL/HDL Ratio: 6
VLDL: 37.4 mg/dL (ref 0.0–40.0)

## 2014-04-30 LAB — CBC
HCT: 43.4 % (ref 39.0–52.0)
Hemoglobin: 14.8 g/dL (ref 13.0–17.0)
MCHC: 34.2 g/dL (ref 30.0–36.0)
MCV: 84.2 fl (ref 78.0–100.0)
PLATELETS: 183 10*3/uL (ref 150.0–400.0)
RBC: 5.15 Mil/uL (ref 4.22–5.81)
RDW: 14.7 % (ref 11.5–15.5)
WBC: 7.7 10*3/uL (ref 4.0–10.5)

## 2014-04-30 LAB — URIC ACID: URIC ACID, SERUM: 7.7 mg/dL (ref 4.0–7.8)

## 2014-04-30 LAB — TSH: TSH: 2.01 u[IU]/mL (ref 0.35–4.50)

## 2014-04-30 LAB — PSA: PSA: 2.17 ng/mL (ref 0.10–4.00)

## 2014-04-30 MED ORDER — ATORVASTATIN CALCIUM 40 MG PO TABS: 40.0000 mg | ORAL_TABLET | ORAL | Status: DC

## 2014-04-30 NOTE — Addendum Note (Signed)
Addended by: Joyce Gross R on: 04/30/2014 02:11 PM   Modules accepted: Orders

## 2014-04-30 NOTE — Assessment & Plan Note (Signed)
S: present for at least 3 years, not growing A/P: not typical age group for testicular cancer, no systemic signs. We discussed ultrasound and may need biopsy as may be hard to characterize but given there has been no growth of lesion we made a joint decision to follow clinically at least every year.

## 2014-04-30 NOTE — Progress Notes (Signed)
[name], MD Phone: [phone]  Subjective:   Kevin Perez is a 69 y.o. year old very pleasant male patient who presents with the following:  Hyperlipidemia-mild poor control Last LDL 107. Trouble with cramps on statins in past. Never once weekly. Needs 10 year risk calculation.   On statin: no Regular exercise: silver sneakers 3 days a week and walks on treadmill before going Weight down 1 lb from last visit.  ROS- no chest pain or shortness of breath. No myalgias  Hypertension-controlled  BP Readings from Last 3 Encounters:  04/30/14 110/64  02/03/14 151/66  01/21/14 145/74   Home BP monitoring- at home 115/59 Compliant with medications-yes without side effects except potentially nocturia.  ROS-Denies any CP, HA, SOB, blurry vision, LE edema.  Gout- worsening control -prepatellar burisitis in December thoguht related to gout. Last uric acid 2009 not elevated. A few weeks ago had erythema, swelling near left 5th toe.  ROS- no hot, swollen joints in last few weeks  Nocturia with BPH- new  Pee anytime he gets up at night but does not wake up to pee. Pees 3x in the AM when he wakes up. Over last 6 months more noticeable  Past Medical History- Patient Active Problem List   Diagnosis Date Noted  . Testicular swelling, left 04/30/2014    Priority: Medium  . Gout 01/08/2014    Priority: Medium  . Insomnia 10/29/2013    Priority: Medium  . Hyperlipidemia 06/17/2009    Priority: Medium  . Arteritis 05/23/2007    Priority: Medium  . Suicide attempt 01/19/2007    Priority: Medium  . Essential hypertension 07/21/2006    Priority: Medium  . Former smoker 04/30/2014    Priority: Low  . Prepatellar bursitis of right knee 01/22/2014    Priority: Low  . GERD 07/21/2006    Priority: Low  . BPH associated with nocturia 04/30/2014   Medications- reviewed and updated Current Outpatient Prescriptions  Medication Sig Dispense Refill  . aspirin 81 MG tablet Take 81 mg  by mouth daily.      . calcium carbonate (TUMS - DOSED IN MG ELEMENTAL CALCIUM) 500 MG chewable tablet Chew 1 tablet by mouth as needed for indigestion or heartburn.    . clonazePAM (KLONOPIN) 1 MG tablet Take 0.5-1 tablets (0.5-1 mg total) by mouth daily as needed. 15 tablet 2  . indomethacin (INDOCIN) 25 MG capsule TAKE 2 CAPSULES 3 TIMES A DAY FOR 48 HOURS THEN TAKE 1 CAPSULE 3 TIMES A DAY UNTIL PAIN IS RESOLVED 60 capsule 5  . losartan-hydrochlorothiazide (HYZAAR) 100-25 MG per tablet TAKE 1 TABLET BY MOUTH DAILY. 90 tablet 3  . naproxen (NAPROSYN) 500 MG tablet Take 1 tablet (500 mg total) by mouth 2 (two) times daily. 30 tablet 0   Objective: BP 110/64 mmHg  Pulse 57  Temp(Src) 97.5 F (36.4 C)  Wt 164 lb (74.39 kg) Gen: NAD, resting comfortably on table CV: RRR no murmurs rubs or gallops, not brady Lungs: CTAB no crackles, wheeze, rhonchi Abdomen: soft/nontender/nondistended/normal bowel sounds.  Ext: no edema Skin: warm, dry, no rash Male genitalia: normal, abnormal findings: on left testicle there is a small bump likely 48mm x 2 mm and raised less than a mm Rectal: normal tone, slightly enlarged prostate with no masses or tenderness Neuro: grossly normal, moves all extremities   Assessment/Plan:  Testicular swelling, left S: present for at least 3 years, not growing A/P: not typical age group for testicular cancer, no systemic signs. We discussed  ultrasound and may need biopsy as may be hard to characterize but given there has been no growth of lesion we made a joint decision to follow clinically at least every year.     Hyperlipidemia Last LDL 107. Trouble with cramps on statins in past. Never once weekly. Trial atorvastatin 40mg  weekly.    Essential hypertension Controlled on Losartan-hctz. No changes.    Gout 2 issues in past 6 months potentially related to gout one being prepatellar bursitis. Check uric acid. Continue prn indomethacin   BPH associated with  nocturia Slightly enlarged prostate on exam with new nocturia. Check UA and urine culture. Check PSA. Likely related to BPH.     6 month f/u.   Fasting labs Orders Placed This Encounter  Procedures  . Urine culture    [facility]  . Pneumococcal conjugate vaccine 13-valent  . CBC    [place]  . Comprehensive metabolic panel    [place]    Order Specific Question:  Has the patient fasted?    Answer:  No  . Lipid panel    [place]    Order Specific Question:  Has the patient fasted?    Answer:  No  . TSH    [place]  . Uric Acid  . PSA  . POCT urinalysis dipstick

## 2014-04-30 NOTE — Assessment & Plan Note (Signed)
2 issues in past 6 months potentially related to gout one being prepatellar bursitis. Check uric acid. Continue prn indomethacin

## 2014-04-30 NOTE — Assessment & Plan Note (Signed)
Last LDL 107. Trouble with cramps on statins in past. Never once weekly. Trial atorvastatin 40mg  weekly.

## 2014-04-30 NOTE — Assessment & Plan Note (Signed)
Controlled on Losartan-hctz. No changes.

## 2014-04-30 NOTE — Patient Instructions (Addendum)
Received final pneumonia shot today ([id number]).  Contact insurance regarding Zostavax.  Bp looks great. If cholesterol still high, would consider once a week medication.   Gout-check uric acid  Peeing at night-make sure no infection in urine, also check PSA. Rectal exam shows slightly enlarged prostate which may be cause of your symptoms.   Spot on left testicle-we agreed to pursue ultrasound if any growth and for clinical exam at least yearly.

## 2014-04-30 NOTE — Assessment & Plan Note (Signed)
Slightly enlarged prostate on exam with new nocturia. Check UA and urine culture. Check PSA. Likely related to BPH.

## 2014-05-01 LAB — URINALYSIS, MICROSCOPIC ONLY
BACTERIA UA: NONE SEEN
CASTS: NONE SEEN
Crystals: NONE SEEN
Squamous Epithelial / LPF: NONE SEEN

## 2014-05-02 LAB — URINE CULTURE
COLONY COUNT: NO GROWTH
Organism ID, Bacteria: NO GROWTH

## 2014-05-06 ENCOUNTER — Encounter: Payer: Self-pay | Admitting: Family Medicine

## 2014-05-06 MED ORDER — ALLOPURINOL 100 MG PO TABS: 100.0000 mg | ORAL_TABLET | Freq: Every day | ORAL | Status: DC

## 2014-05-06 NOTE — Telephone Encounter (Signed)
Have patient take allopurinol 100mg  (I sent in) daily. For first 2 weeks have him take 1 indomethacin 25mg  twice a day as sometimes getting on allopurinol will cause gout flares and this can help prevent that.

## 2014-07-01 DIAGNOSIS — M2042 Other hammer toe(s) (acquired), left foot: Secondary | ICD-10-CM | POA: Diagnosis not present

## 2014-08-04 ENCOUNTER — Other Ambulatory Visit: Payer: Self-pay | Admitting: Internal Medicine

## 2014-08-07 DIAGNOSIS — M2042 Other hammer toe(s) (acquired), left foot: Secondary | ICD-10-CM | POA: Diagnosis not present

## 2014-08-07 DIAGNOSIS — S93402A Sprain of unspecified ligament of left ankle, initial encounter: Secondary | ICD-10-CM | POA: Diagnosis not present

## 2014-08-15 DIAGNOSIS — M2040 Other hammer toe(s) (acquired), unspecified foot: Secondary | ICD-10-CM | POA: Diagnosis not present

## 2014-08-15 DIAGNOSIS — M2042 Other hammer toe(s) (acquired), left foot: Secondary | ICD-10-CM | POA: Diagnosis not present

## 2014-08-19 DIAGNOSIS — Z09 Encounter for follow-up examination after completed treatment for conditions other than malignant neoplasm: Secondary | ICD-10-CM | POA: Diagnosis not present

## 2014-08-19 DIAGNOSIS — M2042 Other hammer toe(s) (acquired), left foot: Secondary | ICD-10-CM | POA: Diagnosis not present

## 2014-09-02 DIAGNOSIS — Z09 Encounter for follow-up examination after completed treatment for conditions other than malignant neoplasm: Secondary | ICD-10-CM | POA: Diagnosis not present

## 2014-09-02 DIAGNOSIS — M2042 Other hammer toe(s) (acquired), left foot: Secondary | ICD-10-CM | POA: Diagnosis not present

## 2014-09-02 DIAGNOSIS — M109 Gout, unspecified: Secondary | ICD-10-CM | POA: Diagnosis not present

## 2014-09-04 DIAGNOSIS — H40003 Preglaucoma, unspecified, bilateral: Secondary | ICD-10-CM | POA: Diagnosis not present

## 2014-09-04 DIAGNOSIS — Z87891 Personal history of nicotine dependence: Secondary | ICD-10-CM | POA: Diagnosis not present

## 2014-09-04 DIAGNOSIS — Z885 Allergy status to narcotic agent status: Secondary | ICD-10-CM | POA: Diagnosis not present

## 2014-09-04 DIAGNOSIS — H259 Unspecified age-related cataract: Secondary | ICD-10-CM | POA: Diagnosis not present

## 2014-09-04 DIAGNOSIS — H35711 Central serous chorioretinopathy, right eye: Secondary | ICD-10-CM | POA: Diagnosis not present

## 2014-09-05 ENCOUNTER — Telehealth: Payer: Self-pay | Admitting: Family Medicine

## 2014-09-05 ENCOUNTER — Encounter: Payer: Self-pay | Admitting: Family Medicine

## 2014-09-05 ENCOUNTER — Ambulatory Visit (INDEPENDENT_AMBULATORY_CARE_PROVIDER_SITE_OTHER): Payer: Medicare Other | Admitting: Family Medicine

## 2014-09-05 VITALS — BP 128/70 | HR 61 | Temp 97.5°F | Wt 161.0 lb

## 2014-09-05 DIAGNOSIS — M10072 Idiopathic gout, left ankle and foot: Secondary | ICD-10-CM

## 2014-09-05 DIAGNOSIS — M109 Gout, unspecified: Secondary | ICD-10-CM

## 2014-09-05 MED ORDER — INDOMETHACIN 25 MG PO CAPS: 25.0000 mg | ORAL_CAPSULE | Freq: Three times a day (TID) | ORAL | Status: DC | PRN

## 2014-09-05 MED ORDER — COLCHICINE 0.6 MG PO TABS: ORAL_TABLET | ORAL | Status: DC

## 2014-09-05 NOTE — Telephone Encounter (Signed)
fyi

## 2014-09-05 NOTE — Telephone Encounter (Signed)
Patient Name: Kevin Perez DOB: 1945-08-29 Initial Comment Caller states he had his stitches out on Mondaysurgery from [facility]. He states it's still hard to walk on his foot. Foot pain. Nurse Assessment Nurse: Vallery Sa, RN, Cathy Date/Time (Eastern Time): 09/05/2014 9:14:49 AM Confirm and document reason for call. If symptomatic, describe symptoms. ---Caller states he had Hammertoe surgery August 15, 2014 and his foot and ankle are very painful, red and inflamed. No known fever. No drainage. Has the patient traveled out of the country within the last 30 days? ---No Does the patient require triage? ---Yes Related visit to physician within the last 2 weeks? ---Yes Does the PT have any chronic conditions? (i.e. diabetes, asthma, etc.) ---Yes List chronic conditions. ---[name] toe surgery August 15, 2014, High Blood Pressure, Gout? Guidelines Guideline Title Affirmed Question Affirmed Notes Ankle Swelling [1] Redness AND [2] painful when touched AND [3] no fever Final Disposition User See Physician within 4 Hours (or PCP triage) Vallery Sa, RN, Cathy Comments Scheduled with Dr. Elease Hashimoto at 11:45am today. [name] unhappy that his MD didn't have an opening. Referrals REFERRED TO PCP OFFICE Disagree/Comply: Comply

## 2014-09-05 NOTE — Patient Instructions (Signed)

## 2014-09-05 NOTE — Progress Notes (Signed)
   Subjective:    Patient ID: Kevin Perez, male    DOB: 1945/07/20, 68 y.o.   MRN: [id number]  HPI Patient seen with some pain and redness involving his left foot-mostly metatarsophalangeal joint but also to a lesser extent ankle. He had recent foot surgery on the same foot for hammertoe July 28. He is recovered well since that surgery. Noted onset of redness swelling couple days ago. Denies any fever or chills. He's been taking some indomethacin but low-dose 25 mg about twice daily without much improvement yet. Prior intolerance with prednisone. He does have prior history of gout. He is on low-dose allopurinol 100 mg once daily. He apparently had uric acid level drawn yesterday by his podiatrist. He has not heard back yet  Past Medical History  Diagnosis Date  . HYPERLIPIDEMIA 06/17/2009  . HYPERTENSION 07/21/2006  . Raynaud's syndrome 07/21/2006  . VASCULITIS 05/23/2007  . GERD 07/21/2006  . Suicide attempt     Due to Prednisone!  . Gout    Past Surgical History  Procedure Laterality Date  . Hernia repair  06/20/35    umbilical hernia  . Inguinal hernia repair      [place]il    reports that he has quit smoking. His smoking use included Cigarettes. He has a 1 pack-year smoking history. He quit smokeless tobacco use about 25 years ago. His smokeless tobacco use included Chew. He reports that he drinks about 1.2 oz of alcohol per week. He reports that he does not use illicit drugs. family history includes Hypertension in his father; Prostate cancer (age of onset: 63) in his father; Stroke in his father. Allergies  Allergen Reactions  . Prednisone     Patient attributes suicide attempt to prednisone use.   Marland Kitchen Amoxicillin-Pot Clavulanate Nausea And Vomiting  . Ace Inhibitors     REACTION: cough  . Codeine Phosphate     REACTION: vomiting      Review of Systems  Constitutional: Negative for fever and chills.  Gastrointestinal: Negative for nausea and vomiting.       Objective:   Physical  Exam  Constitutional: He appears well-developed and well-nourished.  Cardiovascular: Normal rate and regular rhythm.   Pulmonary/Chest: Effort normal and breath sounds normal. No respiratory distress. He has no wheezes. He has no rales.  Musculoskeletal:  Left foot reveals wounds involving the fourth and fifth toes are healing well with no surrounding erythema. He has some mild erythema, warmth, and tenderness involving the metatarsophalangeal joint and medial aspect of ankle. No skin lesions. No pustules.          Assessment & Plan:  Suspect acute gout left foot. Previous intolerance of prednisone. He is currently taking suboptimal doses of indomethacin and will first try increasing this to 50 mg 3 times a day. If no improvement in a couple days will add Colcrys 0.6 mg twice a day. Follow-up with primary to confirm uric acid at goal with Allopurinol therapy

## 2014-09-05 NOTE — Progress Notes (Signed)
Pre visit review using our clinic review tool, if applicable. No additional management support is needed unless otherwise documented below in the visit note. 

## 2014-09-05 NOTE — Telephone Encounter (Signed)
Im willing to work folks in if needed. Looks like this was auto scheduled by [facility] though. Dr. Elease Hashimoto- sorry but thank you for your help.

## 2014-09-30 DIAGNOSIS — Z09 Encounter for follow-up examination after completed treatment for conditions other than malignant neoplasm: Secondary | ICD-10-CM | POA: Diagnosis not present

## 2014-09-30 DIAGNOSIS — M2042 Other hammer toe(s) (acquired), left foot: Secondary | ICD-10-CM | POA: Diagnosis not present

## 2014-11-24 ENCOUNTER — Other Ambulatory Visit: Payer: Self-pay | Admitting: Family Medicine

## 2014-11-25 ENCOUNTER — Ambulatory Visit (INDEPENDENT_AMBULATORY_CARE_PROVIDER_SITE_OTHER): Payer: Medicare Other | Admitting: Family Medicine

## 2014-11-25 ENCOUNTER — Encounter: Payer: Self-pay | Admitting: Family Medicine

## 2014-11-25 VITALS — BP 120/60 | HR 61 | Temp 97.5°F | Wt 157.0 lb

## 2014-11-25 DIAGNOSIS — M1A0790 Idiopathic chronic gout, unspecified ankle and foot, without tophus (tophi): Secondary | ICD-10-CM | POA: Diagnosis not present

## 2014-11-25 DIAGNOSIS — E785 Hyperlipidemia, unspecified: Secondary | ICD-10-CM | POA: Diagnosis not present

## 2014-11-25 DIAGNOSIS — I10 Essential (primary) hypertension: Secondary | ICD-10-CM | POA: Diagnosis not present

## 2014-11-25 LAB — URIC ACID: Uric Acid, Serum: 5.6 mg/dL (ref 4.0–7.8)

## 2014-11-25 LAB — LDL CHOLESTEROL, DIRECT: LDL DIRECT: 107 mg/dL

## 2014-11-25 NOTE — Patient Instructions (Signed)
Labs before you go  So sorry to hear the news about your wife. I will certainly be praying for you.   If you have another gout attack- please let me know- I may stop the HCTZ portion in your medicine then have you back for repeat blood pressure within a month  Blood pressure looks great today though

## 2014-11-25 NOTE — Assessment & Plan Note (Signed)
S: Since last visit- 1 more gout attack. Uric acid today <6 so controlled A/P: we discussed that HCTZ may need to be removed if has flares despite uric acid <6. Continue allopurinol 100mg 

## 2014-11-25 NOTE — Assessment & Plan Note (Signed)
S: LDL near goal <100 Lab Results  Component Value Date   CHOL 194 04/30/2014   HDL 30.00* 04/30/2014   LDLCALC 127* 04/30/2014   LDLDIRECT 107.0 11/25/2014   TRIG 187.0* 04/30/2014   CHOLHDL 6 04/30/2014  A/P: LDL down 20 points from April with once a week atorvastatin- trial twice a week advised through [facility]

## 2014-11-25 NOTE — Assessment & Plan Note (Signed)
S: controlled. On losartan 100- hctz 25 mg daily  BP Readings from Last 3 Encounters:  11/25/14 120/60  09/05/14 128/70  04/30/14 110/64  A/P:Continue current meds as doing well

## 2014-11-25 NOTE — Progress Notes (Signed)
[name], MD  Subjective:  Kevin Perez is a 69 y.o. year old very pleasant male patient who presents for/with See problem oriented charting ROS- No chest pain or shortness of breath. No headache or blurry vision.   Past Medical History-  Patient Active Problem List   Diagnosis Date Noted  . Testicular swelling, left 04/30/2014    Priority: Medium  . Gout 01/08/2014    Priority: Medium  . Insomnia 10/29/2013    Priority: Medium  . Hyperlipidemia 06/17/2009    Priority: Medium  . Arteritis ([place]) 05/23/2007    Priority: Medium  . Suicide attempt ([place]) 01/19/2007    Priority: Medium  . Essential hypertension 07/21/2006    Priority: Medium  . Former smoker 04/30/2014    Priority: Low  . Prepatellar bursitis of right knee 01/22/2014    Priority: Low  . GERD 07/21/2006    Priority: Low  . BPH associated with nocturia 04/30/2014    Medications- reviewed and updated Current Outpatient Prescriptions  Medication Sig Dispense Refill  . allopurinol (ZYLOPRIM) 100 MG tablet TAKE 1 TABLET (100 MG TOTAL) BY MOUTH DAILY. 30 tablet 6  . aspirin 81 MG tablet Take 81 mg by mouth daily.      Marland Kitchen atorvastatin (LIPITOR) 40 MG tablet Take 1 tablet (40 mg total) by mouth once a week. 15 tablet 3  . calcium carbonate (TUMS - DOSED IN MG ELEMENTAL CALCIUM) 500 MG chewable tablet Chew 1 tablet by mouth as needed for indigestion or heartburn.    . clonazePAM (KLONOPIN) 1 MG tablet Take 0.5-1 tablets (0.5-1 mg total) by mouth daily as needed. 15 tablet 2  . colchicine 0.6 MG tablet Take one po bid prn gout flare 60 tablet 1  . indomethacin (INDOCIN) 25 MG capsule Take 1 capsule (25 mg total) by mouth 3 (three) times daily as needed. 60 capsule 5  . losartan-hydrochlorothiazide (HYZAAR) 100-25 MG per tablet TAKE 1 TABLET BY MOUTH EVERY DAY 90 tablet 3  . naproxen (NAPROSYN) 500 MG tablet Take 1 tablet (500 mg total) by mouth 2 (two) times daily. 30 tablet 0   No current facility-administered  medications for this visit.    Objective: BP 120/60 mmHg  Pulse 61  Temp(Src) 97.5 F (36.4 C)  Wt 157 lb (71.215 kg) Gen: NAD, resting comfortably CV: RRR no murmurs rubs or gallops Lungs: CTAB no crackles, wheeze, rhonchi Abdomen: soft/nontender/nondistended/normal bowel sounds. No rebound or guarding.  Ext: no edema Skin: warm, dry Neuro: grossly normal, moves all extremities  Assessment/Plan:  Gout S: Since last visit- 1 more gout attack. Uric acid today <6 so controlled A/P: we discussed that HCTZ may need to be removed if has flares despite uric acid <6. Continue allopurinol 100mg    Essential hypertension S: controlled. On losartan 100- hctz 25 mg daily  BP Readings from Last 3 Encounters:  11/25/14 120/60  09/05/14 128/70  04/30/14 110/64  A/P:Continue current meds as doing well   Hyperlipidemia S: LDL near goal <100 Lab Results  Component Value Date   CHOL 194 04/30/2014   HDL 30.00* 04/30/2014   LDLCALC 127* 04/30/2014   LDLDIRECT 107.0 11/25/2014   TRIG 187.0* 04/30/2014   CHOLHDL 6 04/30/2014  A/P: LDL down 20 points from April with once a week atorvastatin- trial twice a week advised through [facility]  6 month visit- can be AWV  Orders Placed This Encounter  Procedures  . Uric Acid  . LDL cholesterol, direct    [place]   Results  for orders placed or performed in visit on 11/25/14 (from the past 24 hour(s))  Uric Acid     Status: None   Collection Time: 11/25/14 10:09 AM  Result Value Ref Range   Uric Acid, Serum 5.6 4.0 - 7.8 mg/dL  LDL cholesterol, direct     Status: None   Collection Time: 11/25/14 10:09 AM  Result Value Ref Range   Direct LDL 107.0 mg/dL

## 2014-12-30 ENCOUNTER — Telehealth: Payer: Self-pay | Admitting: Family Medicine

## 2014-12-30 MED ORDER — LOSARTAN POTASSIUM 100 MG PO TABS: 100.0000 mg | ORAL_TABLET | Freq: Every day | ORAL | Status: DC

## 2014-12-30 NOTE — Telephone Encounter (Signed)
Called and informed pt of below. Since we are cutting out HCTZ part of losartan do i still send in 100mg  of Losartan or are you changing the dosage?

## 2014-12-30 NOTE — Telephone Encounter (Signed)
We could continue his losartan. The HCTZ portion in the pill can increase risk of gout flare. We could send in the losartan alone for him then recheck within a few weeks. The other alternative is to continue to treat as needed with flares.

## 2014-12-30 NOTE — Telephone Encounter (Signed)
Keep same dosage losartan 100mg . I am happy to see him next week or this week if he is concerned about blood pressure running too high- can use SDA. We may have to add more medication

## 2014-12-30 NOTE — Telephone Encounter (Signed)
Kevin Perez called saying he was told that the Losartan he's taking could possibly help cause his Gout to flare up. Last Thursday he had a flare up and he began taking the Indomethacin to help. He said it helped some but he's still in pain. He's wondering if Dr. Yong Channel will need to change any of his medications. He had a discussion with Dr. Yong Channel regarding this. He's concerned because he doesn't want to change the BP medication due to his BP being steady. Please give him a call regarding this.  Pt's ph# [phone] and [phone] Thank you.

## 2014-12-30 NOTE — Telephone Encounter (Signed)
Medication called in and pt will call us if BP rises.

## 2014-12-30 NOTE — Telephone Encounter (Signed)
See below

## 2015-03-19 ENCOUNTER — Telehealth: Payer: Self-pay | Admitting: Family Medicine

## 2015-03-19 NOTE — Telephone Encounter (Signed)
See him at 11;15 tomorrow- may open slot

## 2015-03-19 NOTE — Telephone Encounter (Signed)
Pt states after being on 2 atorvastatin (LIPITOR) 40 MG tablet /a week,  pt states his joints ache . Pt has dropped back to 1 X week, but pt states he is worse last 2 days. Also pt was to let you know if he had another gout attack, and he did in his foot. Pt would like to see you to discuss. Please advise if ok to see and when.

## 2015-03-19 NOTE — Telephone Encounter (Signed)
See below

## 2015-03-19 NOTE — Telephone Encounter (Signed)
Pt has been sch

## 2015-03-20 ENCOUNTER — Encounter: Payer: Self-pay | Admitting: Family Medicine

## 2015-03-20 ENCOUNTER — Ambulatory Visit (INDEPENDENT_AMBULATORY_CARE_PROVIDER_SITE_OTHER): Payer: Medicare Other | Admitting: Family Medicine

## 2015-03-20 VITALS — BP 128/64 | HR 69 | Temp 97.5°F | Wt 160.0 lb

## 2015-03-20 DIAGNOSIS — M1A0790 Idiopathic chronic gout, unspecified ankle and foot, without tophus (tophi): Secondary | ICD-10-CM

## 2015-03-20 DIAGNOSIS — E785 Hyperlipidemia, unspecified: Secondary | ICD-10-CM | POA: Diagnosis not present

## 2015-03-20 NOTE — Progress Notes (Signed)
[name], MD  Subjective:  Kevin Perez is a 70 y.o. year old very pleasant male patient who presents for/with See problem oriented charting ROS- recently with right ankle hot and swollen, no fever, chills, nausea, vomiting.   Past Medical History-  Patient Active Problem List   Diagnosis Date Noted  . Testicular swelling, left 04/30/2014    Priority: Medium  . Gout 01/08/2014    Priority: Medium  . Insomnia 10/29/2013    Priority: Medium  . Hyperlipidemia 06/17/2009    Priority: Medium  . Arteritis ([place]) 05/23/2007    Priority: Medium  . Suicide attempt ([place]) 01/19/2007    Priority: Medium  . Essential hypertension 07/21/2006    Priority: Medium  . Former smoker 04/30/2014    Priority: Low  . Prepatellar bursitis of right knee 01/22/2014    Priority: Low  . GERD 07/21/2006    Priority: Low  . BPH associated with nocturia 04/30/2014    Medications- reviewed and updated Current Outpatient Prescriptions  Medication Sig Dispense Refill  . allopurinol (ZYLOPRIM) 100 MG tablet TAKE 1 TABLET (100 MG TOTAL) BY MOUTH DAILY. 30 tablet 6  . aspirin 81 MG tablet Take 81 mg by mouth daily.      Marland Kitchen atorvastatin (LIPITOR) 40 MG tablet Take 1 tablet (40 mg total) by mouth once a week. 15 tablet 3  . calcium carbonate (TUMS - DOSED IN MG ELEMENTAL CALCIUM) 500 MG chewable tablet Chew 1 tablet by mouth as needed for indigestion or heartburn.    . losartan (COZAAR) 100 MG tablet Take 1 tablet (100 mg total) by mouth daily. 30 tablet 5  . clonazePAM (KLONOPIN) 1 MG tablet Take 0.5-1 tablets (0.5-1 mg total) by mouth daily as needed. (Patient not taking: Reported on 03/20/2015) 15 tablet 2  . indomethacin (INDOCIN) 25 MG capsule Take 1 capsule (25 mg total) by mouth 3 (three) times daily as needed. (Patient not taking: Reported on 03/20/2015) 60 capsule 5   No current facility-administered medications for this visit.    Objective: BP 128/64 mmHg  Pulse 69  Temp(Src) 97.5 F (36.4  C)  Wt 160 lb (72.576 kg) Gen: NAD, resting comfortably CV: RRR no murmurs rubs or gallops Lungs: CTAB no crackles, wheeze, rhonchi Abdomen: soft/nontender/nondistended/normal bowel sounds. No rebound or guarding.  Ext: no edema, right lateral ankle with mild warmth and swelling and tenderness Skin: warm, dry, no rash or erythema on leg Neuro: grossly normal, moves all extremities  Assessment/Plan:  Hyperlipidemia S: poorly controlled on no medicine- have been trying to gradually increase atorvastatin. Tolerated once a week. On twice a week has developed some myalgias and arthralgias in upper extremities and in back/shoudlers though no stiffness. Warm compresses and tylenol helps. Som ein legs.  Lab Results  Component Value Date   CHOL 194 04/30/2014   HDL 30.00* 04/30/2014   LDLCALC 127* 04/30/2014   LDLDIRECT 107.0 11/25/2014   TRIG 187.0* 04/30/2014   CHOLHDL 6 04/30/2014   A/P: 3 week break then resume once a week atorvastatin- to update me if symptoms do not resolve.     Gout S: compliant with allopurinol. Recently with flare in right ankle- improving now with indomethacin- stoppe dwhen started feeling better but never completely resolved A/P: discussed using indomethacin until resolution. Uric acid is controlled on last few checks- may want to repeat as states has had a few flares (though far more tolerable than they were in the past)

## 2015-03-20 NOTE — Patient Instructions (Signed)
Hold the atorvastatin for 3 weeks to see if muscle aches improve If it does- may restart at once a week  Does appear to have been gout -glad the indomethacin is helping you. Your levels are good as far as uric acid

## 2015-03-20 NOTE — Assessment & Plan Note (Signed)
S: compliant with allopurinol. Recently with flare in right ankle- improving now with indomethacin- stoppe dwhen started feeling better but never completely resolved A/P: discussed using indomethacin until resolution. Uric acid is controlled on last few checks- may want to repeat as states has had a few flares (though far more tolerable than they were in the past)

## 2015-03-20 NOTE — Assessment & Plan Note (Addendum)
S: poorly controlled on no medicine- have been trying to gradually increase atorvastatin. Tolerated once a week. On twice a week has developed some myalgias and arthralgias in upper extremities and in back/shoudlers though no stiffness. Warm compresses and tylenol helps. Som ein legs.  Lab Results  Component Value Date   CHOL 194 04/30/2014   HDL 30.00* 04/30/2014   LDLCALC 127* 04/30/2014   LDLDIRECT 107.0 11/25/2014   TRIG 187.0* 04/30/2014   CHOLHDL 6 04/30/2014   A/P: 3 week break then resume once a week atorvastatin- to update me if symptoms do not resolve.

## 2015-04-23 ENCOUNTER — Ambulatory Visit (INDEPENDENT_AMBULATORY_CARE_PROVIDER_SITE_OTHER): Payer: Medicare Other | Admitting: Family Medicine

## 2015-04-23 ENCOUNTER — Encounter: Payer: Self-pay | Admitting: Family Medicine

## 2015-04-23 VITALS — BP 110/60 | HR 72 | Temp 97.7°F | Wt 162.0 lb

## 2015-04-23 DIAGNOSIS — E785 Hyperlipidemia, unspecified: Secondary | ICD-10-CM

## 2015-04-23 DIAGNOSIS — M25511 Pain in right shoulder: Secondary | ICD-10-CM | POA: Diagnosis not present

## 2015-04-23 DIAGNOSIS — M25512 Pain in left shoulder: Secondary | ICD-10-CM | POA: Diagnosis not present

## 2015-04-23 MED ORDER — MELOXICAM 7.5 MG PO TABS: 7.5000 mg | ORAL_TABLET | Freq: Every day | ORAL | Status: DC

## 2015-04-23 NOTE — Progress Notes (Signed)
Kevin Reddish, MD  Subjective:  Kevin Perez is a 70 y.o. year old very pleasant male patient who presents for/with See problem oriented charting ROS- see ros below  Past Medical History-  Patient Active Problem List   Diagnosis Date Noted  . Testicular swelling, left 04/30/2014    Priority: Medium  . Gout 01/08/2014    Priority: Medium  . Insomnia 10/29/2013    Priority: Medium  . Hyperlipidemia 06/17/2009    Priority: Medium  . Arteritis ([place]) 05/23/2007    Priority: Medium  . Suicide attempt ([place]) 01/19/2007    Priority: Medium  . Essential hypertension 07/21/2006    Priority: Medium  . Former smoker 04/30/2014    Priority: Low  . Prepatellar bursitis of right knee 01/22/2014    Priority: Low  . GERD 07/21/2006    Priority: Low  . BPH associated with nocturia 04/30/2014    Medications- reviewed and updated Current Outpatient Prescriptions  Medication Sig Dispense Refill  . allopurinol (ZYLOPRIM) 100 MG tablet TAKE 1 TABLET (100 MG TOTAL) BY MOUTH DAILY. 30 tablet 6  . aspirin 81 MG tablet Take 81 mg by mouth daily.      Marland Kitchen atorvastatin (LIPITOR) 40 MG tablet Take 1 tablet (40 mg total) by mouth once a week. 15 tablet 3  . calcium carbonate (TUMS - DOSED IN MG ELEMENTAL CALCIUM) 500 MG chewable tablet Chew 1 tablet by mouth as needed for indigestion or heartburn.    . losartan (COZAAR) 100 MG tablet Take 1 tablet (100 mg total) by mouth daily. 30 tablet 5  . clonazePAM (KLONOPIN) 1 MG tablet Take 0.5-1 tablets (0.5-1 mg total) by mouth daily as needed. (Patient not taking: Reported on 03/20/2015) 15 tablet 2  . indomethacin (INDOCIN) 25 MG capsule Take 1 capsule (25 mg total) by mouth 3 (three) times daily as needed. (Patient not taking: Reported on 03/20/2015) 60 capsule 5  . meloxicam (MOBIC) 7.5 MG tablet Take 1 tablet (7.5 mg total) by mouth daily. Do not take ibuprofen/aleve/motrin/etc with this. Only tylenol 15 tablet 0   No current facility-administered  medications for this visit.    Objective: BP 110/60 mmHg  Pulse 72  Temp(Src) 97.7 F (36.5 C)  Wt 162 lb (73.483 kg) Gen: NAD, resting comfortably CV: RRR no murmurs rubs or gallops Lungs: CTAB no crackles, wheeze, rhonchi Abdomen: soft/nontender/nondistended/normal bowel sounds. No rebound or guarding.  Ext: no edema Skin: warm, dry Neuro: grossly normal, moves all extremities  Bilateral Shoulder: Inspection reveals no abnormalities, atrophy or asymmetry. Palpation is normal with no tenderness over AC joint or bicipital groove. ROM is full in all planes. Rotator cuff strength normal throughout. Positive Neer and Hawkin's tests, empty can but not as severe of pain as he has in AM Normal scapular function observed. No painful arc and no drop arm sign.  Assessment/Plan:  Bilateral shoulder pain S:Both shoulders ache when you wake up in the morning. Going on for 2-3 months, seemed to actually get worse when he stopped cholesterol medicine. Gradually worsening. Hurts some with overhead activities as well. Has to put a hot cloth on it in the morning and helps. Takes aleve but seems to constipate him. Has taken tylenol and constipates him some as well. Thought this was due to atorvastatin once weekly changed to twice weekly- stopped a month ago and it resolved. Sometimes hurts in shoulders to flip a light on. Occasional cramps in legs despite being off medicine as well.  ROS- No neck pain. No  stiffness in the joints. No fever, chills, weight loss. No chest pain or shortness of breath A/P: Suspect some likely baseline arthritis. Also with some impingement signs may have some rotator cuff pathology. He has had suicide attempt on injected steroids with arteritis in past and wants to avoid if possible including oral. We opted for 10 days of mobic (no indomethacin with this). With heat or ice as well as home exercise program. 3 week follow up with me or can call in and refer to orthopedics if he  prefers. Options perhaps 10mg  prednisone for 7 days or lower dose injection Return precautions advised. For constipation advised miralax capful every other day.   Hyperlipidemia S: since pain seemed to worsen when coming off atorvastatin a month ago- reasonable to restart A/P: we will start at 1 pill a week of atorvastatin 40mg  instead of twice a week    Meds ordered this encounter  Medications  . meloxicam (MOBIC) 7.5 MG tablet    Sig: Take 1 tablet (7.5 mg total) by mouth daily. Do not take ibuprofen/aleve/motrin/etc with this. Only tylenol    Dispense:  15 tablet    Refill:  0

## 2015-04-23 NOTE — Patient Instructions (Signed)
I think you may either have some arthritis or some rotator cuff pain.   Use antiinflammatory for 10 days then you will have 5 remaining pills which you can use sparingly.   Use home exercises to try to strengthen shoulder.   I would be happy to see you in about 3 weeks to check in. We could consider steroid (prednisone injection) basically to see if it helps but hesitant to do this with your history. Could also refer to orthopedics and you can let me know by mychart if you would prefer to do this

## 2015-04-23 NOTE — Assessment & Plan Note (Signed)
S: since pain seemed to worsen when coming off atorvastatin a month ago- reasonable to restart A/P: we will start at 1 pill a week of atorvastatin 40mg  instead of twice a week

## 2015-04-24 ENCOUNTER — Other Ambulatory Visit: Payer: Self-pay | Admitting: Family Medicine

## 2015-05-23 ENCOUNTER — Other Ambulatory Visit: Payer: Self-pay | Admitting: Family Medicine

## 2015-06-18 ENCOUNTER — Other Ambulatory Visit: Payer: Self-pay | Admitting: Family Medicine

## 2015-06-19 ENCOUNTER — Other Ambulatory Visit: Payer: Self-pay | Admitting: Family Medicine

## 2015-09-16 DIAGNOSIS — H52203 Unspecified astigmatism, bilateral: Secondary | ICD-10-CM | POA: Diagnosis not present

## 2015-09-16 DIAGNOSIS — H5203 Hypermetropia, bilateral: Secondary | ICD-10-CM | POA: Diagnosis not present

## 2015-09-16 DIAGNOSIS — H2513 Age-related nuclear cataract, bilateral: Secondary | ICD-10-CM | POA: Diagnosis not present

## 2015-09-16 DIAGNOSIS — H04123 Dry eye syndrome of bilateral lacrimal glands: Secondary | ICD-10-CM | POA: Diagnosis not present

## 2015-09-16 DIAGNOSIS — H40019 Open angle with borderline findings, low risk, unspecified eye: Secondary | ICD-10-CM | POA: Diagnosis not present

## 2015-09-19 ENCOUNTER — Other Ambulatory Visit: Payer: Self-pay

## 2015-09-19 ENCOUNTER — Other Ambulatory Visit: Payer: Self-pay | Admitting: Family Medicine

## 2015-09-19 NOTE — Telephone Encounter (Signed)
Yes thanks but should be prn for sleep

## 2015-10-28 ENCOUNTER — Encounter: Payer: Self-pay | Admitting: Family Medicine

## 2015-10-28 ENCOUNTER — Ambulatory Visit (INDEPENDENT_AMBULATORY_CARE_PROVIDER_SITE_OTHER): Payer: Medicare Other | Admitting: Family Medicine

## 2015-10-28 VITALS — BP 120/64 | HR 66 | Temp 98.0°F | Wt 165.6 lb

## 2015-10-28 DIAGNOSIS — Z23 Encounter for immunization: Secondary | ICD-10-CM

## 2015-10-28 DIAGNOSIS — M7052 Other bursitis of knee, left knee: Secondary | ICD-10-CM | POA: Diagnosis not present

## 2015-10-28 NOTE — Progress Notes (Signed)
Subjective:  Kevin Perez is a 70 y.o. year old very pleasant male patient who presents for/with See problem oriented charting ROS- no fever, chills, nausea vomiting. No expanding redness above knee but does have mild redness and warmth on left.see any ROS included in HPI as well.   Past Medical History-  Patient Active Problem List   Diagnosis Date Noted  . Testicular swelling, left 04/30/2014    Priority: Medium  . Gout 01/08/2014    Priority: Medium  . Insomnia 10/29/2013    Priority: Medium  . Hyperlipidemia 06/17/2009    Priority: Medium  . Arteritis ([place]) 05/23/2007    Priority: Medium  . Suicide attempt 01/19/2007    Priority: Medium  . Essential hypertension 07/21/2006    Priority: Medium  . Former smoker 04/30/2014    Priority: Low  . Prepatellar bursitis of right knee 01/22/2014    Priority: Low  . GERD 07/21/2006    Priority: Low  . BPH associated with nocturia 04/30/2014    Medications- reviewed and updated Current Outpatient Prescriptions  Medication Sig Dispense Refill  . allopurinol (ZYLOPRIM) 100 MG tablet TAKE 1 TABLET (100 MG TOTAL) BY MOUTH DAILY. 30 tablet 6  . aspirin 81 MG tablet Take 81 mg by mouth daily.      Marland Kitchen atorvastatin (LIPITOR) 40 MG tablet TAKE 1 TABLET (40 MG TOTAL) BY MOUTH ONCE A WEEK. 15 tablet 3  . calcium carbonate (TUMS - DOSED IN MG ELEMENTAL CALCIUM) 500 MG chewable tablet Chew 1 tablet by mouth as needed for indigestion or heartburn.    . clonazePAM (KLONOPIN) 1 MG tablet TAKE 1/2 TO 1 TABLET BY MOUTH EVERY DAY AS NEEDED 15 tablet 1  . indomethacin (INDOCIN) 25 MG capsule Take 1 capsule (25 mg total) by mouth 3 (three) times daily as needed. 60 capsule 5  . losartan (COZAAR) 100 MG tablet TAKE 1 TABLET BY MOUTH EVERY DAY 30 tablet 5   No current facility-administered medications for this visit.     Objective: BP 120/64 (BP Location: Left Arm, Patient Position: Sitting, Cuff Size: Large)   Pulse 66   Temp 98 F (36.7 C)  (Oral)   Wt 165 lb 9.6 oz (75.1 kg)   SpO2 97%   BMI 23.10 kg/m  Gen: NAD, resting comfortably CV: RRR no murmurs rubs or gallops Lungs: CTAB no crackles, wheeze, rhonchi Ext: no edema on right, on left trace at ankle. 10 cm below tibilal plateau within 0.5 cm bilaterally Skin: warm, dry, particularly warm above left knee   Knee: abNormal to inspection with erythema, swelling above left knee in area of suprapatellar bursa. no obvious bony abnormalities. Palpation normal with no joint line tenderness or patellar tenderness or condyle tenderness. ROM normal in flexion and extension and lower leg rotation. Ligaments with solid consistent endpoints including ACL, PCL, LCL, MCL. Negative Mcmurray's. Good strength in lower extremities   Assessment/Plan:  Suprapatellar bursitis of left knee S:Patient was down on knees bathing dog on Thursday then on Friday noted swelling above left knee with associated pain. Also some swelling near ankle. Pain peaked at about 7/10 but did occasional indomethacin as well as ace wrap and has now started to improve with pain down to 3/10 today. Area felt wam above knee. No other treatments tried A/P: bursitis- gout in etiology? Vs trauma reltaed from being on knees?will treat with indomethacin 25mg  TID for about 5 days. Discussed some increased cardiac risk with nsaids. Prednisone has caused suicidal ideation in past so  avoid. Discussed if not improving can refer to sports medicine or orthopedics to consider aspiration. He has had bursitis in the past prepatellar which recurred after drainage so did not think that was first line for him and as always has potential to recur   Orders Placed This Encounter  Procedures  . Flu vaccine HIGH DOSE PF    Return precautions advised.  [name], MD

## 2015-10-28 NOTE — Patient Instructions (Signed)
Let's do indomethacin for 3-5 more days 3x a day. This looks like bursitis of the suprapatellar bursa which is a different location than prior bursitis. May be flared up due to gout but also can happen randomly.   If not improving or worsens- can return to see [place] or we can get you in with sports medicine or orthopedics.   Ace wraps reasonable as well  Flu shot before you leave

## 2015-10-28 NOTE — Progress Notes (Signed)
Pre visit review using our clinic review tool, if applicable. No additional management support is needed unless otherwise documented below in the visit note. 

## 2015-11-04 DIAGNOSIS — Z79899 Other long term (current) drug therapy: Secondary | ICD-10-CM | POA: Diagnosis not present

## 2015-11-04 DIAGNOSIS — H35711 Central serous chorioretinopathy, right eye: Secondary | ICD-10-CM | POA: Diagnosis not present

## 2015-11-04 DIAGNOSIS — Z87891 Personal history of nicotine dependence: Secondary | ICD-10-CM | POA: Diagnosis not present

## 2015-11-04 DIAGNOSIS — I10 Essential (primary) hypertension: Secondary | ICD-10-CM | POA: Diagnosis not present

## 2015-11-04 DIAGNOSIS — Z7982 Long term (current) use of aspirin: Secondary | ICD-10-CM | POA: Diagnosis not present

## 2015-11-04 DIAGNOSIS — Z885 Allergy status to narcotic agent status: Secondary | ICD-10-CM | POA: Diagnosis not present

## 2015-11-04 DIAGNOSIS — H40003 Preglaucoma, unspecified, bilateral: Secondary | ICD-10-CM | POA: Diagnosis not present

## 2015-11-04 DIAGNOSIS — H2513 Age-related nuclear cataract, bilateral: Secondary | ICD-10-CM | POA: Diagnosis not present

## 2015-11-09 ENCOUNTER — Other Ambulatory Visit: Payer: Self-pay | Admitting: Family Medicine

## 2015-12-14 ENCOUNTER — Other Ambulatory Visit: Payer: Self-pay | Admitting: Family Medicine

## 2015-12-15 ENCOUNTER — Other Ambulatory Visit: Payer: Self-pay | Admitting: Family Medicine

## 2015-12-18 ENCOUNTER — Telehealth: Payer: Self-pay | Admitting: Family Medicine

## 2015-12-18 NOTE — Telephone Encounter (Signed)
Called Kevin Perez to schedule awv appt. Left msg for pt to call office to schedule awv appt.

## 2015-12-31 ENCOUNTER — Ambulatory Visit (INDEPENDENT_AMBULATORY_CARE_PROVIDER_SITE_OTHER): Payer: Medicare Other

## 2015-12-31 VITALS — BP 140/70 | HR 63 | Ht 70.0 in | Wt 161.4 lb

## 2015-12-31 DIAGNOSIS — Z0000 Encounter for general adult medical examination without abnormal findings: Secondary | ICD-10-CM | POA: Diagnosis not present

## 2015-12-31 NOTE — Progress Notes (Signed)
Subjective:   Kevin Perez is a 70 y.o. male who presents for an Initial Medicare Annual Wellness Visit.  The Patient was informed that the wellness visit is to identify future health risk and educate and initiate measures that can reduce risk for increased disease through the lifespan.    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 Good  Stays very busy but does not report a lot of stress   Preventive Screening -Counseling & Management  Colonoscopy 06/2013; repeat 06/2023/ was told he probably will not need another   Current smoking/ tobacco status/ former smoker  1 pack year;  Used chew until 1991 which he states was his main issue Does have regular dental work   ETOH 2 drinks / used to; very little now   RISK FACTORS Regular exercise / still trying to walk but had hammertoe Did have regular walking program and walked 5 days a week  Museum/gallery conservator Will try to start walking again  Diet Wife still cooks Breakfast; heat cereal; fix bacon Lunch; out to lunch; hamburger  Supper; tries to fix whatever his wife can eat   Fall risk; no Mobility of Functional changes this year? no Safety; community, wears sunscreen, safe place for firearms; Motor vehicle accidents; no  Cardiac Risk Factors:  Advanced aged > 31 in men; >65 in women Hyperlipidemia - chol 194;  trig 187; HDL 30 and LDL 127  Diabetes- A1c 5.4  Family History (father had HTN; Prostate cancer and stroke)  Obesity not overweight  Discussed his cholesterol numbers and education provided  Meds; can tolerate one statin but not 2   Long term care and is working with an attorney now for estate planning    Eye exam/ issue with right eye; ? Arteritis ruled out but now released from [facility] following; sees 2 doctors   Depression Screen/ states he is to busy Hx of suicide attempt on Prednisone PhQ 2: negative  Activities of Daily Living - See functional screen   Cognitive testing; remembering names Ad8 score reviewed for issues:  Issues making decisions:  Less interest in hobbies / activities:  Repeats questions, stories (family complaining):  Trouble using ordinary gadgets (microwave, computer, phone):  Forgets the month or year:   Mismanaging finances:   Remembering appts:  Daily problems with thinking and/or memory: Ad8 score is= 0     Advanced Directives has completed   Patient Care Team: [name], MD as PCP - General (Family Medicine) Kennyth Arnold, FNP as Nurse Practitioner (Family Medicine)   Immunization History  Administered Date(s) Administered  . Influenza Split 01/04/2011, 12/04/2011  . Influenza Whole 10/31/2009  . Influenza, High Dose Seasonal PF 10/28/2015  . Influenza-Unspecified 12/02/2013, 11/02/2014  . Pneumococcal Conjugate-13 04/30/2014  . Pneumococcal Polysaccharide-23 07/31/2010  .  Td 06/17/2009   Required Immunizations needed today  Screening test up to date or reviewed for plan of completion Health Maintenance Due  Topic Date Due  . ZOSTAVAX  05/23/2005    Educated to check with insurance regarding coverage of Shingles vaccination on Part D or Part B and may have lower co-pay if provided on the Part D side  Had chickenpox;  Educated to check with insurance regarding coverage of Shingles vaccination on Part D  or Part B and may have lower co-pay if provided on the Part D side   Cardiac Risk Factors include: advanced age (>76men, >72 women);dyslipidemia;hypertension;male gender    Objective:    Today's Vitals   12/31/15 1006  BP: 140/70  Pulse: 63  SpO2: 98%  Weight: 161 lb 6 oz (73.2 kg)  Height: 5\' 10"  (1.778 m)   Body mass index is 23.15 kg/m.  Current Medications (verified) Outpatient Encounter Prescriptions as of 12/31/2015  Medication Sig  . allopurinol (ZYLOPRIM) 100 MG tablet TAKE 1 TABLET (100 MG TOTAL) BY MOUTH DAILY.  Marland Kitchen aspirin 81 MG tablet Take 81 mg by mouth daily.    Marland Kitchen atorvastatin (LIPITOR) 40 MG tablet TAKE 1 TABLET (40 MG TOTAL) BY MOUTH ONCE A WEEK.  . calcium carbonate (TUMS - DOSED IN MG ELEMENTAL CALCIUM) 500 MG chewable tablet Chew 1 tablet by mouth as needed for indigestion or heartburn.  . clonazePAM (KLONOPIN) 1 MG tablet TAKE 1/2 TO 1 TABLET BY MOUTH EVERY DAY AS NEEDED  . indomethacin (INDOCIN) 25 MG capsule TAKE ONE CAPSULE BY MOUTH 3 TIMES A DAY AS NEEDED  . losartan (COZAAR) 100 MG tablet TAKE 1 TABLET BY MOUTH EVERY DAY   No facility-administered encounter medications on file as of 12/31/2015.     Allergies (verified) Prednisone; Amoxicillin-pot clavulanate; Ace inhibitors; and Codeine phosphate   History: Past Medical History:  Diagnosis Date  . GERD 07/21/2006  . Gout   . HYPERLIPIDEMIA 06/17/2009  . HYPERTENSION 07/21/2006  . Raynaud's syndrome 07/21/2006  . Suicide attempt    Due to Prednisone!  Marland Kitchen VASCULITIS 05/23/2007   Past Surgical History:  Procedure Laterality Date  . HERNIA REPAIR  [id number]   umbilical hernia  . INGUINAL HERNIA REPAIR     Bil   Family History  Problem Relation Age of Onset  . Hypertension Father   . Prostate cancer Father 74    never operated, not cause of death  . Stroke Father    Social History   Occupational History  . Not on file.   Social History Main Topics  . Smoking status: Former Smoker    Packs/day:  0.05    Years: 20.00    Types: Cigarettes  . Smokeless tobacco: Former Systems developer    Types: [place] date: 06/26/1989     Comment: Smoked occasionally  . Alcohol use 1.2 oz/week    2 Standard drinks or equivalent per week  . Drug use: No  . Sexual activity: Not on file   Tobacco Counseling Counseling given: Not Answered   Activities of Daily Living In your present state of health, do you have any difficulty performing the following activities: 12/31/2015  Hearing? N  Vision? Y  Difficulty concentrating or making decisions? N  Walking or climbing stairs? N  Dressing or bathing? N  Doing errands, shopping? N  Preparing Food and eating ? N  Using the Toilet? N  In the past six months, have you accidently leaked urine? N  Do  you have problems with loss of bowel control? N  Managing your Medications? N  Managing your Finances? N  Housekeeping or managing your Housekeeping? N  Some recent data might be hidden    Immunizations and Health Maintenance Immunization History  Administered Date(s) Administered  . Influenza Split 01/04/2011, 12/04/2011  . Influenza Whole 10/31/2009  . Influenza, High Dose Seasonal PF 10/28/2015  . Influenza-Unspecified 12/02/2013, 11/02/2014  . Pneumococcal Conjugate-13 04/30/2014  . Pneumococcal Polysaccharide-23 07/31/2010  . Td 06/17/2009   Health Maintenance Due  Topic Date Due  . ZOSTAVAX  05/23/2005    Patient Care Team: [name], MD as PCP - General (Family Medicine) Kennyth Arnold, FNP as Nurse Practitioner (Family Medicine)  Indicate any recent Medical Services you may have received from other than [facility] providers in the past year (date may be approximate).    Assessment:   This is a routine wellness examination for Jaidon.   Hearing/Vision screen  Hearing Screening   125Hz  250Hz  500Hz  1000Hz  2000Hz  3000Hz  4000Hz  6000Hz  8000Hz   Right ear:     100      Left ear:     100      Vision Screening Comments: Deferred to [facility]  [place]   Dietary issues and exercise activities discussed: Current Exercise Habits: Home exercise routine, Type of exercise: walking, Time (Minutes): 30, Frequency (Times/Week): 3, Weekly Exercise (Minutes/Week): 90, Intensity: Moderate  Goals    . Exercise 150 minutes per week (moderate activity)          Will try to get time to get back on your walking schedule       Depression Screen PHQ 2/9 Scores 12/31/2015 10/28/2015 04/30/2014 02/05/2013  PHQ - 2 Score 0 0 0 0    Fall Risk Fall Risk  12/31/2015 10/28/2015 09/19/2015 04/30/2014 02/05/2013  Falls in the past year? No No No No No    Cognitive Function: MMSE - Mini Mental State Exam 12/31/2015  Not completed: (No Data)        Screening Tests Health Maintenance  Topic Date Due  . ZOSTAVAX  05/23/2005  . TETANUS/TDAP  06/18/2019  . COLONOSCOPY  07/11/2023  . INFLUENZA VACCINE  Addressed  . Hepatitis C Screening  Completed  . PNA vac Low Risk Adult  Completed        Plan:    PCP Notes  Health Maintenance; needs shingles vaccine but will wait until 2018 vaccine comes out / educated on coverage and referred to insurance carrier  Abnormal Screens/ none    Patient concerns;  Nurse Concerns; care giving spouse and mother but denies stress;  Very talkative   Next PCP apt - Just had apt 10/28/15   During the course of the visit Srinivas was educated and counseled about the following appropriate screening and preventive services:   Vaccines to include Pneumoccal, Influenza, Hepatitis B, Td, Zostavax, HCV  Electrocardiogram  Colorectal cancer screening  Cardiovascular disease screening  Diabetes screening  Glaucoma screening  Nutrition counseling  Prostate cancer screening  Smoking cessation counseling  Patient Instructions (the written plan) were given to the patient.   Wynetta Fines, RN   12/31/2015

## 2015-12-31 NOTE — Progress Notes (Signed)
I have reviewed and agree with note, evaluation, plan.   Kanda Deluna, MD  

## 2015-12-31 NOTE — Patient Instructions (Addendum)
Kevin Perez , Thank you for taking time to come for your Medicare Wellness Visit. I appreciate your ongoing commitment to your health goals. Please review the following plan we discussed and let me know if I can assist you in the future.   Will consider taking shingles vaccine; stay tuned for 2018 new vaccine coming out   These are the goals we discussed: Goals    . Exercise 150 minutes per week (moderate activity)          Will try to get time to get back on your walking schedule        This is a list of the screening recommended for you and due dates:  Health Maintenance  Topic Date Due  . Shingles Vaccine  05/23/2005  . Tetanus Vaccine  06/18/2019  . Colon Cancer Screening  07/11/2023  . Flu Shot  Addressed  .  Hepatitis C: One time screening is recommended by Center for Disease Control  (CDC) for  adults born from [birth year] through 1965.   Completed  . Pneumonia vaccines  Completed   '    Fall Prevention in the Home Introduction Falls can cause injuries. They can happen to people of all ages. There are many things you can do to make your home safe and to help prevent falls. What can I do on the outside of my home?  Regularly fix the edges of walkways and driveways and fix any cracks.  Remove anything that might make you trip as you walk through a door, such as a raised step or threshold.  Trim any bushes or trees on the path to your home.  Use bright outdoor lighting.  Clear any walking paths of anything that might make someone trip, such as rocks or tools.  Regularly check to see if handrails are loose or broken. Make sure that both sides of any steps have handrails.  Any raised decks and porches should have guardrails on the edges.  Have any leaves, snow, or ice cleared regularly.  Use sand or salt on walking paths during winter.  Clean up any spills in your garage right away. This includes oil or grease spills. What can I do in the bathroom?  Use night  lights.  Install grab bars by the toilet and in the tub and shower. Do not use towel bars as grab bars.  Use non-skid mats or decals in the tub or shower.  If you need to sit down in the shower, use a plastic, non-slip stool.  Keep the floor dry. Clean up any water that spills on the floor as soon as it happens.  Remove soap buildup in the tub or shower regularly.  Attach bath mats securely with double-sided non-slip rug tape.  Do not have throw rugs and other things on the floor that can make you trip. What can I do in the bedroom?  Use night lights.  Make sure that you have a light by your bed that is easy to reach.  Do not use any sheets or blankets that are too big for your bed. They should not hang down onto the floor.  Have a firm chair that has side arms. You can use this for support while you get dressed.  Do not have throw rugs and other things on the floor that can make you trip. What can I do in the kitchen?  Clean up any spills right away.  Avoid walking on wet floors.  Keep items that you use a  lot in easy-to-reach places.  If you need to reach something above you, use a strong step stool that has a grab bar.  Keep electrical cords out of the way.  Do not use floor polish or wax that makes floors slippery. If you must use wax, use non-skid floor wax.  Do not have throw rugs and other things on the floor that can make you trip. What can I do with my stairs?  Do not leave any items on the stairs.  Make sure that there are handrails on both sides of the stairs and use them. Fix handrails that are broken or loose. Make sure that handrails are as long as the stairways.  Check any carpeting to make sure that it is firmly attached to the stairs. Fix any carpet that is loose or worn.  Avoid having throw rugs at the top or bottom of the stairs. If you do have throw rugs, attach them to the floor with carpet tape.  Make sure that you have a light switch at the  top of the stairs and the bottom of the stairs. If you do not have them, ask someone to add them for you. What else can I do to help prevent falls?  Wear shoes that:  Do not have high heels.  Have rubber bottoms.  Are comfortable and fit you well.  Are closed at the toe. Do not wear sandals.  If you use a stepladder:  Make sure that it is fully opened. Do not climb a closed stepladder.  Make sure that both sides of the stepladder are locked into place.  Ask someone to hold it for you, if possible.  Clearly mark and make sure that you can see:  Any grab bars or handrails.  First and last steps.  Where the edge of each step is.  Use tools that help you move around (mobility aids) if they are needed. These include:  Canes.  Walkers.  Scooters.  Crutches.  Turn on the lights when you go into a dark area. Replace any light bulbs as soon as they burn out.  Set up your furniture so you have a clear path. Avoid moving your furniture around.  If any of your floors are uneven, fix them.  If there are any pets around you, be aware of where they are.  Review your medicines with your doctor. Some medicines can make you feel dizzy. This can increase your chance of falling. Ask your doctor what other things that you can do to help prevent falls. This information is not intended to replace advice given to you by your health care provider. Make sure you discuss any questions you have with your health care provider. Document Released: 10/31/2008 Document Revised: 06/12/2015 Document Reviewed: 02/08/2014  2017 Elsevier  Health Maintenance, Male A healthy lifestyle and preventative care can promote health and wellness.  Maintain regular health, dental, and eye exams.  Eat a healthy diet. Foods like vegetables, fruits, whole grains, low-fat dairy products, and lean protein foods contain the nutrients you need and are low in calories. Decrease your intake of foods high in solid  fats, added sugars, and salt. Get information about a proper diet from your health care provider, if necessary.  Regular physical exercise is one of the most important things you can do for your health. Most adults should get at least 150 minutes of moderate-intensity exercise (any activity that increases your heart rate and causes you to sweat) each week. In addition, most  adults need muscle-strengthening exercises on 2 or more days a week.   Maintain a healthy weight. The body mass index (BMI) is a screening tool to identify possible weight problems. It provides an estimate of body fat based on height and weight. Your health care provider can find your BMI and can help you achieve or maintain a healthy weight. For males 20 years and older:  A BMI below 18.5 is considered underweight.  A BMI of 18.5 to 24.9 is normal.  A BMI of 25 to 29.9 is considered overweight.  A BMI of 30 and above is considered obese.  Maintain normal blood lipids and cholesterol by exercising and minimizing your intake of saturated fat. Eat a balanced diet with plenty of fruits and vegetables. Blood tests for lipids and cholesterol should begin at age 55 and be repeated every 5 years. If your lipid or cholesterol levels are high, you are over age 76, or you are at high risk for heart disease, you may need your cholesterol levels checked more frequently.Ongoing high lipid and cholesterol levels should be treated with medicines if diet and exercise are not working.  If you smoke, find out from your health care provider how to quit. If you do not use tobacco, do not start.  Lung cancer screening is recommended for adults aged 84-80 years who are at high risk for developing lung cancer because of a history of smoking. A yearly low-dose CT scan of the lungs is recommended for people who have at least a 30-pack-year history of smoking and are current smokers or have quit within the past 15 years. A pack year of smoking is  smoking an average of 1 pack of cigarettes a day for 1 year (for example, a 30-pack-year history of smoking could mean smoking 1 pack a day for 30 years or 2 packs a day for 15 years). Yearly screening should continue until the smoker has stopped smoking for at least 15 years. Yearly screening should be stopped for people who develop a health problem that would prevent them from having lung cancer treatment.  If you choose to drink alcohol, do not have more than 2 drinks per day. One drink is considered to be 12 oz (360 mL) of beer, 5 oz (150 mL) of wine, or 1.5 oz (45 mL) of liquor.  Avoid the use of street drugs. Do not share needles with anyone. Ask for help if you need support or instructions about stopping the use of drugs.  High blood pressure causes heart disease and increases the risk of stroke. High blood pressure is more likely to develop in:  People who have blood pressure in the end of the normal range (100-139/85-89 mm Hg).  People who are overweight or obese.  People who are African American.  If you are 69-37 years of age, have your blood pressure checked every 3-5 years. If you are 21 years of age or older, have your blood pressure checked every year. You should have your blood pressure measured twice-once when you are at a hospital or clinic, and once when you are not at a hospital or clinic. Record the average of the two measurements. To check your blood pressure when you are not at a hospital or clinic, you can use:  An automated blood pressure machine at a pharmacy.  A home blood pressure monitor.  If you are 26-19 years old, ask your health care provider if you should take aspirin to prevent heart disease.  Diabetes screening  involves taking a blood sample to check your fasting blood sugar level. This should be done once every 3 years after age 62 if you are at a normal weight and without risk factors for diabetes. Testing should be considered at a younger age or be carried  out more frequently if you are overweight and have at least 1 risk factor for diabetes.  Colorectal cancer can be detected and often prevented. Most routine colorectal cancer screening begins at the age of 68 and continues through age 47. However, your health care provider may recommend screening at an earlier age if you have risk factors for colon cancer. On a yearly basis, your health care provider may provide home test kits to check for hidden blood in the stool. A small camera at the end of a tube may be used to directly examine the colon (sigmoidoscopy or colonoscopy) to detect the earliest forms of colorectal cancer. Talk to your health care provider about this at age 23 when routine screening begins. A direct exam of the colon should be repeated every 5-10 years through age 5, unless early forms of precancerous polyps or small growths are found.  People who are at an increased risk for hepatitis B should be screened for this virus. You are considered at high risk for hepatitis B if:  You were born in a country where hepatitis B occurs often. Talk with your health care provider about which countries are considered high risk.  Your parents were born in a high-risk country and you have not received a shot to protect against hepatitis B (hepatitis B vaccine).  You have HIV or AIDS.  You use needles to inject street drugs.  You live with, or have sex with, someone who has hepatitis B.  You are a man who has sex with other men (MSM).  You get hemodialysis treatment.  You take certain medicines for conditions like cancer, organ transplantation, and autoimmune conditions.  Hepatitis C blood testing is recommended for all people born from [birth year] through 1965 and any individual with known risk factors for hepatitis C.  Healthy men should no longer receive prostate-specific antigen (PSA) blood tests as part of routine cancer screening. Talk to your health care provider about prostate cancer  screening.  Testicular cancer screening is not recommended for adolescents or adult males who have no symptoms. Screening includes self-exam, a health care provider exam, and other screening tests. Consult with your health care provider about any symptoms you have or any concerns you have about testicular cancer.  Practice safe sex. Use condoms and avoid high-risk sexual practices to reduce the spread of sexually transmitted infections (STIs).  You should be screened for STIs, including gonorrhea and chlamydia if:  You are sexually active and are younger than 24 years.  You are older than 24 years, and your health care provider tells you that you are at risk for this type of infection.  Your sexual activity has changed since you were last screened, and you are at an increased risk for chlamydia or gonorrhea. Ask your health care provider if you are at risk.  If you are at risk of being infected with HIV, it is recommended that you take a prescription medicine daily to prevent HIV infection. This is called pre-exposure prophylaxis (PrEP). You are considered at risk if:  You are a man who has sex with other men (MSM).  You are a heterosexual man who is sexually active with multiple partners.  You take drugs by  injection.  You are sexually active with a partner who has HIV.  Talk with your health care provider about whether you are at high risk of being infected with HIV. If you choose to begin PrEP, you should first be tested for HIV. You should then be tested every 3 months for as long as you are taking PrEP.  Use sunscreen. Apply sunscreen liberally and repeatedly throughout the day. You should seek shade when your shadow is shorter than you. Protect yourself by wearing long sleeves, pants, a wide-brimmed hat, and sunglasses year round whenever you are outdoors.  Tell your health care provider of new moles or changes in moles, especially if there is a change in shape or color. Also, tell  your health care provider if a mole is larger than the size of a pencil eraser.  A one-time screening for abdominal aortic aneurysm (AAA) and surgical repair of large AAAs by ultrasound is recommended for men aged 46-75 years who are current or former smokers.  Stay current with your vaccines (immunizations). This information is not intended to replace advice given to you by your health care provider. Make sure you discuss any questions you have with your health care provider. Document Released: 07/03/2007 Document Revised: 01/25/2014 Document Reviewed: 10/08/2014 Elsevier Interactive Patient Education  2017 [employer].

## 2016-01-28 ENCOUNTER — Telehealth: Payer: Self-pay | Admitting: Family Medicine

## 2016-01-28 NOTE — Telephone Encounter (Signed)
Pt would like new rx losartan 100 mg #90 to save money.[facility]

## 2016-01-29 ENCOUNTER — Other Ambulatory Visit: Payer: Self-pay

## 2016-01-29 MED ORDER — LOSARTAN POTASSIUM 100 MG PO TABS: 100.0000 mg | ORAL_TABLET | Freq: Every day | ORAL | 2 refills | Status: DC

## 2016-01-29 NOTE — Telephone Encounter (Signed)
Prescription sent in as requested 

## 2016-02-11 ENCOUNTER — Other Ambulatory Visit: Payer: Self-pay | Admitting: Family Medicine

## 2016-02-19 ENCOUNTER — Telehealth: Payer: Self-pay | Admitting: Family Medicine

## 2016-02-19 NOTE — Telephone Encounter (Signed)
Spoke with patient who states that his wife got sick and was given 2 IV bags of fluid. His wife was throwing up. She started a clinical trial recently for cancer treatment. Not sure if she is sick from this or the flu. She has not been diagnosed. He is not showing any signs or symptoms of the flu.

## 2016-02-19 NOTE — Telephone Encounter (Signed)
Pt wife is cancer patient and given tami-flu yesterday and jim would like tamiflu also. Pt does not have any symptoms Kevin  Perez

## 2016-02-19 NOTE — Telephone Encounter (Signed)
Given diagnosis is not clear- let's not treat him unless becomes symptomatic with fever, body aches, chills, fatigue

## 2016-02-20 NOTE — Telephone Encounter (Signed)
Spoke to patient who verbalized understanding.

## 2016-05-10 ENCOUNTER — Other Ambulatory Visit: Payer: Self-pay | Admitting: Family Medicine

## 2016-08-09 ENCOUNTER — Other Ambulatory Visit: Payer: Self-pay | Admitting: Family Medicine

## 2016-09-22 ENCOUNTER — Other Ambulatory Visit: Payer: Self-pay | Admitting: Family Medicine

## 2016-11-01 DIAGNOSIS — Z23 Encounter for immunization: Secondary | ICD-10-CM | POA: Diagnosis not present

## 2016-12-15 ENCOUNTER — Other Ambulatory Visit: Payer: Self-pay

## 2016-12-15 MED ORDER — ATORVASTATIN CALCIUM 40 MG PO TABS: ORAL_TABLET | ORAL | 0 refills | Status: DC

## 2016-12-15 MED ORDER — LOSARTAN POTASSIUM 100 MG PO TABS: 100.0000 mg | ORAL_TABLET | Freq: Every day | ORAL | 0 refills | Status: DC

## 2017-02-01 ENCOUNTER — Telehealth: Payer: Self-pay | Admitting: Family Medicine

## 2017-02-01 NOTE — Telephone Encounter (Signed)
MEDICATION: atorvastatin (LIPITOR) 40 MG tablet  PHARMACY:  [id number] - [place], [place] - [address] [phone] (Phone) [phone] (Fax)     IS THIS A 90 DAY SUPPLY :   IS PATIENT OUT OF MEDICATION:   IF NOT; HOW MUCH IS LEFT:   LAST APPOINTMENT DATE: @Visit  date not found  NEXT APPOINTMENT DATE:@1 /93/7342  OTHER COMMENTS: patient needs new rx for 2 tablets a week.    **Let patient know to contact pharmacy at the end of the day to make sure medication is ready. **  ** Please notify patient to allow 48-72 hours to process**  **Encourage patient to contact the pharmacy for refills or they can request refills through [facility]**

## 2017-02-01 NOTE — Telephone Encounter (Signed)
Patient needs new rx for 2 tablets a week.

## 2017-02-02 NOTE — Progress Notes (Signed)
Subjective:   Kevin Perez is a 72 y.o. male who presents for Medicare Annual/Subsequent preventive examination.  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  She got to go to the beach  Still volunteering for the fire dept  Can incorporate exercise if she can get someone to stay with her  Walks in the summer time   Former smoker but limited; mostly chewed tobacco Had Abd Aortic Aneurysm checked [place]; 2015; It was ok and he will check the record to confirm   There are no preventive care reminders to display for this patient.  Colonoscopy 06/2013   Educated regarding the shingrix     Objective:    Vitals: BP 132/70   Pulse 64   Ht 5\' 7"  (1.702 m)   Wt 151 lb (68.5 kg)   SpO2 96%   BMI 23.65 kg/m   Body mass index is 23.65 kg/m.  Advanced Directives 02/03/2017 12/31/2015 02/03/2014 01/10/2014  Does Patient Have a Medical Advance Directive? Yes Yes No No  Type of Advance Directive - Healthcare Power of [place];Living will - -  Would patient like information on creating a medical advance directive? - - No - patient declined information No - patient declined information    Tobacco Social History   Tobacco Use  Smoking Status Former Smoker  . Packs/day: 0.05  . Years: 20.00  . Pack years: 1.00  . Types: Cigarettes  Smokeless Tobacco Former Systems developer  . Types: Chew  . Quit date: 06/26/1989    Tobacco Comment   chewed tobacco until 89 more than he smoked      Counseling given: Yes Comment: chewed tobacco until 89 more than he smoked    Clinical Intake:     Past Medical History:  Diagnosis Date  . GERD 07/21/2006  . Gout   . HYPERLIPIDEMIA 06/17/2009  . HYPERTENSION 07/21/2006  . Raynaud's syndrome 07/21/2006  . Suicide attempt [facility])    Due to Prednisone!  Marland Kitchen VASCULITIS 05/23/2007   Past Surgical History:  Procedure Laterality Date  . HERNIA REPAIR  09/20/21   umbilical hernia  . INGUINAL HERNIA REPAIR     Bil   Family History  Problem Relation Age of Onset  . Hypertension Father   . Prostate cancer Father 42       never operated, not cause of death  . Stroke Father    Social History   Socioeconomic History  . Marital status: Married    Spouse name: Not on file  . Number of children: Not on file  . Years of education: Not on file  . Highest education level: Not on file  Social Needs  . Financial resource strain: Not on file  . Food insecurity - worry: Not on file  . Food  insecurity - inability: Not on file  . Transportation needs - medical: Not on file  . Transportation needs - non-medical: Not on file  Occupational History  . Not on file  Tobacco Use  . Smoking status: Former Smoker    Packs/day: 0.05    Years: 20.00    Pack years: 1.00    Types: Cigarettes  . Smokeless tobacco: Former Systems developer    Types: [place] date: 06/26/1989  . Tobacco comment: chewed tobacco until 89 more than he smoked   Substance and Sexual Activity  . Alcohol use: Yes    Alcohol/week: 1.2 oz    Types: 2 Standard drinks or equivalent per week    Comment: with meals on occasion  . Drug use: No  . Sexual activity: Not on file  Other Topics Concern  . Not on file  Social History Narrative   Married 1967. 2 kids. 1 grandchild. 1 greatgrandchilden.       Retired 2003 from [employer]      Hobbies: fishing, shagging, exercise, cruise, 64 GTO convertible     Outpatient Encounter Medications as of 02/03/2017  Medication Sig  . allopurinol (ZYLOPRIM) 100 MG tablet TAKE 1 TABLET (100 MG TOTAL) BY MOUTH DAILY.  Marland Kitchen aspirin 81 MG tablet Take 81 mg by mouth daily.    Marland Kitchen atorvastatin (LIPITOR) 40 MG tablet TAKE 1 TABLET (40 MG TOTAL) BY MOUTH ONCE A WEEK.  . calcium carbonate (TUMS - DOSED IN MG ELEMENTAL CALCIUM) 500 MG chewable tablet Chew 1 tablet by mouth as needed for indigestion or heartburn.  . clonazePAM (KLONOPIN) 1 MG tablet TAKE 1/2 TO 1 TABLET BY MOUTH EVERY DAY AS NEEDED  . indomethacin (INDOCIN) 25 MG capsule TAKE ONE CAPSULE BY MOUTH 3 TIMES A DAY AS NEEDED  . losartan (COZAAR) 100 MG tablet Take 1 tablet (100 mg total) by mouth daily.   No facility-administered encounter medications on file as of 02/03/2017.     Activities of Daily Living No flowsheet data found.  Patient Care Team: [name], MD as PCP - General (Family Medicine) Kennyth Arnold, FNP as Nurse Practitioner (Family Medicine)   Assessment:   This is a routine wellness examination for Kevin Perez.  Exercise Activities and Dietary recommendations    Goals    . Gain weight     To maintain your weight  Incorporate more foods or snacks as shakes or peanut butter etc       Fall Risk Fall Risk  02/03/2017 12/31/2015 10/28/2015 09/19/2015 04/30/2014  Falls in the past year? No No No No No  Comment - - - Emmi Telephone Survey: data to providers prior to load -    Depression Screen PHQ 2/9 Scores 02/03/2017 12/31/2015 10/28/2015 04/30/2014  PHQ - 2 Score 0 0 0 0    Cognitive Function MMSE - Mini Mental State Exam 12/31/2015  Not completed: (No Data)     Ad8 score reviewed for issues:  Issues making decisions:  Less interest in hobbies / activities:  Repeats questions, stories (family complaining):  Trouble using ordinary gadgets (microwave, computer, phone):  Forgets the month or year:   Mismanaging finances:   Remembering appts:  Daily problems  with thinking and/or memory: Ad8 score is=0        Immunization History  Administered Date(s) Administered  . Influenza Split 01/04/2011, 12/04/2011  . Influenza Whole 10/31/2009  . Influenza, High Dose Seasonal PF 10/28/2015  . Influenza-Unspecified 12/02/2013, 11/02/2014, 11/01/2016  . Pneumococcal  Conjugate-13 04/30/2014  . Pneumococcal Polysaccharide-23 07/31/2010  . Td 06/17/2009     Screening Tests Health Maintenance  Topic Date Due  . TETANUS/TDAP  06/18/2019  . COLONOSCOPY  07/11/2023  . INFLUENZA VACCINE  Completed  . Hepatitis C Screening  Completed  . PNA vac Low Risk Adult  Completed       Plan:      PCP Notes   Health Maintenance Smoking hx; mostly chewed tobacco States he had AAA screening [facility] and get the date for the records  Will plan to take the shingrix at [facility]  Educated regarding the shingrix   Abnormal Screens  BMI 23 buy has lost 10 lbs in the last year  Encouraged to eat snacks, shakes and not skip meals. Also may consider arranging for friends of spouse to assist with care-giving on a scheduled basis.   Hearing 2000 both ears; coming in to see Dr. Yong Channel next week but did give him resources for a hearing screen  Referrals  none  Patient concerns; Up at hs to void x 3; will discuss at his visit  Plans to discuss ongoing ASA 81 mg at his next [place] be refilled but never refilled the original Rx and still has 3 left  Needs Lipitor re-ordered based on recommendations from current labs. Ran out due to being told to take 2 instead of one for one week but does have refill for one as currently ordered today   Nurse Concerns; Ongoing care giving stress   Next PCP apt Jan 24;       I have personally reviewed and noted the following in the patient's chart:   . Medical and social history . Use of alcohol, tobacco or illicit drugs  . Current medications and supplements . Functional ability and  status . Nutritional status . Physical activity . Advanced directives . List of other physicians . Hospitalizations, surgeries, and ER visits in previous 12 months . Vitals . Screenings to include cognitive, depression, and falls . Referrals and appointments  In addition, I have reviewed and discussed with patient certain preventive protocols, quality metrics, and best practice recommendations. A written personalized care plan for preventive services as well as general preventive health recommendations were provided to patient.     Wynetta Fines, RN  02/03/2017

## 2017-02-03 ENCOUNTER — Encounter: Payer: Self-pay | Admitting: *Deleted

## 2017-02-03 ENCOUNTER — Other Ambulatory Visit: Payer: Self-pay

## 2017-02-03 ENCOUNTER — Ambulatory Visit (INDEPENDENT_AMBULATORY_CARE_PROVIDER_SITE_OTHER): Payer: Medicare Other | Admitting: *Deleted

## 2017-02-03 ENCOUNTER — Other Ambulatory Visit: Payer: Medicare Other

## 2017-02-03 VITALS — BP 132/70 | HR 64 | Ht 67.0 in | Wt 151.0 lb

## 2017-02-03 DIAGNOSIS — Z0000 Encounter for general adult medical examination without abnormal findings: Secondary | ICD-10-CM | POA: Diagnosis not present

## 2017-02-03 DIAGNOSIS — E785 Hyperlipidemia, unspecified: Secondary | ICD-10-CM

## 2017-02-03 DIAGNOSIS — I10 Essential (primary) hypertension: Secondary | ICD-10-CM

## 2017-02-03 DIAGNOSIS — M1A0790 Idiopathic chronic gout, unspecified ankle and foot, without tophus (tophi): Secondary | ICD-10-CM

## 2017-02-03 LAB — COMPREHENSIVE METABOLIC PANEL
ALBUMIN: 4.5 g/dL (ref 3.5–5.2)
ALT: 12 U/L (ref 0–53)
AST: 19 U/L (ref 0–37)
Alkaline Phosphatase: 85 U/L (ref 39–117)
BUN: 18 mg/dL (ref 6–23)
CHLORIDE: 100 meq/L (ref 96–112)
CO2: 31 mEq/L (ref 19–32)
Calcium: 10 mg/dL (ref 8.4–10.5)
Creatinine, Ser: 1.28 mg/dL (ref 0.40–1.50)
GFR: 58.77 mL/min — AB (ref >60.00)
GLUCOSE: 91 mg/dL (ref 70–99)
Potassium: 4.5 mEq/L (ref 3.5–5.1)
Sodium: 138 mEq/L (ref 135–145)
Total Bilirubin: 0.9 mg/dL (ref 0.2–1.2)
Total Protein: 7.8 g/dL (ref 6.0–8.3)

## 2017-02-03 LAB — POC URINALSYSI DIPSTICK (AUTOMATED)
Bilirubin, UA: NEGATIVE
Blood, UA: NEGATIVE
Glucose, UA: NEGATIVE
KETONES UA: NEGATIVE
Leukocytes, UA: NEGATIVE
Nitrite, UA: NEGATIVE
Protein, UA: NEGATIVE
SPEC GRAV UA: 1.02 (ref 1.010–1.025)
Urobilinogen, UA: 0.2 E.U./dL
pH, UA: 6 (ref 5.0–8.0)

## 2017-02-03 LAB — LIPID PANEL
CHOLESTEROL: 135 mg/dL (ref 0–200)
HDL: 34.4 mg/dL — AB (ref >39.00)
LDL CALC: 84 mg/dL (ref 0–99)
NonHDL: 100.58
Total CHOL/HDL Ratio: 4
Triglycerides: 85 mg/dL (ref 0.0–149.0)
VLDL: 17 mg/dL (ref 0.0–40.0)

## 2017-02-03 LAB — CBC
HEMATOCRIT: 47.4 % (ref 39.0–52.0)
Hemoglobin: 15.6 g/dL (ref 13.0–17.0)
MCHC: 33 g/dL (ref 30.0–36.0)
MCV: 90.6 fl (ref 78.0–100.0)
Platelets: 200 10*3/uL (ref 150.0–400.0)
RBC: 5.23 Mil/uL (ref 4.22–5.81)
RDW: 13.7 % (ref 11.5–15.5)
WBC: 7.3 10*3/uL (ref 4.0–10.5)

## 2017-02-03 MED ORDER — ATORVASTATIN CALCIUM 40 MG PO TABS: ORAL_TABLET | ORAL | 0 refills | Status: DC

## 2017-02-03 NOTE — Telephone Encounter (Signed)
Prescription sent to pharmacy as requested.

## 2017-02-03 NOTE — Progress Notes (Signed)
I have reviewed and agree with note, evaluation, plan. I ordered labs for today- look forward to seeing patient next week. Will review Susans comments with patient at that time.   [name], MD

## 2017-02-03 NOTE — Patient Instructions (Addendum)
Kevin Perez , Thank you for taking time to come for your Medicare Wellness Visit. I appreciate your ongoing commitment to your health goals. Please review the following plan we discussed and let me know if I can assist you in the future.   Try to take some time off. Perhaps her friends an come by and sit with her on a regular schedule.  Shingrix is a vaccine for the prevention of Shingles in Adults 50 and older.  If you are on Medicare, you can request a prescription from your doctor to be filled at a pharmacy.  Please check with your benefits regarding applicable copays or out of pocket expenses.  The Shingrix is given in 2 vaccines approx 8 weeks apart. You must receive the 2nd dose prior to 6 months from receipt of the first.    De[facility] Hard of Hearing [facility] Services - can assist with hearing aid x 1  No reviews  [place]  [phone]  [phone]  [email or web address]-[email or web address]/[email or web address]_How_to_Buy_Hearing_Aids.pdf   These are the goals we discussed: Goals    . Gain weight     To maintain your weight  Incorporate more foods or snacks as shakes or peanut butter etc       This is a list of the screening recommended for you and due dates:  Health Maintenance  Topic Date Due  . Flu Shot  08/18/2016  . Tetanus Vaccine  06/18/2019  . Colon Cancer Screening  07/11/2023  .  Hepatitis C: One time screening is recommended by Center for Disease Control  (CDC) for  adults born from [birth year] through 1965.   Completed  . Pneumonia vaccines  Completed      Fall Prevention in the Home Falls can cause injuries. They can happen to people of all ages. There are many things you can do to make your home safe and to help prevent falls. What can I do on the outside of my home?  Regularly fix the edges of walkways and driveways and fix any cracks.  Remove anything that might make you trip as you walk through a door, such  as a raised step or threshold.  Trim any bushes or trees on the path to your home.  Use bright outdoor lighting.  Clear any walking paths of anything that might make someone trip, such as rocks or tools.  Regularly check to see if handrails are loose or broken. Make sure that both sides of any steps have handrails.  Any raised decks and porches should have guardrails on the edges.  Have any leaves, snow, or ice cleared regularly.  Use sand or salt on walking paths during winter.  Clean up any spills in your garage right away. This includes oil or grease spills. What can I do in the bathroom?  Use night lights.  Install grab bars by the toilet and in the tub and shower. Do not use towel bars as grab bars.  Use non-skid mats or decals in the tub or shower.  If you need to sit down in the shower, use a plastic, non-slip stool.  Keep the floor dry. Clean up any water that spills on the floor as soon as it happens.  Remove soap buildup in the tub or shower regularly.  Attach bath mats securely with double-sided non-slip rug tape.  Do not have throw rugs and other things on the floor that can make you trip. What can I do in the bedroom?  Use  night lights.  Make sure that you have a light by your bed that is easy to reach.  Do not use any sheets or blankets that are too big for your bed. They should not hang down onto the floor.  Have a firm chair that has side arms. You can use this for support while you get dressed.  Do not have throw rugs and other things on the floor that can make you trip. What can I do in the kitchen?  Clean up any spills right away.  Avoid walking on wet floors.  Keep items that you use a lot in easy-to-reach places.  If you need to reach something above you, use a strong step stool that has a grab bar.  Keep electrical cords out of the way.  Do not use floor polish or wax that makes floors slippery. If you must use wax, use non-skid floor  wax.  Do not have throw rugs and other things on the floor that can make you trip. What can I do with my stairs?  Do not leave any items on the stairs.  Make sure that there are handrails on both sides of the stairs and use them. Fix handrails that are broken or loose. Make sure that handrails are as long as the stairways.  Check any carpeting to make sure that it is firmly attached to the stairs. Fix any carpet that is loose or worn.  Avoid having throw rugs at the top or bottom of the stairs. If you do have throw rugs, attach them to the floor with carpet tape.  Make sure that you have a light switch at the top of the stairs and the bottom of the stairs. If you do not have them, ask someone to add them for you. What else can I do to help prevent falls?  Wear shoes that: ? Do not have high heels. ? Have rubber bottoms. ? Are comfortable and fit you well. ? Are closed at the toe. Do not wear sandals.  If you use a stepladder: ? Make sure that it is fully opened. Do not climb a closed stepladder. ? Make sure that both sides of the stepladder are locked into place. ? Ask someone to hold it for you, if possible.  Clearly mark and make sure that you can see: ? Any grab bars or handrails. ? First and last steps. ? Where the edge of each step is.  Use tools that help you move around (mobility aids) if they are needed. These include: ? Canes. ? Walkers. ? Scooters. ? Crutches.  Turn on the lights when you go into a dark area. Replace any light bulbs as soon as they burn out.  Set up your furniture so you have a clear path. Avoid moving your furniture around.  If any of your floors are uneven, fix them.  If there are any pets around you, be aware of where they are.  Review your medicines with your doctor. Some medicines can make you feel dizzy. This can increase your chance of falling. Ask your doctor what other things that you can do to help prevent falls. This information is  not intended to replace advice given to you by your health care provider. Make sure you discuss any questions you have with your health care provider. Document Released: 10/31/2008 Document Revised: 06/12/2015 Document Reviewed: 02/08/2014 Elsevier Interactive Patient Education  2018 [place] Maintenance, Male A healthy lifestyle and preventive care is important  for your health and wellness. Ask your health care provider about what schedule of regular examinations is right for you. What should I know about weight and diet? Eat a Healthy Diet  Eat plenty of vegetables, fruits, whole grains, low-fat dairy products, and lean protein.  Do not eat a lot of foods high in solid fats, added sugars, or salt.  Maintain a Healthy Weight Regular exercise can help you achieve or maintain a healthy weight. You should:  Do at least 150 minutes of exercise each week. The exercise should increase your heart rate and make you sweat (moderate-intensity exercise).  Do strength-training exercises at least twice a week.  Watch Your Levels of Cholesterol and Blood Lipids  Have your blood tested for lipids and cholesterol every 5 years starting at 72 years of age. If you are at high risk for heart disease, you should start having your blood tested when you are 73 years old. You may need to have your cholesterol levels checked more often if: ? Your lipid or cholesterol levels are high. ? You are older than 72 years of age. ? You are at high risk for heart disease.  What should I know about cancer screening? Many types of cancers can be detected early and may often be prevented. Lung Cancer  You should be screened every year for lung cancer if: ? You are a current smoker who has smoked for at least 30 years. ? You are a former smoker who has quit within the past 15 years.  Talk to your health care provider about your screening options, when you should start screening, and how often you  should be screened.  Colorectal Cancer  Routine colorectal cancer screening usually begins at 72 years of age and should be repeated every 5-10 years until you are 72 years old. You may need to be screened more often if early forms of precancerous polyps or small growths are found. Your health care provider may recommend screening at an earlier age if you have risk factors for colon cancer.  Your health care provider may recommend using home test kits to check for hidden blood in the stool.  A small camera at the end of a tube can be used to examine your colon (sigmoidoscopy or colonoscopy). This checks for the earliest forms of colorectal cancer.  Prostate and Testicular Cancer  Depending on your age and overall health, your health care provider may do certain tests to screen for prostate and testicular cancer.  Talk to your health care provider about any symptoms or concerns you have about testicular or prostate cancer.  Skin Cancer  Check your skin from head to toe regularly.  Tell your health care provider about any new moles or changes in moles, especially if: ? There is a change in a mole's size, shape, or color. ? You have a mole that is larger than a pencil eraser.  Always use sunscreen. Apply sunscreen liberally and repeat throughout the day.  Protect yourself by wearing long sleeves, pants, a wide-brimmed hat, and sunglasses when outside.  What should I know about heart disease, diabetes, and high blood pressure?  If you are 58-55 years of age, have your blood pressure checked every 3-5 years. If you are 68 years of age or older, have your blood pressure checked every year. You should have your blood pressure measured twice-once when you are at a hospital or clinic, and once when you are not at a hospital or clinic. Record the average  of the two measurements. To check your blood pressure when you are not at a hospital or clinic, you can use: ? An automated blood pressure  machine at a pharmacy. ? A home blood pressure monitor.  Talk to your health care provider about your target blood pressure.  If you are between 45-21 years old, ask your health care provider if you should take aspirin to prevent heart disease.  Have regular diabetes screenings by checking your fasting blood sugar level. ? If you are at a normal weight and have a low risk for diabetes, have this test once every three years after the age of 27. ? If you are overweight and have a high risk for diabetes, consider being tested at a younger age or more often.  A one-time screening for abdominal aortic aneurysm (AAA) by ultrasound is recommended for men aged 11-75 years who are current or former smokers. What should I know about preventing infection? Hepatitis B If you have a higher risk for hepatitis B, you should be screened for this virus. Talk with your health care provider to find out if you are at risk for hepatitis B infection. Hepatitis C Blood testing is recommended for:  Everyone born from [birth year] through 1965.  Anyone with known risk factors for hepatitis C.  Sexually Transmitted Diseases (STDs)  You should be screened each year for STDs including gonorrhea and chlamydia if: ? You are sexually active and are younger than 72 years of age. ? You are older than 72 years of age and your health care provider tells you that you are at risk for this type of infection. ? Your sexual activity has changed since you were last screened and you are at an increased risk for chlamydia or gonorrhea. Ask your health care provider if you are at risk.  Talk with your health care provider about whether you are at high risk of being infected with HIV. Your health care provider may recommend a prescription medicine to help prevent HIV infection.  What else can I do?  Schedule regular health, dental, and eye exams.  Stay current with your vaccines (immunizations).  Do not use any tobacco products,  such as cigarettes, chewing tobacco, and e-cigarettes. If you need help quitting, ask your health care provider.  Limit alcohol intake to no more than 2 drinks per day. One drink equals 12 ounces of beer, 5 ounces of wine, or 1 ounces of hard liquor.  Do not use street drugs.  Do not share needles.  Ask your health care provider for help if you need support or information about quitting drugs.  Tell your health care provider if you often feel depressed.  Tell your health care provider if you have ever been abused or do not feel safe at home. This information is not intended to replace advice given to you by your health care provider. Make sure you discuss any questions you have with your health care provider. Document Released: 07/03/2007 Document Revised: 09/03/2015 Document Reviewed: 10/08/2014 Elsevier Interactive Patient Education  2018 [employer].   Hearing Loss Hearing loss is a partial or total loss of the ability to hear. This can be temporary or permanent, and it can happen in one or both ears. Hearing loss may be referred to as deafness. Medical care is necessary to treat hearing loss properly and to prevent the condition from getting worse. Your hearing may partially or completely come back, depending on what caused your hearing loss and how severe it  is. In some cases, hearing loss is permanent. What are the causes? Common causes of hearing loss include:  Too much wax in the ear canal.  Infection of the ear canal or middle ear.  Fluid in the middle ear.  Injury to the ear or surrounding area.  An object stuck in the ear.  Prolonged exposure to loud sounds, such as music.  Less common causes of hearing loss include:  Tumors in the ear.  Viral or bacterial infections, such as meningitis.  A hole in the eardrum (perforated eardrum).  Problems with the hearing nerve that sends signals between the brain and the ear.  Certain medicines.  What are the signs or  symptoms? Symptoms of this condition may include:  Difficulty telling the difference between sounds.  Difficulty following a conversation when there is background noise.  Lack of response to sounds in your environment. This may be most noticeable when you do not respond to startling sounds.  Needing to turn up the volume on the television, radio, etc.  Ringing in the ears.  Dizziness.  Pain in the ears.  How is this diagnosed? This condition is diagnosed based on a physical exam and a hearing test (audiometry). The audiometry test will be performed by a hearing specialist (audiologist). You may also be referred to an ear, nose, and throat (ENT) specialist (otolaryngologist). How is this treated? Treatment for recent onset of hearing loss may include:  Ear wax removal.  Being prescribed medicines to prevent infection (antibiotics).  Being prescribed medicines to reduce inflammation (corticosteroids).  Follow these instructions at home:  If you were prescribed an antibiotic medicine, take it as told by your health care provider. Do not stop taking the antibiotic even if you start to feel better.  Take over-the-counter and prescription medicines only as told by your health care provider.  Avoid loud noises.  Return to your normal activities as told by your health care provider. Ask your health care provider what activities are safe for you.  Keep all follow-up visits as told by your health care provider. This is important. Contact a health care provider if:  You feel dizzy.  You develop new symptoms.  You vomit or feel nauseous.  You have a fever. Get help right away if:  You develop sudden changes in your vision.  You have severe ear pain.  You have new or increased weakness.  You have a severe headache. This information is not intended to replace advice given to you by your health care provider. Make sure you discuss any questions you have with your health care  provider. Document Released: 01/04/2005 Document Revised: 06/12/2015 Document Reviewed: 05/22/2014 Elsevier Interactive Patient Education  2018 [employer].

## 2017-02-08 ENCOUNTER — Other Ambulatory Visit: Payer: Self-pay

## 2017-02-08 MED ORDER — ATORVASTATIN CALCIUM 40 MG PO TABS: ORAL_TABLET | ORAL | 1 refills | Status: DC

## 2017-02-10 ENCOUNTER — Ambulatory Visit (INDEPENDENT_AMBULATORY_CARE_PROVIDER_SITE_OTHER): Payer: Medicare Other | Admitting: Family Medicine

## 2017-02-10 ENCOUNTER — Encounter: Payer: Self-pay | Admitting: Family Medicine

## 2017-02-10 VITALS — BP 108/62 | HR 65 | Temp 97.8°F | Ht 67.0 in | Wt 149.0 lb

## 2017-02-10 DIAGNOSIS — N401 Enlarged prostate with lower urinary tract symptoms: Secondary | ICD-10-CM

## 2017-02-10 DIAGNOSIS — I10 Essential (primary) hypertension: Secondary | ICD-10-CM

## 2017-02-10 DIAGNOSIS — R351 Nocturia: Secondary | ICD-10-CM

## 2017-02-10 DIAGNOSIS — E785 Hyperlipidemia, unspecified: Secondary | ICD-10-CM

## 2017-02-10 DIAGNOSIS — G4700 Insomnia, unspecified: Secondary | ICD-10-CM | POA: Diagnosis not present

## 2017-02-10 DIAGNOSIS — M1A0790 Idiopathic chronic gout, unspecified ankle and foot, without tophus (tophi): Secondary | ICD-10-CM

## 2017-02-10 MED ORDER — INDOMETHACIN 25 MG PO CAPS: ORAL_CAPSULE | ORAL | 1 refills | Status: DC

## 2017-02-10 MED ORDER — CLONAZEPAM 1 MG PO TABS: ORAL_TABLET | ORAL | 1 refills | Status: DC

## 2017-02-10 NOTE — Patient Instructions (Addendum)
No changes in meds today  Labs look great  See you in 6 months for follow up   Do you think you could get [place] the records for the aneurysm screening in [place]?  Are you considering getting your hearing checked?  Are you interested in taking medicine for the prostate?  im ok with you stopping aspirin

## 2017-02-10 NOTE — Progress Notes (Signed)
Subjective:  Kevin Perez is a 72 y.o. year old very pleasant male patient who presents for/with See problem oriented charting ROS- reports tearfulness and sadness after loss of wife. No chest pain or shortness of breath . No edema.   Past Medical History-  Patient Active Problem List   Diagnosis Date Noted  . History of suicide attempt- while on high dose steroids. avoid prednisone.  01/19/2007    Priority: High  . Testicular swelling, left 04/30/2014    Priority: Medium  . BPH associated with nocturia 04/30/2014    Priority: Medium  . Gout 01/08/2014    Priority: Medium  . Insomnia 10/29/2013    Priority: Medium  . Hyperlipidemia 06/17/2009    Priority: Medium  . Arteritis ([place]) 05/23/2007    Priority: Medium  . Essential hypertension 07/21/2006    Priority: Medium  . Former smoker 04/30/2014    Priority: Low  . Prepatellar bursitis of right knee 01/22/2014    Priority: Low  . GERD 07/21/2006    Priority: Low    Medications- reviewed and updated Current Outpatient Medications  Medication Sig Dispense Refill  . allopurinol (ZYLOPRIM) 100 MG tablet TAKE 1 TABLET (100 MG TOTAL) BY MOUTH DAILY. 30 tablet 2  . aspirin 81 MG tablet Take 81 mg by mouth daily.      Marland Kitchen atorvastatin (LIPITOR) 40 MG tablet TAKE 1 TABLET (40 MG TOTAL) BY MOUTH TWICE A WEEK. 30 tablet 1  . calcium carbonate (TUMS - DOSED IN MG ELEMENTAL CALCIUM) 500 MG chewable tablet Chew 1 tablet by mouth as needed for indigestion or heartburn.    . clonazePAM (KLONOPIN) 1 MG tablet TAKE 1/2 TO 1 TABLET BY MOUTH EVERY DAY AS NEEDED 15 tablet 1  . indomethacin (INDOCIN) 25 MG capsule TAKE ONE CAPSULE BY MOUTH 3 TIMES A DAY AS NEEDED 60 capsule 1  . losartan (COZAAR) 100 MG tablet Take 1 tablet (100 mg total) by mouth daily. 90 tablet 0   Objective: BP 108/62 (BP Location: Left Arm, Patient Position: Sitting, Cuff Size: Large)   Pulse 65   Temp 97.8 F (36.6 C) (Oral)   Ht 5\' 7"  (1.702 m)   Wt 149 lb (67.6 kg)    SpO2 97%   BMI 23.34 kg/m  Gen: NAD, resting comfortably TM normal, oropharynx normal No cervical lymphadenopathy CV: RRR no murmurs rubs or gallops Lungs: CTAB no crackles, wheeze, rhonchi Abdomen: soft/nontender/nondistended/normal bowel sounds. Healthy weight  Ext: no edema Skin: warm, dry Psych: intermittently slightly tearful  Assessment/Plan:  Other notes 1. He will try to get AAA screen for [place] that he had at [facility] 2. Will plan to take the shingrix at [facility] 3. Weight loss noted- poor appetite grieving loss and impending loss of wife prior to that.  4. Declines follow up for hearing loss though [facility]  Essential hypertension S: controlled on losartan 100mg  alone BP Readings from Last 3 Encounters:  02/10/17 108/62  02/03/17 132/70  12/31/15 140/70  A/P: We discussed blood pressure goal of <140/90. Continue current meds   Gout S: mild attack 3 months ago, otherwise doing well-. Compliant with allopurinol 100mg . Likes to have indomethacin on hand.  Lab Results  Component Value Date   LABURIC 5.6 11/25/2014  A/P: need to update uric acid next labs. Continue current medications  Hyperlipidemia S: reasonably controlled on atorvastatin 40mg  twice a week. No myalgias.  Lab Results  Component Value Date   CHOL 135 02/03/2017  HDL 34.40 (L) 02/03/2017   LDLCALC 84 02/03/2017   LDLDIRECT 107.0 11/25/2014   TRIG 85.0 02/03/2017   CHOLHDL 4 02/03/2017   A/P: continue current rx- LDL at goal under 100   Told him he could stop asa if desired as bleeding risk likely similar to CV reduction- he would prefer to continue   Insomnia Refilled klonopin today to help him with sleep- he is almost out and recently lost his wife 01/2017 after long term battle with incurable mesothelioma  BPH associated with nocturia Nocturia 3x a night- declines rx for now. psa was trending up when last checked and had considered 1 year repeat (that being said had been  4 years and only an increase 0.47 from 2011 so trend not drastic)- we can complete this with future labs just to make sure not having large spike.   Future Appointments  Date Time Provider [place]  08/10/2017  9:45 AM [name], MD [facility]   Meds ordered this encounter  Medications  . clonazePAM (KLONOPIN) 1 MG tablet    Sig: TAKE 1/2 TO 1 TABLET BY MOUTH EVERY DAY AS NEEDED    Dispense:  15 tablet    Refill:  1  . indomethacin (INDOCIN) 25 MG capsule    Sig: TAKE ONE CAPSULE BY MOUTH 3 TIMES A DAY AS NEEDED    Dispense:  60 capsule    Refill:  1    Return precautions advised.  [name], MD

## 2017-02-12 NOTE — Assessment & Plan Note (Addendum)
Nocturia 3x a night- declines rx for now. psa was trending up when last checked and had considered 1 year repeat (that being said had been 4 years and only an increase 0.47 from 2011 so trend not drastic)- we can complete this with future labs just to make sure not having large spike.

## 2017-02-12 NOTE — Assessment & Plan Note (Signed)
S: mild attack 3 months ago, otherwise doing well-. Compliant with allopurinol 100mg . Likes to have indomethacin on hand.  Lab Results  Component Value Date   LABURIC 5.6 11/25/2014  A/P: need to update uric acid next labs. Continue current medications

## 2017-02-12 NOTE — Assessment & Plan Note (Signed)
S: controlled on losartan 100mg  alone BP Readings from Last 3 Encounters:  02/10/17 108/62  02/03/17 132/70  12/31/15 140/70  A/P: We discussed blood pressure goal of <140/90. Continue current meds

## 2017-02-12 NOTE — Assessment & Plan Note (Addendum)
S: reasonably controlled on atorvastatin 40mg  twice a week. No myalgias.  Lab Results  Component Value Date   CHOL 135 02/03/2017   HDL 34.40 (L) 02/03/2017   LDLCALC 84 02/03/2017   LDLDIRECT 107.0 11/25/2014   TRIG 85.0 02/03/2017   CHOLHDL 4 02/03/2017   A/P: continue current rx- LDL at goal under 100   Told him he could stop asa if desired as bleeding risk likely similar to CV reduction- he would prefer to continue

## 2017-02-12 NOTE — Assessment & Plan Note (Signed)
Refilled klonopin today to help him with sleep- he is almost out and recently lost his wife 01/2017 after long term battle with incurable mesothelioma

## 2017-03-10 ENCOUNTER — Other Ambulatory Visit: Payer: Self-pay

## 2017-03-10 ENCOUNTER — Telehealth: Payer: Self-pay | Admitting: Family Medicine

## 2017-03-10 MED ORDER — ALLOPURINOL 100 MG PO TABS: ORAL_TABLET | ORAL | 2 refills | Status: DC

## 2017-03-10 NOTE — Telephone Encounter (Signed)
Copied from [place]. Topic: Quick Communication - Rx Refill/Question >> Mar 10, 2017  8:10 AM Synthia Innocent wrote: Medication: allopurinol (ZYLOPRIM) 100 MG tablet and losartan (COZAAR) 100 MG tablet    Has the patient contacted their pharmacy? Yes.     (Agent: If no, request that the patient contact the pharmacy for the refill.)   Preferred Pharmacy (with phone number or street name): [facility] in [place], [phone]   Agent: Please be advised that RX refills may take up to 3 business days. We ask that you follow-up with your pharmacy.

## 2017-03-11 ENCOUNTER — Other Ambulatory Visit: Payer: Self-pay

## 2017-03-11 MED ORDER — LOSARTAN POTASSIUM 100 MG PO TABS: 100.0000 mg | ORAL_TABLET | Freq: Every day | ORAL | 2 refills | Status: DC

## 2017-05-02 DIAGNOSIS — H04123 Dry eye syndrome of bilateral lacrimal glands: Secondary | ICD-10-CM | POA: Diagnosis not present

## 2017-05-02 DIAGNOSIS — H5203 Hypermetropia, bilateral: Secondary | ICD-10-CM | POA: Diagnosis not present

## 2017-05-02 DIAGNOSIS — H52203 Unspecified astigmatism, bilateral: Secondary | ICD-10-CM | POA: Diagnosis not present

## 2017-05-02 DIAGNOSIS — H40013 Open angle with borderline findings, low risk, bilateral: Secondary | ICD-10-CM | POA: Diagnosis not present

## 2017-05-02 DIAGNOSIS — H524 Presbyopia: Secondary | ICD-10-CM | POA: Diagnosis not present

## 2017-05-19 ENCOUNTER — Ambulatory Visit: Payer: Self-pay | Admitting: *Deleted

## 2017-05-19 NOTE — Telephone Encounter (Signed)
Pt removed a tick yesterday from his r upper leg  posterior area near gluteal fold. The area is  Red swollen and  Tender  He has swelling of his lymph node in inguinal area as well.The tick is captive and in his possession . APPT made  For tommorow with Dr Jerline Pain    Reason for Disposition . [1] Red or very tender (to touch) area AND [2] started over 24 hours after the bite  Answer Assessment - Initial Assessment Questions 1. TYPE of TICK: "Is it a wood tick or a deer tick?" If unsure, ask: "What size was the tick?" "Did it look more like a watermelon seed or a poppy seed?"      Possibly a  Wood tick   2. LOCATION: "Where is the tick bite located?"       r upper leg near the buttock crease  3. ONSET: "How long do you think the tick was attached before you removed it?" (Hours or days)       Removed  Yesterday  -  4. TETANUS: "When was the last tetanus booster?"         Unsure   5. PREGNANCY: "Is there any chance you are pregnant?" "When was your last menstrual period?"     N/a  Protocols used: TICK BITE-A-AH

## 2017-05-20 ENCOUNTER — Ambulatory Visit (INDEPENDENT_AMBULATORY_CARE_PROVIDER_SITE_OTHER): Payer: Medicare Other | Admitting: Family Medicine

## 2017-05-20 ENCOUNTER — Encounter: Payer: Self-pay | Admitting: Family Medicine

## 2017-05-20 VITALS — BP 132/62 | HR 69 | Temp 98.1°F | Resp 16 | Ht 70.75 in | Wt 152.0 lb

## 2017-05-20 DIAGNOSIS — R21 Rash and other nonspecific skin eruption: Secondary | ICD-10-CM

## 2017-05-20 DIAGNOSIS — W57XXXA Bitten or stung by nonvenomous insect and other nonvenomous arthropods, initial encounter: Secondary | ICD-10-CM

## 2017-05-20 DIAGNOSIS — S70361A Insect bite (nonvenomous), right thigh, initial encounter: Secondary | ICD-10-CM

## 2017-05-20 MED ORDER — DOXYCYCLINE HYCLATE 100 MG PO TABS: 100.0000 mg | ORAL_TABLET | Freq: Two times a day (BID) | ORAL | 0 refills | Status: DC

## 2017-05-20 NOTE — Progress Notes (Signed)
    Subjective:  Kevin Perez is a 72 y.o. male who presents today for same-day appointment with a chief complaint of skins lesion.   HPI:  Skin Lesion, Acute Issue Started 2 days ago. Worsened over that time. Patient pulled off a tick from his left leg and thinks that what caused the rash.  Rash has worsened over the last couple of days.  Patient is not sure how long the tick was attached.  Tried over-the-counter creams for his itch which is helped a little bit.  He has had some burning and itching to the area.  No fevers or chills.  No reported joint pain.  ROS: Per HPI  PMH: He reports that he has quit smoking. His smoking use included cigarettes. He has a 1.00 pack-year smoking history. He quit smokeless tobacco use about 27 years ago. His smokeless tobacco use included chew. He reports that he drinks about 1.2 oz of alcohol per week. He reports that he does not use drugs.  Objective:  Physical Exam: BP 132/62 (BP Location: Left Arm)   Pulse 69   Temp 98.1 F (36.7 C) (Oral)   Resp 16   Ht 5' 10.75" (1.797 m)   Wt 152 lb (68.9 kg)   SpO2 98%   BMI 21.35 kg/m   Gen: NAD, resting comfortably Skin: Approximately 20 cm x 10 cm erythematous rash on right upper thigh with central punctate lesion.  Mild right inguinal lymphadenopathy noted.  No purulence.  Assessment/Plan:  Tick bite/cellulitis We will cover patient for cellulitis with a 1 week course of doxycycline 100 mg twice daily.  Rash is not consistent with Lyme disease or RMSF, however doxycycline would cover both of these.  He does not have any signs of systemic illness.  Recommended him continue using his over-the-counter anti-itch creams or over-the-counter hydrocortisone cream as needed for itching.  Discussed reasons to return to care including worsening rash or development of fevers, chills, or joint pain.  Follow-up as needed.  Algis Greenhouse. Jerline Pain, MD 05/20/2017 8:25 AM

## 2017-05-20 NOTE — Patient Instructions (Signed)
Please start the doxycycline.  You can continue using your anti-itch cream. You can also try using over the counter hydrocortisone cream for your itching.  Please let me know if your symptoms worsen or do not improve over the next 5 to 7 days.  Take care, Dr. Jerline Pain

## 2017-06-01 ENCOUNTER — Other Ambulatory Visit: Payer: Self-pay | Admitting: Family Medicine

## 2017-07-19 DIAGNOSIS — H35713 Central serous chorioretinopathy, bilateral: Secondary | ICD-10-CM | POA: Diagnosis not present

## 2017-07-19 DIAGNOSIS — H40003 Preglaucoma, unspecified, bilateral: Secondary | ICD-10-CM | POA: Diagnosis not present

## 2017-07-19 DIAGNOSIS — H2513 Age-related nuclear cataract, bilateral: Secondary | ICD-10-CM | POA: Diagnosis not present

## 2017-08-10 ENCOUNTER — Ambulatory Visit (INDEPENDENT_AMBULATORY_CARE_PROVIDER_SITE_OTHER): Payer: Medicare Other | Admitting: Family Medicine

## 2017-08-10 ENCOUNTER — Encounter: Payer: Self-pay | Admitting: Family Medicine

## 2017-08-10 DIAGNOSIS — G4700 Insomnia, unspecified: Secondary | ICD-10-CM | POA: Diagnosis not present

## 2017-08-10 DIAGNOSIS — N401 Enlarged prostate with lower urinary tract symptoms: Secondary | ICD-10-CM | POA: Diagnosis not present

## 2017-08-10 DIAGNOSIS — I10 Essential (primary) hypertension: Secondary | ICD-10-CM

## 2017-08-10 DIAGNOSIS — E785 Hyperlipidemia, unspecified: Secondary | ICD-10-CM | POA: Diagnosis not present

## 2017-08-10 DIAGNOSIS — R351 Nocturia: Secondary | ICD-10-CM | POA: Diagnosis not present

## 2017-08-10 DIAGNOSIS — M1A0790 Idiopathic chronic gout, unspecified ankle and foot, without tophus (tophi): Secondary | ICD-10-CM

## 2017-08-10 LAB — COMPREHENSIVE METABOLIC PANEL
ALT: 12 U/L (ref 0–53)
AST: 16 U/L (ref 0–37)
Albumin: 4.3 g/dL (ref 3.5–5.2)
Alkaline Phosphatase: 94 U/L (ref 39–117)
BILIRUBIN TOTAL: 0.8 mg/dL (ref 0.2–1.2)
BUN: 15 mg/dL (ref 6–23)
CO2: 30 mEq/L (ref 19–32)
Calcium: 9.7 mg/dL (ref 8.4–10.5)
Chloride: 101 mEq/L (ref 96–112)
Creatinine, Ser: 1.16 mg/dL (ref 0.40–1.50)
GFR: 65.74 mL/min (ref >60.00)
GLUCOSE: 100 mg/dL — AB (ref 70–99)
POTASSIUM: 4.5 meq/L (ref 3.5–5.1)
SODIUM: 137 meq/L (ref 135–145)
TOTAL PROTEIN: 8 g/dL (ref 6.0–8.3)

## 2017-08-10 LAB — URIC ACID: Uric Acid, Serum: 4.5 mg/dL (ref 4.0–7.8)

## 2017-08-10 LAB — PSA: PSA: 4.63 ng/mL — ABNORMAL HIGH (ref 0.10–4.00)

## 2017-08-10 LAB — LDL CHOLESTEROL, DIRECT: Direct LDL: 94 mg/dL

## 2017-08-10 NOTE — Assessment & Plan Note (Signed)
S: controlled on losartan 100mg  BP Readings from Last 3 Encounters:  08/10/17 118/70  05/20/17 132/62  02/10/17 108/62  A/P: Continue current meds:  At goal today

## 2017-08-10 NOTE — Assessment & Plan Note (Signed)
S: compliant with allopurinol 100mg . Has indomethacin on hand. No flares since last visit Lab Results  Component Value Date   LABURIC 5.6 11/25/2014  A/P: update uric acid

## 2017-08-10 NOTE — Patient Instructions (Addendum)
Please check with your pharmacy to see if they have the shingrix vaccine. If they do- please get this immunization and update [place] by phone call or mychart with dates you receive the vaccine  Please stop by lab before you go  Could go ahead and sign up for a wellness visit on 02/03/18 or later as well

## 2017-08-10 NOTE — Assessment & Plan Note (Signed)
S: PSA was trending up last check but infrequent checks. He does not want rx for bph with nocturia (about 3x a night) Lab Results  Component Value Date   PSA 2.17 04/30/2014   PSA 1.38 07/17/2010   PSA 1.70 06/10/2009  A/P: update PSA today. Discussed possibly stopping PSA if this next one is stable.

## 2017-08-10 NOTE — Assessment & Plan Note (Signed)
S:  controlled on atorvastatin 40mg  twice a week with LDL at 89 on last check in January 2019. We have discussed stopping aspirin- he prefers to continue  A/P: continue current rx as long as LDL under 100- update today

## 2017-08-10 NOTE — Progress Notes (Signed)
Subjective:  Kevin Perez is a 72 y.o. year old very pleasant male patient who presents for/with See problem oriented charting ROS- No chest pain or shortness of breath. No headache or blurry vision.    Past Medical History-  Patient Active Problem List   Diagnosis Date Noted  . History of suicide attempt- while on high dose steroids. avoid prednisone.  01/19/2007    Priority: High  . Testicular swelling, left 04/30/2014    Priority: Medium  . BPH associated with nocturia 04/30/2014    Priority: Medium  . Gout 01/08/2014    Priority: Medium  . Insomnia 10/29/2013    Priority: Medium  . Hyperlipidemia 06/17/2009    Priority: Medium  . Arteritis ([place]) 05/23/2007    Priority: Medium  . Essential hypertension 07/21/2006    Priority: Medium  . Former smoker 04/30/2014    Priority: Low  . Prepatellar bursitis of right knee 01/22/2014    Priority: Low  . GERD 07/21/2006    Priority: Low    Medications- reviewed and updated Current Outpatient Medications  Medication Sig Dispense Refill  . allopurinol (ZYLOPRIM) 100 MG tablet TAKE 1 TABLET BY MOUTH EVERY DAY 30 tablet 5  . aspirin 81 MG tablet Take 81 mg by mouth daily.      Marland Kitchen atorvastatin (LIPITOR) 40 MG tablet TAKE 1 TABLET (40 MG TOTAL) BY MOUTH TWICE A WEEK. 30 tablet 1  . calcium carbonate (TUMS - DOSED IN MG ELEMENTAL CALCIUM) 500 MG chewable tablet Chew 1 tablet by mouth as needed for indigestion or heartburn.    . clonazePAM (KLONOPIN) 1 MG tablet TAKE 1/2 TO 1 TABLET BY MOUTH EVERY DAY AS NEEDED 15 tablet 1  . indomethacin (INDOCIN) 25 MG capsule TAKE ONE CAPSULE BY MOUTH 3 TIMES A DAY AS NEEDED 60 capsule 1  . losartan (COZAAR) 100 MG tablet Take 1 tablet (100 mg total) by mouth daily. 90 tablet 2   No current facility-administered medications for this visit.     Objective: BP 118/70 (BP Location: Left Arm, Patient Position: Sitting, Cuff Size: Large)   Pulse 61   Temp 97.9 F (36.6 C) (Oral)   Ht 5' 10.75"  (1.797 m)   Wt 150 lb 3.2 oz (68.1 kg)   SpO2 97%   BMI 21.10 kg/m  Gen: NAD, resting comfortably CV: RRR no murmurs rubs or gallops Lungs: CTAB no crackles, wheeze, rhonchi Abdomen: soft/nontender/nondistended/normal bowel sounds. Ext: no edema Skin: warm, dry  Assessment/Plan:  Other notes: 1.AAA scan from [facility] - he dropped this off for [place] 2. Still trying to get shingrix- reminder today  Essential hypertension S: controlled on losartan 100mg  BP Readings from Last 3 Encounters:  08/10/17 118/70  05/20/17 132/62  02/10/17 108/62  A/P: Continue current meds:  At goal today  Gout S: compliant with allopurinol 100mg . Has indomethacin on hand. No flares since last visit Lab Results  Component Value Date   LABURIC 5.6 11/25/2014  A/P: update uric acid  Hyperlipidemia S:  controlled on atorvastatin 40mg  twice a week with LDL at 89 on last check in January 2019. We have discussed stopping aspirin- he prefers to continue  A/P: continue current rx as long as LDL under 100- update today  BPH associated with nocturia S: PSA was trending up last check but infrequent checks. He does not want rx for bph with nocturia (about 3x a night) Lab Results  Component Value Date   PSA 2.17 04/30/2014   PSA 1.38 07/17/2010  PSA 1.70 06/10/2009  A/P: update PSA today. Discussed possibly stopping PSA if this next one is stable.   Insomnia S:  klonopin really helping with sleep after loss of wife to incurable mesothelioma 01/2017. He has not taken this much at all- #15 given with 1 refill in January and not used it all A/P: can refill if needed given sparing use  Return in about 6 months (around 02/10/2018) for follow up- or sooner if needed.  Could go ahead and sign up for a wellness visit on 02/03/18 or later as well  Lab/Order associations: Essential hypertension - Plan: Comprehensive metabolic panel  Idiopathic chronic gout of foot without tophus, unspecified laterality - Plan: Uric  acid  Hyperlipidemia, unspecified hyperlipidemia type - Plan: LDL cholesterol, direct  BPH associated with nocturia - Plan: PSA  Insomnia, unspecified type  Return precautions advised.  [name], MD

## 2017-08-10 NOTE — Assessment & Plan Note (Signed)
S:  klonopin really helping with sleep after loss of wife to incurable mesothelioma 01/2017. He has not taken this much at all- #15 given with 1 refill in January and not used it all A/P: can refill if needed given sparing use

## 2017-08-17 ENCOUNTER — Ambulatory Visit: Payer: Self-pay | Admitting: *Deleted

## 2017-08-17 NOTE — Telephone Encounter (Signed)
  Reason for Disposition . General information question, no triage required and triager able to answer question  Answer Assessment - Initial Assessment Questions 1. REASON FOR CALL or QUESTION: "What is your reason for calling today?" or "How can I best help you?" or "What question do you have that I can help answer?"     How was PSA obtained because he did not leave a urine specimen?  Protocols used: INFORMATION ONLY CALL-A-AH

## 2017-08-17 NOTE — Telephone Encounter (Signed)
Pt called to clarify if his PSA was obtained; he says that he did not give a urine; explained to pt that his PSA was obtained from a blood sample; the pt verbalizes understanding.

## 2017-09-12 ENCOUNTER — Other Ambulatory Visit: Payer: Self-pay | Admitting: Family Medicine

## 2017-09-23 ENCOUNTER — Other Ambulatory Visit: Payer: Self-pay | Admitting: Family Medicine

## 2017-10-06 ENCOUNTER — Telehealth: Payer: Self-pay

## 2017-10-06 NOTE — Telephone Encounter (Signed)
Pt coming for labs 10/12/17. Please place future order/s.  Thank you.

## 2017-10-11 ENCOUNTER — Other Ambulatory Visit: Payer: Self-pay

## 2017-10-11 DIAGNOSIS — N401 Enlarged prostate with lower urinary tract symptoms: Secondary | ICD-10-CM

## 2017-10-11 DIAGNOSIS — R351 Nocturia: Principal | ICD-10-CM

## 2017-10-11 NOTE — Telephone Encounter (Signed)
Team- please see result note on 08/10/17 and enter needed orders. I think just PSA under elevated psa

## 2017-10-11 NOTE — Telephone Encounter (Signed)
Future orders placed for PSA.

## 2017-10-12 ENCOUNTER — Other Ambulatory Visit (INDEPENDENT_AMBULATORY_CARE_PROVIDER_SITE_OTHER): Payer: Medicare Other

## 2017-10-12 DIAGNOSIS — N401 Enlarged prostate with lower urinary tract symptoms: Secondary | ICD-10-CM

## 2017-10-12 DIAGNOSIS — R351 Nocturia: Secondary | ICD-10-CM | POA: Diagnosis not present

## 2017-10-12 LAB — PSA: PSA: 2.8 ng/mL (ref 0.10–4.00)

## 2017-10-25 ENCOUNTER — Ambulatory Visit (INDEPENDENT_AMBULATORY_CARE_PROVIDER_SITE_OTHER): Payer: Medicare Other | Admitting: Family Medicine

## 2017-10-25 ENCOUNTER — Encounter: Payer: Self-pay | Admitting: Family Medicine

## 2017-10-25 VITALS — BP 142/66 | HR 57 | Temp 97.5°F | Ht 70.75 in | Wt 149.4 lb

## 2017-10-25 DIAGNOSIS — N401 Enlarged prostate with lower urinary tract symptoms: Secondary | ICD-10-CM | POA: Diagnosis not present

## 2017-10-25 DIAGNOSIS — R351 Nocturia: Secondary | ICD-10-CM | POA: Diagnosis not present

## 2017-10-25 DIAGNOSIS — R350 Frequency of micturition: Secondary | ICD-10-CM | POA: Diagnosis not present

## 2017-10-25 DIAGNOSIS — I10 Essential (primary) hypertension: Secondary | ICD-10-CM | POA: Diagnosis not present

## 2017-10-25 LAB — POCT URINALYSIS DIPSTICK
Bilirubin, UA: NEGATIVE
Blood, UA: NEGATIVE
Glucose, UA: NEGATIVE
Ketones, UA: NEGATIVE
Leukocytes, UA: NEGATIVE
Nitrite, UA: NEGATIVE
Protein, UA: NEGATIVE
Spec Grav, UA: 1.02 (ref 1.010–1.025)
Urobilinogen, UA: 0.2 E.U./dL
pH, UA: 6 (ref 5.0–8.0)

## 2017-10-25 NOTE — Assessment & Plan Note (Signed)
S: poorly controlled on losartan 100mg  .Home #s generally 110/60s BP Readings from Last 3 Encounters:  10/25/17 (!) 142/66  08/10/17 118/70  05/20/17 132/62  A/P: We discussed blood pressure goal of <140/90. Continue current meds:  This is first elevation he has had in office recently and home #s look great- will recheck in January. He will see [place] back if home BPs are high

## 2017-10-25 NOTE — Patient Instructions (Signed)
Frequent urination likely due to large prostate. we will check PSA level in January. You opted out of medicine for now but let me know if you change your mind.   Can read more about flomax/tamsulosin which is generally my first choice.   Blood pressure hair high. Try to exercise regularly and cut down on the salt and hopefully we can prevent adding on blood pressure medicine

## 2017-10-25 NOTE — Progress Notes (Signed)
Subjective:  Kevin Perez is a 72 y.o. year old very pleasant male patient who presents for/with See problem oriented charting ROS- nocturia, weak stream noted. Sometimes no issues with frequency in the day. No chest pain or shortness of breath reported.    Past Medical History-  Patient Active Problem List   Diagnosis Date Noted  . History of suicide attempt- while on high dose steroids. avoid prednisone.  01/19/2007    Priority: High  . Testicular swelling, left 04/30/2014    Priority: Medium  . BPH associated with nocturia 04/30/2014    Priority: Medium  . Gout 01/08/2014    Priority: Medium  . Insomnia 10/29/2013    Priority: Medium  . Hyperlipidemia 06/17/2009    Priority: Medium  . Arteritis ([place]) 05/23/2007    Priority: Medium  . Essential hypertension 07/21/2006    Priority: Medium  . Former smoker 04/30/2014    Priority: Low  . Prepatellar bursitis of right knee 01/22/2014    Priority: Low  . GERD 07/21/2006    Priority: Low    Medications- reviewed and updated Current Outpatient Medications  Medication Sig Dispense Refill  . allopurinol (ZYLOPRIM) 100 MG tablet TAKE 1 TABLET BY MOUTH EVERY DAY 90 tablet 1  . aspirin 81 MG tablet Take 81 mg by mouth daily.      Marland Kitchen atorvastatin (LIPITOR) 40 MG tablet TAKE 1 TABLET BY MOUTH TWICE A WEEK 24 tablet 2  . calcium carbonate (TUMS - DOSED IN MG ELEMENTAL CALCIUM) 500 MG chewable tablet Chew 1 tablet by mouth as needed for indigestion or heartburn.    . clonazePAM (KLONOPIN) 1 MG tablet TAKE 1/2 TO 1 TABLET BY MOUTH EVERY DAY AS NEEDED 15 tablet 1  . indomethacin (INDOCIN) 25 MG capsule TAKE ONE CAPSULE BY MOUTH 3 TIMES A DAY AS NEEDED 60 capsule 1  . losartan (COZAAR) 100 MG tablet Take 1 tablet (100 mg total) by mouth daily. 90 tablet 2   No current facility-administered medications for this visit.     Objective: BP (!) 142/66 (BP Location: Left Arm, Cuff Size: Normal)   Pulse (!) 57   Temp (!) 97.5 F (36.4 C)  (Oral)   Ht 5' 10.75" (1.797 m)   Wt 149 lb 6.4 oz (67.8 kg)   SpO2 98%   BMI 20.98 kg/m  Gen: NAD, resting comfortably CV: RRR no murmurs rubs or gallops Lungs: CTAB no crackles, wheeze, rhonchi Abdomen: soft/nontender/nondistended/normal bowel sounds Ext: no edema Skin: warm, dry  Did not update rectal exam today. Will plan on this next visit  Results for orders placed or performed in visit on 10/25/17 (from the past 24 hour(s))  POCT Urinalysis Dipstick     Status: Normal   Collection Time: 10/25/17 10:00 AM  Result Value Ref Range   Color, UA Yellow    Clarity, UA Clear    Glucose, UA Negative Negative   Bilirubin, UA Negative    Ketones, UA Negative    Spec Grav, UA 1.020 1.010 - 1.025   Blood, UA Negative    pH, UA 6.0 5.0 - 8.0   Protein, UA Negative Negative   Urobilinogen, UA 0.2 0.2 or 1.0 E.U./dL   Nitrite, UA Negative    Leukocytes, UA Negative Negative   Appearance     Odor     Assessment/Plan:  Essential hypertension S: poorly controlled on losartan 100mg  .Home #s generally 110/60s BP Readings from Last 3 Encounters:  10/25/17 (!) 142/66  08/10/17 118/70  05/20/17  132/62  A/P: We discussed blood pressure goal of <140/90. Continue current meds:  This is first elevation he has had in office recently and home #s look great- will recheck in January. He will see [place] back if home BPs are high  BPH associated with nocturia S:  last visit in July complained of nocturia 3x a night. We had discussed potential rx but he declined at that time. He returns today wanting to recheck urine and worried about prior PSA trend (we reviewed trend and opted for 6 month repeat for him as this would give him some peace of mind) Lab Results  Component Value Date   PSA 2.80 10/12/2017   PSA 4.63 (H) 08/10/2017   PSA 2.17 04/30/2014  A/P: from avs "Frequent urination likely due to large prostate. we will check PSA level in January. You opted out of medicine for now but let me know  if you change your mind.   Can read more about flomax/tamsulosin which is generally my first choice. " - psa in January per patient request  Future Appointments  Date Time Provider [place]  02/09/2018  9:00 AM [facility]  02/09/2018 10:20 AM [name], MD [facility]   Lab/Order associations: Urinary frequency - Plan: POCT Urinalysis Dipstick, Urine Culture  Essential hypertension  BPH associated with nocturia  Return precautions advised.  [name], MD

## 2017-10-26 LAB — URINE CULTURE
MICRO NUMBER:: 91208626
RESULT: NO GROWTH
SPECIMEN QUALITY:: ADEQUATE

## 2017-10-26 NOTE — Assessment & Plan Note (Signed)
S:  last visit in July complained of nocturia 3x a night. We had discussed potential rx but he declined at that time. He returns today wanting to recheck urine and worried about prior PSA trend (we reviewed trend and opted for 6 month repeat for him as this would give him some peace of mind) Lab Results  Component Value Date   PSA 2.80 10/12/2017   PSA 4.63 (H) 08/10/2017   PSA 2.17 04/30/2014  A/P: from avs "Frequent urination likely due to large prostate. we will check PSA level in January. You opted out of medicine for now but let me know if you change your mind.   Can read more about flomax/tamsulosin which is generally my first choice. " - psa in January per patient request

## 2017-12-06 ENCOUNTER — Other Ambulatory Visit: Payer: Self-pay | Admitting: Family Medicine

## 2018-02-09 ENCOUNTER — Ambulatory Visit: Payer: Medicare Other | Admitting: Family Medicine

## 2018-02-09 ENCOUNTER — Ambulatory Visit: Payer: Medicare Other

## 2018-02-15 ENCOUNTER — Ambulatory Visit (INDEPENDENT_AMBULATORY_CARE_PROVIDER_SITE_OTHER): Payer: Medicare Other

## 2018-02-15 ENCOUNTER — Ambulatory Visit (INDEPENDENT_AMBULATORY_CARE_PROVIDER_SITE_OTHER): Payer: Medicare Other | Admitting: Family Medicine

## 2018-02-15 ENCOUNTER — Encounter: Payer: Self-pay | Admitting: Family Medicine

## 2018-02-15 VITALS — BP 138/60 | HR 63 | Temp 97.7°F | Ht 70.75 in | Wt 146.2 lb

## 2018-02-15 VITALS — BP 108/56 | HR 70 | Ht 70.75 in | Wt 147.8 lb

## 2018-02-15 DIAGNOSIS — I10 Essential (primary) hypertension: Secondary | ICD-10-CM

## 2018-02-15 DIAGNOSIS — E785 Hyperlipidemia, unspecified: Secondary | ICD-10-CM

## 2018-02-15 DIAGNOSIS — Z0000 Encounter for general adult medical examination without abnormal findings: Secondary | ICD-10-CM | POA: Diagnosis not present

## 2018-02-15 DIAGNOSIS — M1A0790 Idiopathic chronic gout, unspecified ankle and foot, without tophus (tophi): Secondary | ICD-10-CM | POA: Diagnosis not present

## 2018-02-15 LAB — URINALYSIS
Bilirubin Urine: NEGATIVE
Hgb urine dipstick: NEGATIVE
Ketones, ur: NEGATIVE
Leukocytes, UA: NEGATIVE
NITRITE: NEGATIVE
Specific Gravity, Urine: 1.02 (ref 1.000–1.030)
Total Protein, Urine: NEGATIVE
UROBILINOGEN UA: 0.2 (ref 0.0–1.0)
Urine Glucose: NEGATIVE
pH: 6.5 (ref 5.0–8.0)

## 2018-02-15 LAB — COMPREHENSIVE METABOLIC PANEL
ALBUMIN: 4.5 g/dL (ref 3.5–5.2)
ALT: 10 U/L (ref 0–53)
AST: 15 U/L (ref 0–37)
Alkaline Phosphatase: 89 U/L (ref 39–117)
BUN: 14 mg/dL (ref 6–23)
CALCIUM: 9.9 mg/dL (ref 8.4–10.5)
CHLORIDE: 100 meq/L (ref 96–112)
CO2: 31 mEq/L (ref 19–32)
Creatinine, Ser: 1.06 mg/dL (ref 0.40–1.50)
GFR: 68.53 mL/min (ref >60.00)
Glucose, Bld: 94 mg/dL (ref 70–99)
POTASSIUM: 4.2 meq/L (ref 3.5–5.1)
Sodium: 137 mEq/L (ref 135–145)
Total Bilirubin: 0.8 mg/dL (ref 0.2–1.2)
Total Protein: 7.7 g/dL (ref 6.0–8.3)

## 2018-02-15 LAB — CBC
HCT: 45.2 % (ref 39.0–52.0)
Hemoglobin: 15.2 g/dL (ref 13.0–17.0)
MCHC: 33.6 g/dL (ref 30.0–36.0)
MCV: 90 fl (ref 78.0–100.0)
Platelets: 176 10*3/uL (ref 150.0–400.0)
RBC: 5.02 Mil/uL (ref 4.22–5.81)
RDW: 14.1 % (ref 11.5–15.5)
WBC: 6.8 10*3/uL (ref 4.0–10.5)

## 2018-02-15 LAB — LIPID PANEL
CHOLESTEROL: 146 mg/dL (ref 0–200)
HDL: 33 mg/dL — ABNORMAL LOW (ref >39.00)
LDL Cholesterol: 95 mg/dL (ref 0–99)
NonHDL: 112.72
TRIGLYCERIDES: 91 mg/dL (ref 0.0–149.0)
Total CHOL/HDL Ratio: 4
VLDL: 18.2 mg/dL (ref 0.0–40.0)

## 2018-02-15 NOTE — Progress Notes (Signed)
PCP notes: Last OV: 02/15/2018   Health maintenance:Up to Date   Abnormal screenings: MMSE score of 26   Patient concerns: None   Nurse concerns:None   Next PCP appt: 08/23/2018

## 2018-02-15 NOTE — Patient Instructions (Addendum)
Please stop by lab before you go If you do not have mychart- we will call you about results within 5 business days of us receiving them.  If you have mychart- we will send your results within 3 business days of us receiving them.  If abnormal or we want to clarify a result, we will call or mychart you to make sure you receive the message.  If you have questions or concerns or don't hear within 5-7 days, please send us a message or call us.    

## 2018-02-15 NOTE — Progress Notes (Signed)
Subjective:   Kevin Perez is a 73 y.o. male who presents for Medicare Annual/Subsequent preventive examination.  Review of Systems:  No ROS.  Medicare Wellness Visit. Additional risk factors are reflected in the social history. Cardiac Risk Factors include: advanced age (>88men, >25 women);male gender  Patient is a widow who lives in a single story home. He has a Magazine features editor, Teacher, adult education that is a Conservation officer, historic buildings. 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 He has 2 sons, one is a IT trainer and one is a Warden/ranger.   He goes to bed around 9:00. No CPAP. He gets up to go to the bathroom about 2-3 times a night. He gets up around 4:30-6:00. Feels rested when he wakes up.  Objective:   Vitals: BP (!) 108/56 (BP Location: Left Arm, Patient Position: Sitting, Cuff Size: Large)   Pulse 70   Ht 5' 10.75" (1.797 m)   Wt 147 lb 12.8 oz (67 kg)   SpO2 96%   BMI 20.76 kg/m   Body mass index is 20.76 kg/m.  Advanced Directives 02/15/2018 02/03/2017 12/31/2015 02/03/2014 01/10/2014  Does Patient Have a Medical Advance Directive? Yes Yes Yes No No  Type of Paramedic of [place];Living will - [place];Living will - -  Does patient want to make changes to medical advance directive? No - Patient declined - - - -  Copy of [place] in Chart? No - copy requested - - - -  Would patient like information on creating a medical advance directive? - - - No - patient declined information No - patient declined information    Tobacco Social History   Tobacco Use  Smoking Status Former Smoker  . Packs/day: 0.05  . Years: 20.00  . Pack years: 1.00  . Types: Cigarettes  Smokeless Tobacco Former Systems developer  . Types: Chew  . Quit date: 06/26/1989  Tobacco Comment   chewed tobacco until 89 more than he smoked      Counseling given: Not Answered Comment: chewed tobacco until 89 more than he smoked      Past Medical History:  Diagnosis Date  . GERD  07/21/2006  . Gout   . HYPERLIPIDEMIA 06/17/2009  . HYPERTENSION 07/21/2006  . Raynaud's syndrome 07/21/2006  . Suicide attempt [facility])    Due to Prednisone!  Marland Kitchen VASCULITIS 05/23/2007   Past Surgical History:  Procedure Laterality Date  . HERNIA REPAIR  05/24/19   umbilical hernia  . INGUINAL HERNIA REPAIR     Bil   Family History  Problem Relation Age of Onset  . Hypertension Father   . Prostate cancer Father 17       never operated, not cause of death  . Stroke Father   . Meniere's disease Father   . Early death Paternal Grandfather   . Bladder Cancer Son    Social History   Socioeconomic History  . Marital status: Married    Spouse name: Not on file  . Number of children: Not on file  . Years of education: Not on file  . Highest education level: Not on file  Occupational History  . Not on file  Social Needs  . Financial resource strain: Not on file  . Food insecurity:    Worry: Not on file    Inability: Not on file  . Transportation needs:    Medical: Not on file    Non-medical: Not on file  Tobacco Use  . Smoking status: Former  Smoker    Packs/day: 0.05    Years: 20.00    Pack years: 1.00    Types: Cigarettes  . Smokeless tobacco: Former Systems developer    Types: [place] date: 06/26/1989  . Tobacco comment: chewed tobacco until 89 more than he smoked   Substance and Sexual Activity  . Alcohol use: Yes    Alcohol/week: 2.0 standard drinks    Types: 2 Standard drinks or equivalent per week    Comment: with meals on occasion  . Drug use: No  . Sexual activity: Not Currently  Lifestyle  . Physical activity:    Days per week: Not on file    Minutes per session: Not on file  . Stress: Not on file  Relationships  . Social connections:    Talks on phone: Not on file    Gets together: Not on file    Attends religious service: Not on file    Active member of club or organization: Not on file    Attends meetings of clubs or organizations: Not on file    Relationship  status: Not on file  Other Topics Concern  . Not on file  Social History Narrative   Married 1967. 2 kids. 1 grandchild. 1 greatgrandchilden.       Retired 2003 from [employer]      Hobbies: fishing, shagging, exercise, cruise, 64 GTO convertible    Outpatient Encounter Medications as of 02/15/2018  Medication Sig  . allopurinol (ZYLOPRIM) 100 MG tablet TAKE 1 TABLET BY MOUTH EVERY DAY  . aspirin 81 MG tablet Take 81 mg by mouth daily.    Marland Kitchen atorvastatin (LIPITOR) 40 MG tablet TAKE 1 TABLET BY MOUTH TWICE A WEEK  . calcium carbonate (TUMS - DOSED IN MG ELEMENTAL CALCIUM) 500 MG chewable tablet Chew 1 tablet by mouth as needed for indigestion or heartburn.  . clonazePAM (KLONOPIN) 1 MG tablet TAKE 1/2 TO 1 TABLET BY MOUTH EVERY DAY AS NEEDED  . indomethacin (INDOCIN) 25 MG capsule TAKE ONE CAPSULE BY MOUTH 3 TIMES A DAY AS NEEDED (Patient not taking: Reported on 02/15/2018)  . losartan (COZAAR) 100 MG tablet TAKE 1 TABLET BY MOUTH EVERY DAY   No facility-administered encounter medications on file as of 02/15/2018.     Activities of Daily Living In your present state of health, do you have any difficulty performing the following activities: 02/15/2018 02/15/2018  Hearing? N N  Vision? N N  Difficulty concentrating or making decisions? N N  Walking or climbing stairs? N N  Dressing or bathing? N N  Doing errands, shopping? N N  Preparing Food and eating ? N -  Using the Toilet? N -  In the past six months, have you accidently leaked urine? N -  Do you have problems with loss of bowel control? N -  Managing your Medications? N -  Managing your Finances? N -  Housekeeping or managing your Housekeeping? N -  Some recent data might be hidden    Patient Care Team: [name], MD as PCP - General (Family Medicine) Kennyth Arnold, FNP as Nurse Practitioner (Family Medicine)   Assessment:   This is a routine wellness examination for Kevin Perez.  Exercise Activities  and Dietary recommendations Current Exercise Habits: The patient does not participate in regular exercise at present(Discussed getting back in to [employer]), Exercise limited by: None identified  Breakfast: Frozen Biscuit, 2 pieces of bacon, shredded wheat, blue berry muffin, coffee (black and  decaff)  Lunch: McDonalds, [employer]jangles, German Bologna, drinks water  Dinner: Pimento cheese sandwich, tater tots, chicken tenders, drank water Goals    . Exercise 150 minutes per week (moderate activity)     Will try to get time to get back on your walking schedule     . Gain weight     To maintain your weight  Incorporate more foods or snacks as shakes or peanut butter etc       Fall Risk Fall Risk  02/15/2018 02/15/2018 02/03/2017 12/31/2015 10/28/2015  Falls in the past year? 0 0 No No No  Comment - - - - -  Number falls in past yr: - 0 - - -  Injury with Fall? - 0 - - -     Depression Screen PHQ 2/9 Scores 02/15/2018 02/15/2018 02/03/2017 12/31/2015  PHQ - 2 Score 0 0 0 0    Cognitive Function MMSE - Mini Mental State Exam 02/15/2018 12/31/2015  Not completed: - (No Data)  Orientation to time 5 -  Orientation to Place 5 -  Registration 3 -  Attention/ Calculation 2 -  Recall 2 -  Language- name 2 objects 2 -  Language- repeat 1 -  Language- follow 3 step command 3 -  Language- read & follow direction 1 -  Write a sentence 1 -  Copy design 1 -  Total score 26 -        Immunization History  Administered Date(s) Administered  . Influenza Split 01/04/2011, 12/04/2011  . Influenza Whole 10/31/2009  . Influenza, High Dose Seasonal PF 10/28/2015, 10/17/2017  . Influenza-Unspecified 12/02/2013, 11/02/2014, 11/01/2016, 10/17/2017  . Pneumococcal Conjugate-13 04/30/2014  . Pneumococcal Polysaccharide-23 07/31/2010  . Td 06/17/2009      Screening Tests Health Maintenance  Topic Date Due  . TETANUS/TDAP  06/18/2019  . COLONOSCOPY  07/11/2023  . INFLUENZA VACCINE   Completed  . Hepatitis C Screening  Completed  . PNA vac Low Risk Adult  Completed    Plan:   Follow Up with PCP as Advised  I have personally reviewed and noted the following in the patient's chart:   . Medical and social history . Use of alcohol, tobacco or illicit drugs  . Current medications and supplements . Functional ability and status . Nutritional status . Physical activity . Advanced directives . List of other physicians . Vitals . Screenings to include cognitive, depression, and falls . Referrals and appointments  In addition, I have reviewed and discussed with patient certain preventive protocols, quality metrics, and best practice recommendations. A written personalized care plan for preventive services as well as general preventive health recommendations were provided to patient.     [place], [place]  0/32/1224

## 2018-02-15 NOTE — Patient Instructions (Signed)
Kevin Perez , Thank you for taking time to come for your Medicare Wellness Visit. I appreciate your ongoing commitment to your health goals. Please review the following plan we discussed and let me know if I can assist you in the future.   These are the goals we discussed: Goals    . Exercise 150 minutes per week (moderate activity)     Will try to get time to get back on your walking schedule     . Gain weight     To maintain your weight  Incorporate more foods or snacks as shakes or peanut butter etc       This is a list of the screening recommended for you and due dates:  Health Maintenance  Topic Date Due  . Tetanus Vaccine  06/18/2019  . Colon Cancer Screening  07/11/2023  . Flu Shot  Completed  .  Hepatitis C: One time screening is recommended by Center for Disease Control  (CDC) for  adults born from [birth year] through 1965.   Completed  . Pneumonia vaccines  Completed   Preventive Care for Adults  A healthy lifestyle and preventive care can promote health and wellness. Preventive health guidelines for adults include the following key practices.  . A routine yearly physical is a good way to check with your health care provider about your health and preventive screening. It is a chance to share any concerns and updates on your health and to receive a thorough exam.  . Visit your dentist for a routine exam and preventive care every 6 months. Brush your teeth twice a day and floss once a day. Good oral hygiene prevents tooth decay and gum disease.  . The frequency of eye exams is based on your age, health, family medical history, use  of contact lenses, and other factors. Follow your health care provider's recommendations for frequency of eye exams.  . Eat a healthy diet. Foods like vegetables, fruits, whole grains, low-fat dairy products, and lean protein foods contain the nutrients you need without too many calories. Decrease your intake of foods high in solid fats, added sugars,  and salt. Eat the right amount of calories for you. Get information about a proper diet from your health care provider, if necessary.  . Regular physical exercise is one of the most important things you can do for your health. Most adults should get at least 150 minutes of moderate-intensity exercise (any activity that increases your heart rate and causes you to sweat) each week. In addition, most adults need muscle-strengthening exercises on 2 or more days a week.  Silver Sneakers may be a benefit available to you. To determine eligibility, you may visit the website: www.silversneakers.com or contact program at (856)384-3698 Mon-Fri between 8AM-8PM.   . Maintain a healthy weight. The body mass index (BMI) is a screening tool to identify possible weight problems. It provides an estimate of body fat based on height and weight. Your health care provider can find your BMI and can help you achieve or maintain a healthy weight.   For adults 20 years and older: ? A BMI below 18.5 is considered underweight. ? A BMI of 18.5 to 24.9 is normal. ? A BMI of 25 to 29.9 is considered overweight. ? A BMI of 30 and above is considered obese.   . Maintain normal blood lipids and cholesterol levels by exercising and minimizing your intake of saturated fat. Eat a balanced diet with plenty of fruit and vegetables. Blood tests for  lipids and cholesterol should begin at age 97 and be repeated every 5 years. If your lipid or cholesterol levels are high, you are over 50, or you are at high risk for heart disease, you may need your cholesterol levels checked more frequently. Ongoing high lipid and cholesterol levels should be treated with medicines if diet and exercise are not working.  . If you smoke, find out from your health care provider how to quit. If you do not use tobacco, please do not start.  . If you choose to drink alcohol, please do not consume more than 2 drinks per day. One drink is considered to be 12  ounces (355 mL) of beer, 5 ounces (148 mL) of wine, or 1.5 ounces (44 mL) of liquor.  . If you are 41-60 years old, ask your health care provider if you should take aspirin to prevent strokes.  . Use sunscreen. Apply sunscreen liberally and repeatedly throughout the day. You should seek shade when your shadow is shorter than you. Protect yourself by wearing long sleeves, pants, a wide-brimmed hat, and sunglasses year round, whenever you are outdoors.  . Once a month, do a whole body skin exam, using a mirror to look at the skin on your back. Tell your health care provider of new moles, moles that have irregular borders, moles that are larger than a pencil eraser, or moles that have changed in shape or color.

## 2018-02-15 NOTE — Progress Notes (Signed)
Phone [phone]   Subjective:  Kevin Perez is a 73 y.o. year old very pleasant male patient who presents for/with See problem oriented charting ROS-no recent gout flares.  No chest pain or shortness of breath reported.  No edema.  Past Medical History-  Patient Active Problem List   Diagnosis Date Noted  . History of suicide attempt- while on high dose steroids. avoid prednisone.  01/19/2007    Priority: High  . Testicular swelling, left 04/30/2014    Priority: Medium  . BPH associated with nocturia 04/30/2014    Priority: Medium  . Gout 01/08/2014    Priority: Medium  . Insomnia 10/29/2013    Priority: Medium  . Hyperlipidemia 06/17/2009    Priority: Medium  . History of arteritis 05/23/2007    Priority: Medium  . Essential hypertension 07/21/2006    Priority: Medium  . Former smoker 04/30/2014    Priority: Low  . Prepatellar bursitis of right knee 01/22/2014    Priority: Low  . GERD 07/21/2006    Priority: Low  . Senile cataracts of both eyes 08/22/2013  . 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 Macular degeneration 02/03/2011    Medications- reviewed and updated Current Outpatient Medications  Medication Sig Dispense Refill  . allopurinol (ZYLOPRIM) 100 MG tablet TAKE 1 TABLET BY MOUTH EVERY DAY 90 tablet 1  . aspirin 81 MG tablet Take 81 mg by mouth daily.      Marland Kitchen atorvastatin (LIPITOR) 40 MG tablet TAKE 1 TABLET BY MOUTH TWICE A WEEK 24 tablet 2  . calcium carbonate (TUMS - DOSED IN MG ELEMENTAL CALCIUM) 500 MG chewable tablet Chew 1 tablet by mouth as needed for indigestion or heartburn.    . clonazePAM (KLONOPIN) 1 MG tablet TAKE 1/2 TO 1 TABLET BY MOUTH EVERY DAY AS NEEDED 15 tablet 1  . losartan (COZAAR) 100 MG tablet TAKE 1 TABLET BY MOUTH EVERY DAY 90 tablet 2  . indomethacin (INDOCIN) 25 MG capsule TAKE ONE CAPSULE BY MOUTH 3 TIMES A DAY AS NEEDED (Patient not taking: Reported on 02/15/2018) 60  capsule 1      Objective:  BP 138/60 (BP Location: Left Arm, Patient Position: Sitting, Cuff Size: Normal)   Pulse 63   Temp 97.7 F (36.5 C) (Oral)   Ht 5' 10.75" (1.797 m)   Wt 146 lb 3.2 oz (66.3 kg)   SpO2 97%   BMI 20.54 kg/m  Gen: NAD, resting comfortably CV: RRR no murmurs rubs or gallops Lungs: CTAB no crackles, wheeze, rhonchi Abdomen: soft/nontender/healthy weight Ext: no edema Skin: warm, dry Neuro: grossly normal, moves all extremities GU: Small nodule on posterior portion of left testicle unchanged from prior    Assessment and Plan   % social update- a year past loss of wife now- made holidays hard. Also had to put mom in nursing home.     #Hypertension S: Compliant with losartan 100 mg A/P:  Stable. Continue current medications.        #Hyperlipidemia S: Compliant with atorvastatin 40 mg twice a week.  In past did not tolerate daily dosing A/P: Hopefully LDL remains under 100 at least-update lipids today.   #Gout S: Patient is compliant with allopurinol 100 mg daily.  Has indomethacin on hand if needed. No gout flares since last visit A/P: Stable. Continue current medications. Uric acid looked great last July- will skip for now given no flares  %-2 mm nodule in left testicle- updated exam  today is stable  %-Patient wants PSA through age 70.  Can repeat with next blood work   Future Appointments  Date Time Provider [place]  08/23/2018 10:40 AM [name], MD [facility]   Return in about 6 months (around 08/16/2018).  Lab/Order associations: FASTING Hyperlipidemia, unspecified hyperlipidemia type - Plan: CBC, Lipid panel, Comprehensive metabolic panel, Urinalysis, Urinalysis  Essential hypertension - Plan: Urinalysis, Urinalysis  History of arteritis  Return precautions advised.  [name], MD

## 2018-02-15 NOTE — Progress Notes (Signed)
I have personally reviewed the Medicare Annual Wellness questionnaire and have noted 1. The patient's medical and social history 2. Their use of alcohol, tobacco or illicit drugs 3. Their current medications and supplements 4. The patient's functional ability including ADL's, fall risks, home safety risks and hearing or visual impairment. 5. Diet and physical activities 6. Evidence for depression or mood disorders 7. Reviewed Updated provider list, see scanned forms and [facility] Snapshot.   The patients weight, height, BMI and visual acuity have been recorded in the chart Agree with plan.   Kevin Flaming, MD [place]

## 2018-03-21 ENCOUNTER — Other Ambulatory Visit: Payer: Self-pay | Admitting: Family Medicine

## 2018-04-02 ENCOUNTER — Other Ambulatory Visit: Payer: Self-pay | Admitting: Family Medicine

## 2018-08-23 ENCOUNTER — Ambulatory Visit (INDEPENDENT_AMBULATORY_CARE_PROVIDER_SITE_OTHER): Payer: Medicare Other | Admitting: Family Medicine

## 2018-08-23 ENCOUNTER — Encounter: Payer: Self-pay | Admitting: Family Medicine

## 2018-08-23 ENCOUNTER — Other Ambulatory Visit: Payer: Self-pay

## 2018-08-23 VITALS — BP 130/72 | HR 62 | Temp 98.0°F | Ht 70.75 in | Wt 144.6 lb

## 2018-08-23 DIAGNOSIS — E785 Hyperlipidemia, unspecified: Secondary | ICD-10-CM

## 2018-08-23 DIAGNOSIS — G4700 Insomnia, unspecified: Secondary | ICD-10-CM | POA: Diagnosis not present

## 2018-08-23 DIAGNOSIS — I10 Essential (primary) hypertension: Secondary | ICD-10-CM | POA: Diagnosis not present

## 2018-08-23 DIAGNOSIS — M1A0790 Idiopathic chronic gout, unspecified ankle and foot, without tophus (tophi): Secondary | ICD-10-CM | POA: Diagnosis not present

## 2018-08-23 NOTE — Progress Notes (Signed)
Phone [phone]   Subjective:  Kevin Perez is a 73 y.o. year old very pleasant male patient who presents for/with See problem oriented charting Chief Complaint  Patient presents with  . Follow-up    Fasting today.   . Hyperlipidemia  . Hypertension  . Benign Prostatic Hypertrophy   ROS- No chest pain or shortness of breath. No headache or worsening blurry vision (follows with [facility])   Past Medical History-  Patient Active Problem List   Diagnosis Date Noted  . History of suicide attempt- while on high dose steroids. avoid prednisone.  01/19/2007    Priority: High  . Testicular swelling, left 04/30/2014    Priority: Medium  . BPH associated with nocturia 04/30/2014    Priority: Medium  . Gout 01/08/2014    Priority: Medium  . Insomnia 10/29/2013    Priority: Medium  . Hyperlipidemia 06/17/2009    Priority: Medium  . History of arteritis 05/23/2007    Priority: Medium  . Essential hypertension 07/21/2006    Priority: Medium  . Former smoker 04/30/2014    Priority: Low  . Prepatellar bursitis of right knee 01/22/2014    Priority: Low  . GERD 07/21/2006    Priority: Low  . Senile cataracts of both eyes 08/22/2013  . 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 Macular degeneration 02/03/2011    Medications- reviewed and updated Current Outpatient Medications  Medication Sig Dispense Refill  . allopurinol (ZYLOPRIM) 100 MG tablet TAKE 1 TABLET BY MOUTH EVERY DAY 90 tablet 1  . aspirin 81 MG tablet Take 81 mg by mouth daily.      Marland Kitchen atorvastatin (LIPITOR) 40 MG tablet TAKE 1 TABLET BY MOUTH TWICE A WEEK 24 tablet 2  . calcium carbonate (TUMS - DOSED IN MG ELEMENTAL CALCIUM) 500 MG chewable tablet Chew 1 tablet by mouth as needed for indigestion or heartburn.    . clonazePAM (KLONOPIN) 1 MG tablet TAKE 1/2 TO 1 TABLET BY MOUTH EVERY DAY AS NEEDED 15 tablet 1  . indomethacin (INDOCIN) 25 MG capsule TAKE ONE  CAPSULE BY MOUTH 3 TIMES A DAY AS NEEDED 60 capsule 1  . losartan (COZAAR) 100 MG tablet TAKE 1 TABLET BY MOUTH EVERY DAY 90 tablet 2   No current facility-administered medications for this visit.      Objective:  BP 130/72 (BP Location: Left Arm, Patient Position: Sitting, Cuff Size: Normal)   Pulse 62   Temp 98 F (36.7 C) (Oral)   Ht 5' 10.75" (1.797 m)   Wt 144 lb 9.6 oz (65.6 kg)   SpO2 98%   BMI 20.31 kg/m  Gen: NAD, resting comfortably CV: RRR no murmurs rubs or gallops Lungs: CTAB no crackles, wheeze, rhonchi Abdomen: soft/nontender/nondistended/normal bowel sounds.  Skin: warm, dry Neuro: normal gait and speech    Assessment and Plan   #hypertension S: controlled on Losartan 100 mg daily. Checking BP at home, staying below 140/90-110s over 80s this morning.  Denies HA, dizziness, CP, SOB. Reports occasional visual changes, followed by [facility] q79m.  Not adding salt to foods, eating a lot of canned veggies. Diet has not been as good since his wife passed away. He has been eating processed meat.  BP Readings from Last 3 Encounters:  08/23/18 130/72  02/15/18 (!) 108/56  02/15/18 138/60  A/P: Stable. Continue current medications.   #hyperlipidemia S:  controlled on Taking Atorvastatin 40 mg twice weekly. Also taking Aspirin 81 mg a few  days weekly. Tolerating well, no side effects.  Lab Results  Component Value Date   CHOL 146 02/15/2018   HDL 33.00 (L) 02/15/2018   LDLCALC 95 02/15/2018   LDLDIRECT 94.0 08/10/2017   TRIG 91.0 02/15/2018   CHOLHDL 4 02/15/2018   A/P: Stable. Continue current medications.   # Gout S:Taking Allopurinol  100 mg daily. Managing sx well, no recent flare-ups. Also has Indomethacin prn but takes rarely- none since last visit Lab Results  Component Value Date   LABURIC 4.5 08/10/2017  A/P: Stable. Continue current medications.    # Insomnia S:Taking Clonazepam 1 mg prn at bedtime, rarely.   A/P: last refill 2019- he will call if he  needs refill as uses sparingly   #BPH- with nocturia an dsome frequency but stable- wants to get psa next labs.   Recommended follow up: 6 month follow up Future Appointments  Date Time Provider [place]  02/21/2019  2:00 PM [facility]   Lab/Order associations:   ICD-10-CM   1. Essential hypertension  I10   2. Hyperlipidemia, unspecified hyperlipidemia type  E78.5   3. Idiopathic chronic gout of foot without tophus, unspecified laterality  M1A.0790   4. Insomnia, unspecified type  G47.00      Return precautions advised.  [name], MD

## 2018-08-23 NOTE — Patient Instructions (Addendum)
Health Maintenance Due  Topic Date Due  . INFLUENZA VACCINE  08/19/2018  We should have flu shots available by September. Please strongly consider getting flu shot this year. If you get your flu shot at a pharmacy- please let us know.   No changes today- let us know if you need refill for sleep medicine klonopin/clonazepam. Labs next visit in 27months

## 2018-08-31 ENCOUNTER — Other Ambulatory Visit: Payer: Self-pay | Admitting: Family Medicine

## 2018-09-01 ENCOUNTER — Telehealth: Payer: Self-pay

## 2018-09-01 NOTE — Telephone Encounter (Signed)
Copied from [place] [phone]. Topic: General - Inquiry >> Sep 01, 2018  2:13 PM Kevin Perez, [place] wrote: Reason for CRM: Patient called in stating he wanted to inform PCP he received a shingles vaccination at the [facility]. Please advise. Call back is [phone].

## 2018-09-01 NOTE — Telephone Encounter (Signed)
Please abstract this information-obtain from pharmacy if needed

## 2018-09-04 NOTE — Telephone Encounter (Signed)
Fax received from pharmacy. Updating pt chart now.

## 2018-09-04 NOTE — Telephone Encounter (Signed)
Spoke to pharmacy staff and they stated they will fax over record of vaccine. PT notified of update.

## 2018-09-15 ENCOUNTER — Telehealth: Payer: Self-pay | Admitting: Family Medicine

## 2018-09-15 DIAGNOSIS — Z23 Encounter for immunization: Secondary | ICD-10-CM | POA: Diagnosis not present

## 2018-09-15 NOTE — Telephone Encounter (Signed)
See note  Copied from [place] [phone]. Topic: General - Other >> Sep 15, 2018 10:04 AM Pauline Good wrote: Reason for CRM: FYI pt just got his flu shot

## 2018-09-16 ENCOUNTER — Other Ambulatory Visit: Payer: Self-pay | Admitting: Family Medicine

## 2018-09-18 NOTE — Telephone Encounter (Signed)
Last OV 08/23/18

## 2018-09-18 NOTE — Telephone Encounter (Signed)
Updated pt chart to reflect flu immunization.

## 2018-10-24 DIAGNOSIS — H40033 Anatomical narrow angle, bilateral: Secondary | ICD-10-CM | POA: Diagnosis not present

## 2018-10-24 DIAGNOSIS — H2513 Age-related nuclear cataract, bilateral: Secondary | ICD-10-CM | POA: Diagnosis not present

## 2018-10-24 DIAGNOSIS — H40003 Preglaucoma, unspecified, bilateral: Secondary | ICD-10-CM | POA: Diagnosis not present

## 2018-10-24 DIAGNOSIS — H35713 Central serous chorioretinopathy, bilateral: Secondary | ICD-10-CM | POA: Diagnosis not present

## 2018-11-03 DIAGNOSIS — H40003 Preglaucoma, unspecified, bilateral: Secondary | ICD-10-CM | POA: Diagnosis not present

## 2018-11-03 DIAGNOSIS — H40033 Anatomical narrow angle, bilateral: Secondary | ICD-10-CM | POA: Diagnosis not present

## 2018-11-22 DIAGNOSIS — H40033 Anatomical narrow angle, bilateral: Secondary | ICD-10-CM | POA: Diagnosis not present

## 2018-11-22 DIAGNOSIS — Z9889 Other specified postprocedural states: Secondary | ICD-10-CM | POA: Diagnosis not present

## 2018-11-22 DIAGNOSIS — H40003 Preglaucoma, unspecified, bilateral: Secondary | ICD-10-CM | POA: Diagnosis not present

## 2018-11-22 DIAGNOSIS — Z48810 Encounter for surgical aftercare following surgery on the sense organs: Secondary | ICD-10-CM | POA: Diagnosis not present

## 2018-12-07 DIAGNOSIS — H40033 Anatomical narrow angle, bilateral: Secondary | ICD-10-CM | POA: Diagnosis not present

## 2019-02-02 ENCOUNTER — Encounter: Payer: Self-pay | Admitting: Family Medicine

## 2019-02-02 ENCOUNTER — Other Ambulatory Visit: Payer: Self-pay

## 2019-02-02 ENCOUNTER — Ambulatory Visit (INDEPENDENT_AMBULATORY_CARE_PROVIDER_SITE_OTHER): Payer: Medicare Other | Admitting: Family Medicine

## 2019-02-02 VITALS — BP 120/68 | HR 67 | Temp 98.2°F | Ht 70.75 in | Wt 141.8 lb

## 2019-02-02 DIAGNOSIS — M545 Low back pain, unspecified: Secondary | ICD-10-CM

## 2019-02-02 DIAGNOSIS — I10 Essential (primary) hypertension: Secondary | ICD-10-CM | POA: Diagnosis not present

## 2019-02-02 LAB — BASIC METABOLIC PANEL
BUN: 20 mg/dL (ref 6–23)
CO2: 29 mEq/L (ref 19–32)
Calcium: 9.8 mg/dL (ref 8.4–10.5)
Chloride: 100 mEq/L (ref 96–112)
Creatinine, Ser: 1.19 mg/dL (ref 0.40–1.50)
GFR: 59.81 mL/min — ABNORMAL LOW (ref >60.00)
Glucose, Bld: 100 mg/dL — ABNORMAL HIGH (ref 70–99)
Potassium: 4.8 mEq/L (ref 3.5–5.1)
Sodium: 137 mEq/L (ref 135–145)

## 2019-02-02 LAB — POC URINALSYSI DIPSTICK (AUTOMATED)
Bilirubin, UA: NEGATIVE
Blood, UA: NEGATIVE
Glucose, UA: NEGATIVE
Ketones, UA: NEGATIVE
Leukocytes, UA: NEGATIVE
Nitrite, UA: NEGATIVE
Protein, UA: NEGATIVE
Spec Grav, UA: 1.02 (ref 1.010–1.025)
Urobilinogen, UA: 0.2 E.U./dL
pH, UA: 6 (ref 5.0–8.0)

## 2019-02-02 NOTE — Addendum Note (Signed)
Addended by: Francis Dowse T on: 02/02/2019 11:48 AM   Modules accepted: Orders

## 2019-02-02 NOTE — Progress Notes (Signed)
Phone [phone] In person visit   Subjective:   Kevin Perez is a 74 y.o. year old very pleasant male patient who presents for/with See problem oriented charting Chief Complaint  Patient presents with  . Back Pain    rt sided    This visit occurred during the SARS-CoV-2 public health emergency.  Safety protocols were in place, including screening questions prior to the visit, additional usage of staff PPE, and extensive cleaning of exam room while observing appropriate contact time as indicated for disinfecting solutions.   Past Medical History-  Patient Active Problem List   Diagnosis Date Noted  . History of suicide attempt- while on high dose steroids. avoid prednisone.  01/19/2007    Priority: High  . Testicular swelling, left 04/30/2014    Priority: Medium  . BPH associated with nocturia 04/30/2014    Priority: Medium  . Gout 01/08/2014    Priority: Medium  . Insomnia 10/29/2013    Priority: Medium  . Hyperlipidemia 06/17/2009    Priority: Medium  . History of arteritis 05/23/2007    Priority: Medium  . Essential hypertension 07/21/2006    Priority: Medium  . Former smoker 04/30/2014    Priority: Low  . Prepatellar bursitis of right knee 01/22/2014    Priority: Low  . GERD 07/21/2006    Priority: Low  . Senile cataracts of both eyes 08/22/2013  . 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 Macular degeneration 02/03/2011    Medications- reviewed and updated Current Outpatient Medications  Medication Sig Dispense Refill  . allopurinol (ZYLOPRIM) 100 MG tablet TAKE 1 TABLET BY MOUTH EVERY DAY 90 tablet 1  . atorvastatin (LIPITOR) 40 MG tablet TAKE 1 TABLET BY MOUTH TWICE A WEEK 24 tablet 2  . calcium carbonate (TUMS - DOSED IN MG ELEMENTAL CALCIUM) 500 MG chewable tablet Chew 1 tablet by mouth as needed for indigestion or heartburn.    . clonazePAM (KLONOPIN) 1 MG tablet TAKE 1/2 TO 1 TABLET BY MOUTH EVERY  DAY AS NEEDED 15 tablet 1  . indomethacin (INDOCIN) 25 MG capsule TAKE ONE CAPSULE BY MOUTH 3 TIMES A DAY AS NEEDED 60 capsule 1  . losartan (COZAAR) 100 MG tablet TAKE 1 TABLET BY MOUTH EVERY DAY 90 tablet 2  . aspirin 81 MG tablet Take 81 mg by mouth daily.       No current facility-administered medications for this visit.     Objective:  BP 120/68   Pulse 67   Temp 98.2 F (36.8 C) (Temporal)   Ht 5' 10.75" (1.797 m)   Wt 141 lb 12.8 oz (64.3 kg)   SpO2 97%   BMI 19.92 kg/m  Gen: NAD, resting comfortably CV: RRR no murmurs rubs or gallops Lungs: CTAB no crackles, wheeze, rhonchi Ext: no edema Skin: No obvious rash on back GU: Largely normal and specifically without testicular tenderness Back - Normal skin, Spine with normal alignment and no deformity.  No tenderness to vertebral process palpation.  Paraspinous muscles are  tender and with spasm.   Negative Straight leg raise.  Neuro- no saddle anesthesia, 5/5 strength lower extremities    Assessment and Plan   # Health maintenance- may be able to get covid vaccine through health department.  #Left low back pain S: Patient started with left low back pain about a week ago.  He rates pain is mild to moderate.  Is very tolerable if he takes Tylenol 500 mg-occasionally has to take  at 1000 mg but so far has limited to once or twice a day.  Heat or ice also helps.  He has noted some radiation of the pain around his left side and into the left groin and this made him worried about kidney stones.  No blood in urine was reported.  No fever chills reported.  No falls or injuries.  Does bend over using back more than his legs to help his dog and wonders if this could be a point of stress.  He does get some pain into his left leg trying to get out of the car at times  Nocturia a few times a night but not worsening. No burning with peeing (slight burning with peeing at start of stream for sometime and has not been sexually active) or new  frequent urination.  A/P: Patient presented today worried about kidney stones given radiation around to his groin-with mild to moderate pain that is rather constant and does not come in waves I suspect musculoskeletal cause.  We will get a urinalysis and if any blood in the urine will get a CT noncontrast study of abdomen pelvis to look for kidney stones.  We will also check renal function today.  In regards to musculoskeletal pain-I told him Tylenol was reasonable to take and would also continue heat or ice whichever feels better at least 3 times a day.  Discussed if not improving by his February follow-up we could consider imaging of low back.  If he has worsening symptoms before then he should certainly let me know.   #Hypertension S: Compliant with losartan 100 mg A/P:  Stable. Continue current medications.    Recommended follow up: Future Appointments  Date Time Provider [place]  02/21/2019  8:00 AM [facility] HEALTH COACH [facility]  02/26/2019 10:40 AM [name], MD [facility]    Lab/Order associations:   ICD-10-CM   1. Acute left-sided low back pain without sciatica  M54.5 POC UA    Basic metabolic panel  2. Essential hypertension  I10    Return precautions advised.  [name], MD

## 2019-02-02 NOTE — Patient Instructions (Addendum)
Please stop by lab before you go If you do not have mychart- we will call you about results within 5 business days of [place] receiving them.  If you have mychart- we will send your results within 3 business days of [place] receiving them.  If abnormal or we want to clarify a result, we will call or mychart you to make sure you receive the message.  If you have questions or concerns or don't hear within 5-7 days, please send [place] a message or call us.   We are going to check urine to make sure no blood- if blood in urine could be a sign of kidney stones and we would get CT scan  Also checking kidney function today  I more strongly suspect this is a strain of low back or slight flare up of possible arthritis. Tylenol is perfectly fine. Can take up to 1000mg  three times a day/24 hour period but need to separate doses by at least 6 hours and once again max 3000mg  per day. Continue ice or heat at least 3x a day- whichever feels better  If not improving by follow up pin February we can consider x-ray or see [place] back sooner if symptoms worsen   Recommended follow up: see [place] for February visit

## 2019-02-14 DIAGNOSIS — H2513 Age-related nuclear cataract, bilateral: Secondary | ICD-10-CM | POA: Diagnosis not present

## 2019-02-14 DIAGNOSIS — H40003 Preglaucoma, unspecified, bilateral: Secondary | ICD-10-CM | POA: Diagnosis not present

## 2019-02-14 DIAGNOSIS — H40033 Anatomical narrow angle, bilateral: Secondary | ICD-10-CM | POA: Diagnosis not present

## 2019-02-14 DIAGNOSIS — H35713 Central serous chorioretinopathy, bilateral: Secondary | ICD-10-CM | POA: Diagnosis not present

## 2019-02-21 ENCOUNTER — Ambulatory Visit: Payer: Medicare Other

## 2019-02-26 ENCOUNTER — Ambulatory Visit (INDEPENDENT_AMBULATORY_CARE_PROVIDER_SITE_OTHER): Payer: Medicare Other

## 2019-02-26 ENCOUNTER — Encounter: Payer: Self-pay | Admitting: Family Medicine

## 2019-02-26 ENCOUNTER — Other Ambulatory Visit: Payer: Self-pay

## 2019-02-26 ENCOUNTER — Ambulatory Visit (INDEPENDENT_AMBULATORY_CARE_PROVIDER_SITE_OTHER): Payer: Medicare Other | Admitting: Family Medicine

## 2019-02-26 VITALS — BP 128/78 | HR 62 | Temp 98.0°F | Ht 71.0 in | Wt 138.6 lb

## 2019-02-26 DIAGNOSIS — Z0000 Encounter for general adult medical examination without abnormal findings: Secondary | ICD-10-CM

## 2019-02-26 DIAGNOSIS — I10 Essential (primary) hypertension: Secondary | ICD-10-CM | POA: Diagnosis not present

## 2019-02-26 DIAGNOSIS — E785 Hyperlipidemia, unspecified: Secondary | ICD-10-CM

## 2019-02-26 DIAGNOSIS — M1A0790 Idiopathic chronic gout, unspecified ankle and foot, without tophus (tophi): Secondary | ICD-10-CM

## 2019-02-26 DIAGNOSIS — R351 Nocturia: Secondary | ICD-10-CM | POA: Diagnosis not present

## 2019-02-26 LAB — LIPID PANEL
Cholesterol: 151 mg/dL (ref 0–200)
HDL: 31.3 mg/dL — ABNORMAL LOW (ref >39.00)
LDL Cholesterol: 103 mg/dL — ABNORMAL HIGH (ref 0–99)
NonHDL: 119.36
Total CHOL/HDL Ratio: 5
Triglycerides: 81 mg/dL (ref 0.0–149.0)
VLDL: 16.2 mg/dL (ref 0.0–40.0)

## 2019-02-26 LAB — COMPREHENSIVE METABOLIC PANEL
ALT: 9 U/L (ref 0–53)
AST: 14 U/L (ref 0–37)
Albumin: 4.2 g/dL (ref 3.5–5.2)
Alkaline Phosphatase: 122 U/L — ABNORMAL HIGH (ref 39–117)
BUN: 19 mg/dL (ref 6–23)
CO2: 28 mEq/L (ref 19–32)
Calcium: 9.8 mg/dL (ref 8.4–10.5)
Chloride: 100 mEq/L (ref 96–112)
Creatinine, Ser: 1.15 mg/dL (ref 0.40–1.50)
GFR: 62.2 mL/min (ref >60.00)
Glucose, Bld: 92 mg/dL (ref 70–99)
Potassium: 4.4 mEq/L (ref 3.5–5.1)
Sodium: 136 mEq/L (ref 135–145)
Total Bilirubin: 0.7 mg/dL (ref 0.2–1.2)
Total Protein: 8 g/dL (ref 6.0–8.3)

## 2019-02-26 LAB — CBC WITH DIFFERENTIAL/PLATELET
Basophils Absolute: 0 10*3/uL (ref 0.0–0.1)
Basophils Relative: 0.6 % (ref 0.0–3.0)
Eosinophils Absolute: 0.2 10*3/uL (ref 0.0–0.7)
Eosinophils Relative: 2.1 % (ref 0.0–5.0)
HCT: 41.8 % (ref 39.0–52.0)
Hemoglobin: 13.7 g/dL (ref 13.0–17.0)
Lymphocytes Relative: 23.6 % (ref 12.0–46.0)
Lymphs Abs: 1.7 10*3/uL (ref 0.7–4.0)
MCHC: 32.9 g/dL (ref 30.0–36.0)
MCV: 90.4 fl (ref 78.0–100.0)
Monocytes Absolute: 0.6 10*3/uL (ref 0.1–1.0)
Monocytes Relative: 8.2 % (ref 3.0–12.0)
Neutro Abs: 4.7 10*3/uL (ref 1.4–7.7)
Neutrophils Relative %: 65.5 % (ref 43.0–77.0)
Platelets: 199 10*3/uL (ref 150.0–400.0)
RBC: 4.62 Mil/uL (ref 4.22–5.81)
RDW: 13.7 % (ref 11.5–15.5)
WBC: 7.2 10*3/uL (ref 4.0–10.5)

## 2019-02-26 LAB — URIC ACID: Uric Acid, Serum: 4.4 mg/dL (ref 4.0–7.8)

## 2019-02-26 LAB — PSA: PSA: 2.98 ng/mL (ref 0.10–4.00)

## 2019-02-26 MED ORDER — INDOMETHACIN 25 MG PO CAPS: ORAL_CAPSULE | ORAL | 1 refills | Status: DC

## 2019-02-26 MED ORDER — ALLOPURINOL 100 MG PO TABS: 100.0000 mg | ORAL_TABLET | Freq: Every day | ORAL | 3 refills | Status: AC

## 2019-02-26 MED ORDER — LOSARTAN POTASSIUM 100 MG PO TABS: 100.0000 mg | ORAL_TABLET | Freq: Every day | ORAL | 3 refills | Status: DC

## 2019-02-26 MED ORDER — ATORVASTATIN CALCIUM 40 MG PO TABS: ORAL_TABLET | ORAL | 3 refills | Status: DC

## 2019-02-26 NOTE — Progress Notes (Addendum)
Phone [phone] In person visit   Subjective:   Kevin Perez is a 74 y.o. year old very pleasant male patient who presents for/with See problem oriented charting Chief Complaint  Patient presents with  . Follow-up  . Sciatica    doing better     This visit occurred during the SARS-CoV-2 public health emergency.  Safety protocols were in place, including screening questions prior to the visit, additional usage of staff PPE, and extensive cleaning of exam room while observing appropriate contact time as indicated for disinfecting solutions.   Past Medical History-  Patient Active Problem List   Diagnosis Date Noted  . History of suicide attempt- while on high dose steroids. avoid prednisone.  01/19/2007    Priority: High  . Testicular swelling, left 04/30/2014    Priority: Medium  . BPH associated with nocturia 04/30/2014    Priority: Medium  . Gout 01/08/2014    Priority: Medium  . Insomnia 10/29/2013    Priority: Medium  . Hyperlipidemia 06/17/2009    Priority: Medium  . History of arteritis 05/23/2007    Priority: Medium  . Essential hypertension 07/21/2006    Priority: Medium  . Former smoker 04/30/2014    Priority: Low  . Prepatellar bursitis of right knee 01/22/2014    Priority: Low  . GERD 07/21/2006    Priority: Low  . Senile cataracts of both eyes 08/22/2013  . 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 Macular degeneration 02/03/2011    Medications- reviewed and updated Current Outpatient Medications  Medication Sig Dispense Refill  . allopurinol (ZYLOPRIM) 100 MG tablet Take 1 tablet (100 mg total) by mouth daily. 90 tablet 3  . aspirin 81 MG tablet Take 81 mg by mouth daily.      Marland Kitchen atorvastatin (LIPITOR) 40 MG tablet One tab twice a week. 26 tablet 3  . calcium carbonate (TUMS - DOSED IN MG ELEMENTAL CALCIUM) 500 MG chewable tablet Chew 1 tablet by mouth as needed for indigestion or heartburn.    .  clonazePAM (KLONOPIN) 1 MG tablet TAKE 1/2 TO 1 TABLET BY MOUTH EVERY DAY AS NEEDED 15 tablet 1  . indomethacin (INDOCIN) 25 MG capsule TAKE ONE CAPSULE BY MOUTH 3 TIMES A DAY AS NEEDED 60 capsule 1  . losartan (COZAAR) 100 MG tablet Take 1 tablet (100 mg total) by mouth daily. 90 tablet 3   No current facility-administered medications for this visit.     Objective:  BP 128/78   Pulse 62   Temp 98 F (36.7 C) (Temporal)   Ht 5\' 11"  (1.803 m)   Wt 138 lb 9.6 oz (62.9 kg)   SpO2 98%   BMI 19.33 kg/m  Gen: NAD, resting comfortably CV: RRR no murmurs rubs or gallops Lungs: CTAB no crackles, wheeze, rhonchi Ext: no edema Skin: warm, dry Neuro:normal gait Msk: when stands up points to left low back as source of mild pain     Assessment and Plan  #Left low back pain S:has had improvement. Occasionally radiates into groin.  Has not taken any over the counter medications on over a week. Was able to walk on treadmill yesterday. Down from a 7 to a 3 at this point. Pain overall has been about a month  A/P: Pain is improving- as long as continues to improve- will monitor. If it worsens at this point or if it lasts more than another few weeks- he will call me and we will set  up an x-ray of the low back- we discussed at end of visit and opted to actually refer to sports medicine if not continually improving in 2-3 more weeks  Patient asks about checking PSA level- has some urinary hesitancy, nocturia 3x a night . With prior elevated # even though it came back down- I think its reasonable to check Lab Results  Component Value Date   PSA 2.80 10/12/2017   PSA 4.63 (H) 08/10/2017   PSA 2.17 04/30/2014    #Hypertension S: Compliant with losartan 100 mg. He does check at home. This morning was 128/70.  Denies any chest pain, palpitation, or SOB.   BP Readings from Last 3 Encounters:  02/26/19 128/78  02/26/19 128/78  02/02/19 120/68   A/P: Excellent control-continue current medications     #Hyperlipidemia S: Compliant with atorvastatin 40 mg twice a week. Thinks he may be having little joint pain.  Lab Results  Component Value Date   CHOL 146 02/15/2018   HDL 33.00 (L) 02/15/2018   LDLCALC 95 02/15/2018   LDLDIRECT 94.0 08/10/2017   TRIG 91.0 02/15/2018   CHOLHDL 4 02/15/2018   A/P: We discussed newer more aggressive LDL goal of 70 or less-patient would like to continue current dose of medicine as long as for primary prevention only- he wonders if myalgias are contributed to by medicine so wants to hold off unless LDL over 100    #Gout S: Patient is compliant with allopurinol 100 mg daily.  Has indomethacin on hand if needed. Has not had any flair ups. Doing well with Allopurinol.   Lab Results  Component Value Date   LABURIC 4.5 08/10/2017   A/P: Excellent control-continue current medication. Update uric acid level today  #Former smoker-had AAA scan with [facility] April 30, 2014  # anxiety- sparing clonazepam- last in  Refill 2019 #15  Recommended follow up: Return in about 6 months (around 08/26/2019) for follow up- or sooner if needed.  Lab/Order associations:   ICD-10-CM   1. Hyperlipidemia, unspecified hyperlipidemia type  E78.5 CBC with Differential/Platelet    Comprehensive metabolic panel    Lipid panel  2. Nocturia  R35.1 PSA  3. Idiopathic chronic gout of foot without tophus, unspecified laterality  M1A.0790 Uric acid  4. Essential hypertension  I10 CBC with Differential/Platelet    Comprehensive metabolic panel    Lipid panel    Meds ordered this encounter  Medications  . allopurinol (ZYLOPRIM) 100 MG tablet    Sig: Take 1 tablet (100 mg total) by mouth daily.    Dispense:  90 tablet    Refill:  3  . atorvastatin (LIPITOR) 40 MG tablet    Sig: One tab twice a week.    Dispense:  26 tablet    Refill:  3  . indomethacin (INDOCIN) 25 MG capsule    Sig: TAKE ONE CAPSULE BY MOUTH 3 TIMES A DAY AS NEEDED    Dispense:  60 capsule    Refill:  1  .  losartan (COZAAR) 100 MG tablet    Sig: Take 1 tablet (100 mg total) by mouth daily.    Dispense:  90 tablet    Refill:  3   Return precautions advised.  [name], MD

## 2019-02-26 NOTE — Patient Instructions (Addendum)
Pain is improving- as long as continues to improve- will monitor. If it worsens at this point or if it lasts more than another few weeks- he will call me and we will set up an x-ray of the low back   Please stop by lab before you go If you do not have mychart- we will call you about results within 5 business days of [place] receiving them.  If you have mychart- we will send your results within 3 business days of [place] receiving them.  If abnormal or we want to clarify a result, we will call or mychart you to make sure you receive the message.  If you have questions or concerns or don't hear within 5-7 days, please send [place] a message or call us.   Recommended follow up: Return in about 6 months (around 08/26/2019) for follow up- or sooner if needed.

## 2019-02-26 NOTE — Patient Instructions (Signed)
Kevin Perez , Thank you for taking time to come for your Medicare Wellness Visit. I appreciate your ongoing commitment to your health goals. Please review the following plan we discussed and let me know if I can assist you in the future.   Screening recommendations/referrals: Colorectal Screening: up to date; last colonoscopy 07/10/13  Vision and Dental Exams: Recommended annual ophthalmology exams for early detection of glaucoma and other disorders of the eye Recommended annual dental exams for proper oral hygiene  Vaccinations: Influenza vaccine: completed 09/15/18 Pneumococcal vaccine: up to date; last 04/30/14 Tdap vaccine: up to date; last 06/17/09(recommended every 10 years) Shingles vaccine: Shingrix completed   Advanced directives: Please bring a copy of your POA (Power of [place]) and/or Living Will to your next appointment.  Goals: Recommend to drink at least 6-8 8oz glasses of water per day and consume a balanced diet rich in fresh fruits and vegetables.   Next appointment: Please schedule your Annual Wellness Visit with your Nurse Health Advisor in one year.  Preventive Care 21 Years and Older, Male Preventive care refers to lifestyle choices and visits with your health care provider that can promote health and wellness. What does preventive care include?  A yearly physical exam. This is also called an annual well check.  Dental exams once or twice a year.  Routine eye exams. Ask your health care provider how often you should have your eyes checked.  Personal lifestyle choices, including:  Daily care of your teeth and gums.  Regular physical activity.  Eating a healthy diet.  Avoiding tobacco and drug use.  Limiting alcohol use.  Practicing safe sex.  Taking low doses of aspirin every day if recommended by your health care provider..  Taking vitamin and mineral supplements as recommended by your health care provider. What happens during an annual well  check? The services and screenings done by your health care provider during your annual well check will depend on your age, overall health, lifestyle risk factors, and family history of disease. Counseling  Your health care provider may ask you questions about your:  Alcohol use.  Tobacco use.  Drug use.  Emotional well-being.  Home and relationship well-being.  Sexual activity.  Eating habits.  History of falls.  Memory and ability to understand (cognition).  Work and work Statistician. Screening  You may have the following tests or measurements:  Height, weight, and BMI.  Blood pressure.  Lipid and cholesterol levels. These may be checked every 5 years, or more frequently if you are over 3 years old.  Skin check.  Lung cancer screening. You may have this screening every year starting at age 18 if you have a 30-pack-year history of smoking and currently smoke or have quit within the past 15 years.  Fecal occult blood test (FOBT) of the stool. You may have this test every year starting at age 61.  Flexible sigmoidoscopy or colonoscopy. You may have a sigmoidoscopy every 5 years or a colonoscopy every 10 years starting at age 72.  Prostate cancer screening. Recommendations will vary depending on your family history and other risks.  Hepatitis C blood test.  Hepatitis B blood test.  Sexually transmitted disease (STD) testing.  Diabetes screening. This is done by checking your blood sugar (glucose) after you have not eaten for a while (fasting). You may have this done every 1-3 years.  Abdominal aortic aneurysm (AAA) screening. You may need this if you are a current or former smoker.  Osteoporosis. You may be  screened starting at age 71 if you are at high risk. Talk with your health care provider about your test results, treatment options, and if necessary, the need for more tests. Vaccines  Your health care provider may recommend certain vaccines, such  as:  Influenza vaccine. This is recommended every year.  Tetanus, diphtheria, and acellular pertussis (Tdap, Td) vaccine. You may need a Td booster every 10 years.  Zoster vaccine. You may need this after age 25.  Pneumococcal 13-valent conjugate (PCV13) vaccine. One dose is recommended after age 51.  Pneumococcal polysaccharide (PPSV23) vaccine. One dose is recommended after age 34. Talk to your health care provider about which screenings and vaccines you need and how often you need them. This information is not intended to replace advice given to you by your health care provider. Make sure you discuss any questions you have with your health care provider. Document Released: 01/31/2015 Document Revised: 09/24/2015 Document Reviewed: 11/05/2014 Elsevier Interactive Patient Education  2017 [employer] Prevention in the Home Falls can cause injuries. They can happen to people of all ages. There are many things you can do to make your home safe and to help prevent falls. What can I do on the outside of my home?  Regularly fix the edges of walkways and driveways and fix any cracks.  Remove anything that might make you trip as you walk through a door, such as a raised step or threshold.  Trim any bushes or trees on the path to your home.  Use bright outdoor lighting.  Clear any walking paths of anything that might make someone trip, such as rocks or tools.  Regularly check to see if handrails are loose or broken. Make sure that both sides of any steps have handrails.  Any raised decks and porches should have guardrails on the edges.  Have any leaves, snow, or ice cleared regularly.  Use sand or salt on walking paths during winter.  Clean up any spills in your garage right away. This includes oil or grease spills. What can I do in the bathroom?  Use night lights.  Install grab bars by the toilet and in the tub and shower. Do not use towel bars as grab bars.  Use  non-skid mats or decals in the tub or shower.  If you need to sit down in the shower, use a plastic, non-slip stool.  Keep the floor dry. Clean up any water that spills on the floor as soon as it happens.  Remove soap buildup in the tub or shower regularly.  Attach bath mats securely with double-sided non-slip rug tape.  Do not have throw rugs and other things on the floor that can make you trip. What can I do in the bedroom?  Use night lights.  Make sure that you have a light by your bed that is easy to reach.  Do not use any sheets or blankets that are too big for your bed. They should not hang down onto the floor.  Have a firm chair that has side arms. You can use this for support while you get dressed.  Do not have throw rugs and other things on the floor that can make you trip. What can I do in the kitchen?  Clean up any spills right away.  Avoid walking on wet floors.  Keep items that you use a lot in easy-to-reach places.  If you need to reach something above you, use a strong step stool that has a  grab bar.  Keep electrical cords out of the way.  Do not use floor polish or wax that makes floors slippery. If you must use wax, use non-skid floor wax.  Do not have throw rugs and other things on the floor that can make you trip. What can I do with my stairs?  Do not leave any items on the stairs.  Make sure that there are handrails on both sides of the stairs and use them. Fix handrails that are broken or loose. Make sure that handrails are as long as the stairways.  Check any carpeting to make sure that it is firmly attached to the stairs. Fix any carpet that is loose or worn.  Avoid having throw rugs at the top or bottom of the stairs. If you do have throw rugs, attach them to the floor with carpet tape.  Make sure that you have a light switch at the top of the stairs and the bottom of the stairs. If you do not have them, ask someone to add them for you. What  else can I do to help prevent falls?  Wear shoes that:  Do not have high heels.  Have rubber bottoms.  Are comfortable and fit you well.  Are closed at the toe. Do not wear sandals.  If you use a stepladder:  Make sure that it is fully opened. Do not climb a closed stepladder.  Make sure that both sides of the stepladder are locked into place.  Ask someone to hold it for you, if possible.  Clearly mark and make sure that you can see:  Any grab bars or handrails.  First and last steps.  Where the edge of each step is.  Use tools that help you move around (mobility aids) if they are needed. These include:  Canes.  Walkers.  Scooters.  Crutches.  Turn on the lights when you go into a dark area. Replace any light bulbs as soon as they burn out.  Set up your furniture so you have a clear path. Avoid moving your furniture around.  If any of your floors are uneven, fix them.  If there are any pets around you, be aware of where they are.  Review your medicines with your doctor. Some medicines can make you feel dizzy. This can increase your chance of falling. Ask your doctor what other things that you can do to help prevent falls. This information is not intended to replace advice given to you by your health care provider. Make sure you discuss any questions you have with your health care provider. Document Released: 10/31/2008 Document Revised: 06/12/2015 Document Reviewed: 02/08/2014 Elsevier Interactive Patient Education  2017 [employer].

## 2019-02-26 NOTE — Progress Notes (Signed)
Subjective:   Kevin Perez is a 74 y.o. male who presents for Medicare Annual/Subsequent preventive examination.  Review of Systems:   Cardiac Risk Factors include: advanced age (>34men, >47 women);hypertension;male gender;dyslipidemia    Objective:    Vitals: BP 128/78   Pulse 62   Temp 98 F (36.7 C) (Temporal)   Ht 5\' 11"  (1.803 m)   Wt 138 lb 9.6 oz (62.9 kg)   SpO2 98%   BMI 19.33 kg/m   Body mass index is 19.33 kg/m.  Advanced Directives 02/26/2019 02/15/2018 02/03/2017 12/31/2015 02/03/2014 01/10/2014  Does Patient Have a Medical Advance Directive? Yes Yes Yes Yes No No  Type of Advance Directive Living will;Healthcare Power of [place];Living will - [place];Living will - -  Does patient want to make changes to medical advance directive? No - Patient declined No - Patient declined - - - -  Copy of [place] in Chart? No - copy requested No - copy requested - - - -  Would patient like information on creating a medical advance directive? - - - - No - patient declined information No - patient declined information    Tobacco Social History   Tobacco Use  Smoking Status Former Smoker  . Packs/day: 0.05  . Years: 20.00  . Pack years: 1.00  . Types: Cigarettes  Smokeless Tobacco Former Systems developer  . Types: Chew  . Quit date: 06/26/1989  Tobacco Comment   chewed tobacco until 89 more than he smoked      Counseling given: Not Answered Comment: chewed tobacco until 89 more than he smoked    Clinical Intake:  Pre-visit preparation completed: Yes  Pain : No/denies pain  Diabetes: No  How often do you need to have someone help you when you read instructions, pamphlets, or other written materials from your doctor or pharmacy?: 1 - Never  Interpreter Needed?: No  Information entered by :: Denman George LPN  Past Medical History:  Diagnosis Date  . GERD 07/21/2006  . Gout   . HYPERLIPIDEMIA  06/17/2009  . HYPERTENSION 07/21/2006  . Raynaud's syndrome 07/21/2006  . Suicide attempt [facility])    Due to Prednisone!  Marland Kitchen VASCULITIS 05/23/2007   Past Surgical History:  Procedure Laterality Date  . HERNIA REPAIR  [id number]   umbilical hernia  . INGUINAL HERNIA REPAIR     Bil   Family History  Problem Relation Age of Onset  . Other Mother        passed age 70  . Hypertension Father   . Prostate cancer Father 49       never operated, not cause of death  . Stroke Father   . Meniere's disease Father   . Early death Paternal Grandfather   . Bladder Cancer Son    Social History   Socioeconomic History  . Marital status: Married    Spouse name: Not on file  . Number of children: 2  . Years of education: Not on file  . Highest education level: Not on file  Occupational History  . Occupation: Retired   Tobacco Use  . Smoking status: Former Smoker    Packs/day: 0.05    Years: 20.00    Pack years: 1.00    Types: Cigarettes  . Smokeless tobacco: Former Systems developer    Types: [place] date: 06/26/1989  . Tobacco comment: chewed tobacco until 89 more than he smoked   Substance and Sexual  Activity  . Alcohol use: Yes    Alcohol/week: 2.0 standard drinks    Types: 2 Standard drinks or equivalent per week    Comment: with meals on occasion  . Drug use: No  . Sexual activity: Not Currently  Other Topics Concern  . Not on file  Social History Narrative   Widowed 2019- was Married 1967. 2 kids. 1 grandchild. 1 greatgrandchilden.       Retired 2003 from [employer]      Hobbies: fishing, shagging, exercise, cruise, 18 GTO convertible   Social Determinants of Health   Financial Resource Strain:   . Difficulty of Paying Living Expenses: Not on file  Food Insecurity:   . Worried About Charity fundraiser in the Last Year: Not on file  . Ran Out of Food in the Last Year: Not on file  Transportation Needs:   . Lack of Transportation (Medical): Not on file  . Lack of  Transportation (Non-Medical): Not on file  Physical Activity:   . Days of Exercise per Week: Not on file  . Minutes of Exercise per Session: Not on file  Stress:   . Feeling of Stress : Not on file  Social Connections:   . Frequency of Communication with Friends and Family: Not on file  . Frequency of Social Gatherings with Friends and Family: Not on file  . Attends Religious Services: Not on file  . Active Member of Clubs or Organizations: Not on file  . Attends Archivist Meetings: Not on file  . Marital Status: Not on file    Outpatient Encounter Medications as of 02/26/2019  Medication Sig  . allopurinol (ZYLOPRIM) 100 MG tablet TAKE 1 TABLET BY MOUTH EVERY DAY  . aspirin 81 MG tablet Take 81 mg by mouth daily.    Marland Kitchen atorvastatin (LIPITOR) 40 MG tablet TAKE 1 TABLET BY MOUTH TWICE A WEEK  . calcium carbonate (TUMS - DOSED IN MG ELEMENTAL CALCIUM) 500 MG chewable tablet Chew 1 tablet by mouth as needed for indigestion or heartburn.  . clonazePAM (KLONOPIN) 1 MG tablet TAKE 1/2 TO 1 TABLET BY MOUTH EVERY DAY AS NEEDED  . indomethacin (INDOCIN) 25 MG capsule TAKE ONE CAPSULE BY MOUTH 3 TIMES A DAY AS NEEDED  . losartan (COZAAR) 100 MG tablet TAKE 1 TABLET BY MOUTH EVERY DAY   No facility-administered encounter medications on file as of 02/26/2019.    Activities of Daily Living In your present state of health, do you have any difficulty performing the following activities: 02/26/2019  Hearing? N  Vision? N  Difficulty concentrating or making decisions? N  Walking or climbing stairs? N  Dressing or bathing? N  Doing errands, shopping? N  Preparing Food and eating ? N  Using the Toilet? N  In the past six months, have you accidently leaked urine? N  Do you have problems with loss of bowel control? N  Managing your Medications? N  Managing your Finances? N  Housekeeping or managing your Housekeeping? N  Some recent data might be hidden    Patient Care Team: [name], MD as PCP - General (Family Medicine) Kennyth Arnold, FNP as Nurse Practitioner (Family Medicine) Bond, Tracie Harrier, MD as Consulting Physician (Ophthalmology)   Assessment:   This is a routine wellness examination for [place].  Exercise Activities and Dietary recommendations Current Exercise Habits: Home exercise routine, Type of exercise: walking;Other - see comments(works at fire department), Time (Minutes): 45, Frequency (Times/Week): 5, Weekly  Exercise (Minutes/Week): 225, Intensity: Moderate  Goals    . Exercise 150 minutes per week (moderate activity)     Will try to get time to get back on your walking schedule     . Gain weight     To maintain your weight  Incorporate more foods or snacks as shakes or peanut butter etc       Fall Risk Fall Risk  02/26/2019 02/02/2019 08/23/2018 02/15/2018 02/15/2018  Falls in the past year? 0 0 0 0 0  Comment - - - - -  Number falls in past yr: 0 - 0 - 0  Injury with Fall? 0 - 0 - 0  Follow up Falls evaluation completed;Education provided;Falls prevention discussed - - - -   Is the patient's home free of loose throw rugs in walkways, pet beds, electrical cords, etc?   yes      Grab bars in the bathroom? yes      Handrails on the stairs?   yes      Adequate lighting?   yes  Timed Get Up and Go Performed: completed and within normal timeframe; no gait abnormalities noted   Depression Screen PHQ 2/9 Scores 02/26/2019 08/23/2018 02/15/2018 02/15/2018  PHQ - 2 Score 0 0 0 0    Cognitive Function MMSE - Mini Mental State Exam 02/15/2018 12/31/2015  Not completed: - (No Data)  Orientation to time 5 -  Orientation to Place 5 -  Registration 3 -  Attention/ Calculation 2 -  Recall 2 -  Language- name 2 objects 2 -  Language- repeat 1 -  Language- follow 3 step command 3 -  Language- read & follow direction 1 -  Write a sentence 1 -  Copy design 1 -  Total score 26 -     6CIT Screen 02/26/2019  What Year? 0 points  What month? 0  points  What time? 0 points  Count back from 20 0 points  Months in reverse 0 points  Repeat phrase 2 points  Total Score 2    Immunization History  Administered Date(s) Administered  . Influenza Split 01/04/2011, 12/04/2011  . Influenza Whole 10/31/2009  . Influenza, High Dose Seasonal PF 10/28/2015, 10/17/2017  . Influenza-Unspecified 12/02/2013, 11/02/2014, 11/01/2016, 10/17/2017  . Pneumococcal Conjugate-13 04/30/2014  . Pneumococcal Polysaccharide-23 07/31/2010  . Td 06/17/2009  . Zoster Recombinat (Shingrix) 09/01/2018, 12/05/2018    Qualifies for Shingles Vaccine? Shingrix completed   Screening Tests Health Maintenance  Topic Date Due  . TETANUS/TDAP  06/18/2019  . COLONOSCOPY  07/11/2023  . INFLUENZA VACCINE  Completed  . Hepatitis C Screening  Completed  . PNA vac Low Risk Adult  Completed   Cancer Screenings: Lung: Low Dose CT Chest recommended if Age 45-80 years, 30 pack-year currently smoking OR have quit w/in 15years. Patient does not qualify. Colorectal: colonoscopy 07/10/13; repeat in 10 years      Plan:  I have personally reviewed and addressed the Medicare Annual Wellness questionnaire and have noted the following in the patient's chart:  A. Medical and social history B. Use of alcohol, tobacco or illicit drugs  C. Current medications and supplements D. Functional ability and status E.  Nutritional status F.  Physical activity G. Advance directives H. List of other physicians I.  Hospitalizations, surgeries, and ER visits in previous 12 months J.  [place] such as hearing and vision if needed, cognitive and depression L. Referrals, records requested, and appointments- none   In addition, I have  reviewed and discussed with patient certain preventive protocols, quality metrics, and best practice recommendations. A written personalized care plan for preventive services as well as general preventive health recommendations were provided to  patient.   Signed,  Denman George, LPN  Nurse Health Advisor   Nurse Notes: Patient has had 1st Covid vaccine

## 2019-02-27 ENCOUNTER — Other Ambulatory Visit: Payer: Self-pay

## 2019-02-27 MED ORDER — ATORVASTATIN CALCIUM 40 MG PO TABS: 40.0000 mg | ORAL_TABLET | ORAL | 3 refills | Status: DC

## 2019-03-20 ENCOUNTER — Other Ambulatory Visit: Payer: Medicare Other

## 2019-03-20 ENCOUNTER — Ambulatory Visit (INDEPENDENT_AMBULATORY_CARE_PROVIDER_SITE_OTHER): Payer: Medicare Other

## 2019-03-20 ENCOUNTER — Telehealth: Payer: Self-pay | Admitting: Family Medicine

## 2019-03-20 ENCOUNTER — Other Ambulatory Visit: Payer: Self-pay

## 2019-03-20 DIAGNOSIS — M545 Low back pain, unspecified: Secondary | ICD-10-CM

## 2019-03-20 NOTE — Telephone Encounter (Signed)
Ok to schedule pt for xray.

## 2019-03-20 NOTE — Telephone Encounter (Signed)
Scheduled for today at 215

## 2019-03-20 NOTE — Telephone Encounter (Signed)
Patient calls in this morning and says his lower back issues are still not getting in better and would like to know if he can have an x-ray done.

## 2019-03-21 NOTE — Progress Notes (Signed)
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  Also they noted on 4 different levels where your vertebrae compressed down themselves-this can sometimes happen after a fall or injury.  If it happens spontaneously can be a sign of osteoporosis.  If you continue to have significant pain I would recommend a referral to sports medicineI would also recommend a bone density test/DEXA under compression fracture of lumbar spine if patient is agreeable

## 2019-04-09 ENCOUNTER — Telehealth: Payer: Self-pay

## 2019-04-09 ENCOUNTER — Other Ambulatory Visit: Payer: Self-pay

## 2019-04-09 DIAGNOSIS — M549 Dorsalgia, unspecified: Secondary | ICD-10-CM

## 2019-04-09 NOTE — Telephone Encounter (Signed)
Patient states that his back pain is not getting better. Patient states that he was told that if it does not get better thn he could see someone in sport meds. Patient states that he has went through his bottle of tylenol because he was told that he could take 2 a day. Offered patient the next available appt 04/17/19 at 9:40am and he declined. Please follow up with patient

## 2019-04-09 NOTE — Telephone Encounter (Signed)
Called patient gave all information will call if any issues or questions.

## 2019-04-09 NOTE — Telephone Encounter (Signed)
Yes thanks he can take 1000 mg of Tylenol 3 times a day-needs to make sure to separate this by at least 6 hours and avoid going over 3000 mg/day

## 2019-04-09 NOTE — Telephone Encounter (Signed)
Called patient  Have started referral to sports med He has been taking tylenol 500mg  2 tab tid. Wanted to make sure that was ok to continue

## 2019-04-12 ENCOUNTER — Ambulatory Visit (INDEPENDENT_AMBULATORY_CARE_PROVIDER_SITE_OTHER): Payer: Medicare Other | Admitting: Family Medicine

## 2019-04-12 ENCOUNTER — Encounter: Payer: Self-pay | Admitting: Family Medicine

## 2019-04-12 ENCOUNTER — Other Ambulatory Visit: Payer: Self-pay

## 2019-04-12 VITALS — BP 122/68 | HR 78 | Ht 71.0 in | Wt 132.0 lb

## 2019-04-12 DIAGNOSIS — M533 Sacrococcygeal disorders, not elsewhere classified: Secondary | ICD-10-CM | POA: Diagnosis not present

## 2019-04-12 DIAGNOSIS — G8929 Other chronic pain: Secondary | ICD-10-CM | POA: Diagnosis not present

## 2019-04-12 DIAGNOSIS — M545 Low back pain: Secondary | ICD-10-CM | POA: Diagnosis not present

## 2019-04-12 NOTE — Progress Notes (Signed)
    Subjective:    CC: Low back pain  I, Molly Weber, LAT, ATC, am serving as scribe for Dr. Lynne Leader.  HPI: Pt is a 74 y/o male presenting w/ c/o low back pain and pain radiating into his L groin since early January 2021.  He locates his pain to his R SIJ and R sacrum .  The only thing he thinks he did was to step in a hole w/ his R foot when he was walking his dog.  He rates his pain at a 4/10 and describes his pain as aching and nagging.  Radiating pain: No L LE numbness/tingling: No L LE weakness: No Aggravating factors: Forward flexion seated or standing Treatments tried: Tylenol; heat; ice  Diagnostic imaging: L-spine XR- 03/20/19  Pertinent review of Systems: No fevers or chills  Relevant historical information: BPH history glaucoma macular degeneration.  History of suicidal attempt while on high-dose steroids.   Objective:    Vitals:   04/12/19 0942  BP: 122/68  Pulse: 78  SpO2: 98%   General: Well Developed, well nourished, and in no acute distress.   MSK: L-spine: Slight forward and right lateral flexed positioning. Reasonably good motion limited extension great flexion. Nontender spinal midline.  Minimally tender palpation right SI joint. Lower extremity strength reflexes and sensation are intact distally. Normal gait.  Lab and Radiology Results DG Lumbar Spine Complete  Result Date: 03/20/2019 CLINICAL DATA:  Low back pain, no known injury, initial encounter EXAM: LUMBAR SPINE - COMPLETE 4+ VIEW COMPARISON:  None. FINDINGS: Five lumbar type vertebral bodies are well visualized. Chronic compression deformities involving L1, L3, L4 and L5 are noted. Multilevel disc space narrowing and osteophytic changes are seen. Facet hypertrophic changes are noted. No soft tissue abnormality is seen. IMPRESSION: Chronic degenerative changes and multilevel compression deformities without acute abnormality. Electronically Signed   By: Inez Catalina M.D.   On: 03/20/2019 23:11     I, Lynne Leader, personally (independently) visualized and performed the interpretation of the images attached in this note.    Impression and Recommendations:    Assessment and Plan: 74 y.o. male with right low back pain ongoing for about 6 weeks.  Fundamentally pain started after he stepped in a hole and jarred his low back.  He has considerable degenerative changes seen on x-ray in his L-spine which certainly could explain his pain.  Pain also likely due to myofascial strain and dysfunction.  SI joint dysfunction is also a possibility.  We discussed options.  Plan to continue heating pad and TENS unit and home exercise program.  Will refer to physical therapy for trial.  Check back in about a month.  If not improving would plan for trial of SI joint injection possibly.  Additionally would consider MRI for potential other injection planning.  PDMP not reviewed this encounter. Orders Placed This Encounter  Procedures  . Ambulatory referral to Physical Therapy    Referral Priority:   Routine    Referral Type:   Physical Medicine    Referral Reason:   Specialty Services Required    Requested Specialty:   Physical Therapy   No orders of the defined types were placed in this encounter.   Discussed warning signs or symptoms. Please see discharge instructions. Patient expresses understanding.   The above documentation has been reviewed and is accurate and complete Lynne Leader

## 2019-04-12 NOTE — Patient Instructions (Signed)
Thank you for coming in today. Plan for PT.  Use heating pad and TENS unit.  Ok to use over the counter voltaren gel up to 4x daily there.  Try using a TENS unit.  TENS UNIT: This is helpful for muscle pain and spasm.   Search and Purchase a TENS 7000 2nd edition at  www.tenspros.com or www.[place].com It should be less than $30.     TENS unit instructions: Do not shower or bathe with the unit on Turn the unit off before removing electrodes or batteries If the electrodes lose stickiness add a drop of water to the electrodes after they are disconnected from the unit and place on plastic sheet. If you continued to have difficulty, call the TENS unit company to purchase more electrodes. Do not apply lotion on the skin area prior to use. Make sure the skin is clean and dry as this will help prolong the life of the electrodes. After use, always check skin for unusual red areas, rash or other skin difficulties. If there are any skin problems, does not apply electrodes to the same area. Never remove the electrodes from the unit by pulling the wires. Do not use the TENS unit or electrodes other than as directed. Do not change electrode placement without consultating your therapist or physician. Keep 2 fingers with between each electrode. Wear time ratio is 2:1, on to off times.    For example on for 30 minutes off for 15 minutes and then on for 30 minutes off for 15 minutes    Recheck with me in about 1 month.  Let me know sooner if this is not working.     Lumbosacral Strain Lumbosacral strain is an injury that causes pain in the lower back (lumbosacral spine). This injury usually happens from overstretching the muscles or ligaments along your spine. Ligaments are cord-like tissues that connect bones to other bones. A strain can affect one or more muscles or ligaments. What are the causes? This condition may be caused by:  A hard, direct hit to the back.  Overstretching the lower  back muscles. This may result from: ? A fall. ? Lifting something heavy. ? Repetitive movements such as bending or crouching. What increases the risk? The following factors may make you more likely to develop this condition:  Participating in sports or activities that involve: ? A sudden twist of the back. ? Pushing or pulling motions.  Being overweight or obese.  Having poor strength and flexibility, especially tight hamstrings or weak muscles in the back or abdomen.  Having too much of a curve in the lower back.  Having a pelvis that is tilted forward. What are the signs or symptoms? The main symptom of this condition is pain in the lower back, at the site of the strain. Pain may also be felt down one or both legs. How is this diagnosed? This condition is diagnosed based on your symptoms, your medical history, and a physical exam. During the physical exam, your health care provider may push on certain areas of your back to find the source of your pain. You may be asked to bend forward, backward, and side to side to check your pain and range of motion. You may also have imaging tests, such as X-rays and an MRI. How is this treated? This condition may be treated by:  Applying heat and cold on the affected area.  Taking medicines to help relieve pain and relax your muscles.  Taking NSAIDs, such as  ibuprofen, to help reduce swelling and discomfort.  Doing stretching and strengthening exercises for your lower back. Symptoms usually improve within several weeks of treatment. However, recovery time varies. When your symptoms improve, gradually return to your normal routine as soon as possible to reduce pain, avoid stiffness, and keep muscle strength. Follow these instructions at home: Medicines  Take over-the-counter and prescription medicines only as told by your health care provider.  Ask your health care provider if the medicine prescribed to you: ? Requires you to avoid driving  or using heavy machinery. ? Can cause constipation. You may need to take these actions to prevent or treat constipation:  Drink enough fluid to keep your urine pale yellow.  Take over-the-counter or prescription medicines.  Eat foods that are high in fiber, such as beans, whole grains, and fresh fruits and vegetables.  Limit foods that are high in fat and processed sugars, such as fried or sweet foods. Managing pain, stiffness, and swelling      If directed, put ice on the injured area. To do this: ? Put ice in a plastic bag. ? Place a towel between your skin and the bag. ? Leave the ice on for 20 minutes, 2-3 times a day.  If directed, apply heat on the affected area as often as told by your health care provider. Use the heat source that your health care provider recommends, such as a moist heat pack or a heating pad. ? Place a towel between your skin and the heat source. ? Leave the heat on for 20-30 minutes. ? Remove the heat if your skin turns bright red. This is especially important if you are unable to feel pain, heat, or cold. You may have a greater risk of getting burned. Activity  Rest as told by your health care provider.  Do not stay in bed. Staying in bed for more than 1-2 days can delay your recovery.  Return to your normal activities as told by your health care provider. Ask your health care provider what activities are safe for you.  Avoid activities that take a lot of energy for as long as told by your health care provider.  Do exercises as told by your health care provider. This includes stretching and strengthening exercises. General instructions  Sit up and stand up straight. Avoid leaning forward when you sit, or hunching over when you stand.  Do not use any products that contain nicotine or tobacco, such as cigarettes, e-cigarettes, and chewing tobacco. If you need help quitting, ask your health care provider.  Keep all follow-up visits as told by your  health care provider. This is important. How is this prevented?   Use correct form when playing sports and lifting heavy objects.  Use good posture when sitting and standing.  Maintain a healthy weight.  Sleep on a mattress with medium firmness to support your back.  Do at least 150 minutes of moderate-intensity exercise each week, such as brisk walking or water aerobics. Try a form of exercise that takes stress off your back, such as swimming or stationary cycling.  Maintain physical fitness, including: ? Strength. ? Flexibility. Contact a health care provider if:  Your back pain does not improve after several weeks of treatment.  Your symptoms get worse. Get help right away if:  Your back pain is severe.  You cannot stand or walk.  You have difficulty controlling when you urinate or when you have a bowel movement.  You feel nauseous or you  vomit.  Your feet or legs get very cold, turn pale, or look blue.  You have numbness, tingling, weakness, or problems using your arms or legs.  You develop any of the following: ? Shortness of breath. ? Dizziness. ? Pain in your legs. ? Weakness in your buttocks or legs. Summary  Lumbosacral strain is an injury that causes pain in the lower back (lumbosacral spine).  This injury usually happens from overstretching the muscles or ligaments along your spine.  This condition may be caused by a direct hit to the lower back or by overstretching the lower back muscles.  Symptoms usually improve within several weeks of treatment. This information is not intended to replace advice given to you by your health care provider. Make sure you discuss any questions you have with your health care provider. Document Revised: 05/30/2018 Document Reviewed: 05/30/2018 Elsevier Patient Education  [place].

## 2019-04-16 ENCOUNTER — Encounter: Payer: Self-pay | Admitting: Physical Therapy

## 2019-04-16 ENCOUNTER — Other Ambulatory Visit: Payer: Self-pay

## 2019-04-16 ENCOUNTER — Ambulatory Visit (INDEPENDENT_AMBULATORY_CARE_PROVIDER_SITE_OTHER): Payer: Medicare Other | Admitting: Physical Therapy

## 2019-04-16 DIAGNOSIS — M545 Low back pain, unspecified: Secondary | ICD-10-CM

## 2019-04-16 DIAGNOSIS — M6281 Muscle weakness (generalized): Secondary | ICD-10-CM

## 2019-04-16 NOTE — Patient Instructions (Signed)
Access Code: [email or web address] URL: [email or web address] Date: 04/16/2019 Prepared by: Lyndee Hensen  Exercises Supine Single Knee to Chest Stretch - 2 x daily - 3 reps - 30 hold Supine Posterior Pelvic Tilt - 2 x daily - 2 sets - 10 reps Supine Lower Trunk Rotation - 2 x daily - 5 reps - 10 hold Standing Sidebending with Chair Support - 2 x daily - 3 reps - 30 hold

## 2019-04-16 NOTE — Therapy (Signed)
[place], [place], [address] Phone: [phone]   Fax:  [phone]  Physical Therapy Evaluation  Patient Details  Name: Kevin Perez MRN: [id number] Date of Birth: [birth year] Referring Provider (PT): Lynne Leader   Encounter Date: 04/16/2019  PT End of Session - 04/16/19 0855    Visit Number  1    Number of Visits  12    Date for PT Re-Evaluation  05/28/19    Authorization Type  Medicare    PT Start Time  0846    PT Stop Time  0929    PT Time Calculation (min)  43 min    Activity Tolerance  Patient tolerated treatment well    Behavior During Therapy  [facility] for tasks assessed/performed       Past Medical History:  Diagnosis Date  . GERD 07/21/2006  . Gout   . HYPERLIPIDEMIA 06/17/2009  . HYPERTENSION 07/21/2006  . Raynaud's syndrome 07/21/2006  . Suicide attempt [facility])    Due to Prednisone!  Marland Kitchen VASCULITIS 05/23/2007    Past Surgical History:  Procedure Laterality Date  . HERNIA REPAIR  [id number]   umbilical hernia  . INGUINAL HERNIA REPAIR     Bil    There were no vitals filed for this visit.   Subjective Assessment - 04/16/19 0853    Subjective  Pt states onset of back pain for about 3 months, not improving. He is Retired, has Engineer, materials, he cares for, walks 3x/day, 10 min.  A lot of bending picking dog up and down. Reports no back pain previously, pain just started in January, stepped in small hole when walking. Also states 27 lb weight loss, will look into pts recent weigh ins at [facility].    Limitations  Lifting;Standing;Walking;House hold activities    Currently in Pain?  Yes    Pain Score  8     Pain Location  Back    Pain Orientation  Right;Left    Pain Descriptors / Indicators  Aching    Pain Type  Acute pain    Pain Radiating Towards  Pain 4/10 up to 8/10 with standing.    Pain Onset  More than a month ago    Pain Frequency  Intermittent    Aggravating Factors   standing, washing clothes,  working    Pain Relieving Factors  sitting , heating pad         [facility] PT Assessment - 04/16/19 0001      Assessment   Medical Diagnosis  Low back pain    Referring Provider (PT)  Lynne Leader    Prior Therapy  no      Balance Screen   Has the patient fallen in the past 6 months  No      Prior Function   Level of Independence  Independent      Cognition   Overall Cognitive Status  Within Functional Limits for tasks assessed      Posture/Postural Control   Posture Comments  Significant fwd flexion in spine, moderate L trunk SB (scoliosis),       ROM / Strength   AROM / PROM / Strength  AROM;Strength      AROM   Overall AROM Comments  Hips: significant limitation for extension,  mod limitation for IR/ER. , other WFL.     AROM Assessment Site  Lumbar    Lumbar Flexion  wfl    Lumbar Extension  unable to get to neutral  Lumbar - Right Side Bend  mod limitation    Lumbar - Left Side Bend  mild limitation      Strength   Overall Strength Comments  Hips: 4/5, Knee: 4/5       Palpation   Palpation comment  Significant pain to palapate small area in R SI,  and in R QL. ,  Significant hypomobility of lumbar and thoracic spine with PAs.       Special Tests   Other special tests  Neg SLR                 Objective measurements completed on examination: See above findings.      [place] Adult PT Treatment/Exercise - 04/16/19 0001      Exercises   Exercises  Lumbar      Lumbar Exercises: Stretches   Single Knee to Chest Stretch  3 reps;30 seconds    Lower Trunk Rotation  5 reps;10 seconds    Lower Trunk Rotation Limitations  gentile, limited ROM.     Pelvic Tilt  20 reps    Other Lumbar Stretch Exercise  Standing QL stretch on R, 30 sec x 3;       Manual Therapy   Manual Therapy  Soft tissue mobilization    Soft tissue mobilization  STM/DTM to R SI and R QL             PT Education - 04/16/19 1240    Education Details  PT POC, Exam findings, HEP     Person(s) Educated  Patient    Methods  Explanation;Demonstration;Tactile cues;Verbal cues;Handout    Comprehension  Verbalized understanding;Returned demonstration;Tactile cues required;Need further instruction;Verbal cues required       PT Short Term Goals - 04/16/19 1242      PT SHORT TERM GOAL #1   Title  Pt to be independent with initial HEP    Time  2    Period  Weeks    Status  New    Target Date  04/30/19      PT SHORT TERM GOAL #2   Title  Pt to report decreased pain in R side low back to 5/10 with standing activity    Time  2    Period  Weeks    Status  New    Target Date  04/30/19      PT SHORT TERM GOAL #3   Title  Pt to demo log roll for supine to sit transfer for back pain    Time  2    Period  Weeks    Status  New    Target Date  04/30/19        PT Long Term Goals - 04/16/19 1243      PT LONG TERM GOAL #1   Title  Pt to report decreased pain in R low back to 0-2/10 with standing and activity.    Time  6    Period  Weeks    Status  New    Target Date  05/28/19      PT LONG TERM GOAL #2   Title  Pt to report improved LE strength of hips and knees to at least 4+/5 to improve ability for functional activity    Time  6    Period  Weeks    Status  New    Target Date  05/28/19      PT LONG TERM GOAL #3   Title  Pt to be independent wtih  final HEP for back and LE 6    Period  Weeks    Status  New    Target Date  05/28/19      PT LONG TERM GOAL #4   Title  Pt to demo proper lift, squat, bend technique , to improve back pain with IADLS.    Time  6    Period  Weeks    Status  New    Target Date  05/28/19             Plan - 04/16/19 1251    Clinical Impression Statement  Pt presents with primary complaint of increased pain in R side of low back. Pt with poor spine mobility (other than flexion), and poor posture. Pt with decreased strength of LEs, and lack of effective HEP. Pt with decreased ability for standing, IADLS, cooking, and funcitonal  activity, due to pain. Pt to benefit from skilled PT to improve.    Personal Factors and Comorbidities  Comorbidity 1    Comorbidities  Significant degenerative findings on x-ray    Examination-Activity Limitations  Locomotion Level;Squat;Stand;Lift    Examination-Participation Restrictions  Meal Prep;Cleaning;Community Activity;Shop;Laundry;Yard Work    Stability/Clinical Decision Making  Stable/Uncomplicated    Designer, jewellery  Low    Rehab Potential  Fair    PT Frequency  2x / week    PT Duration  6 weeks    PT Treatment/Interventions  ADLs/Self Care Home Management;Cryotherapy;Electrical Stimulation;Gait training;DME Instruction;Ultrasound;Traction;Moist Heat;Iontophoresis 4mg /ml Dexamethasone;Stair training;Functional mobility training;Therapeutic activities;Therapeutic exercise;Balance training;Neuromuscular re-education;Manual techniques;Orthotic Fit/Training;Patient/family education;Passive range of motion;Dry needling;Taping;Joint Manipulations;Spinal Manipulations;Vestibular;Vasopneumatic Device    Consulted and Agree with Plan of Care  Patient       Patient will benefit from skilled therapeutic intervention in order to improve the following deficits and impairments:  Decreased range of motion, Increased muscle spasms, Decreased activity tolerance, Pain, Improper body mechanics, Impaired flexibility, Decreased mobility, Decreased strength, Postural dysfunction, Hypomobility  Visit Diagnosis: Acute bilateral low back pain without sciatica  Muscle weakness (generalized)     Problem List Patient Active Problem List   Diagnosis Date Noted  . Former smoker 04/30/2014  . Testicular swelling, left 04/30/2014  . BPH associated with nocturia 04/30/2014  . Prepatellar bursitis of right knee 01/22/2014  . Gout 01/08/2014  . Insomnia 10/29/2013  . Senile cataracts of both eyes 08/22/2013  . 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 Macular degeneration 02/03/2011  . Hyperlipidemia 06/17/2009  . History of arteritis 05/23/2007  . History of suicide attempt- while on high dose steroids. avoid prednisone.  01/19/2007  . Essential hypertension 07/21/2006  . GERD 07/21/2006   Lyndee Hensen, PT, DPT 12:55 PM  04/16/19    [facility] [place], [place], [address] Phone: [phone]   Fax:  [phone]  Name: Kevin Perez MRN: [id number] Date of Birth: 1945/09/05

## 2019-04-18 ENCOUNTER — Ambulatory Visit (INDEPENDENT_AMBULATORY_CARE_PROVIDER_SITE_OTHER): Payer: Medicare Other | Admitting: Physical Therapy

## 2019-04-18 ENCOUNTER — Encounter: Payer: Self-pay | Admitting: Physical Therapy

## 2019-04-18 ENCOUNTER — Other Ambulatory Visit: Payer: Self-pay

## 2019-04-18 DIAGNOSIS — M545 Low back pain, unspecified: Secondary | ICD-10-CM

## 2019-04-18 DIAGNOSIS — M6281 Muscle weakness (generalized): Secondary | ICD-10-CM

## 2019-04-18 NOTE — Patient Instructions (Signed)
Access Code: [email or web address] URL: [email or web address] Date: 04/18/2019 Prepared by: Lyndee Hensen  Exercises Supine Single Knee to Chest Stretch - 2 x daily - 3 reps - 30 hold Supine Posterior Pelvic Tilt - 2 x daily - 2 sets - 10 reps Supine Lower Trunk Rotation - 2 x daily - 5 reps - 10 hold Standing Sidebending with Chair Support - 2 x daily - 3 reps - 30 hold Seated Hamstring Stretch - 2 x daily - 3 reps - 30 hold Supine March - 2 x daily - 2 sets - 10 reps Seated Knee Extension AROM - 1 x daily - 2 sets - 10 reps Supine Quadricep Sets - 2 x daily - 2 sets - 10 reps

## 2019-04-20 ENCOUNTER — Encounter: Payer: Self-pay | Admitting: Physical Therapy

## 2019-04-20 NOTE — Therapy (Signed)
[facility] [address] [place], [place], [address] Phone: [phone]   Fax:  [phone]  Physical Therapy Treatment  Patient Details  Name: Kevin Perez MRN: [id number] Date of Birth: Jul 15, 1945 Referring Provider (PT): Lynne Leader   Encounter Date: 04/18/2019  PT End of Session - 04/20/19 1416    Visit Number  2    Number of Visits  12    Date for PT Re-Evaluation  05/28/19    Authorization Type  Medicare    PT Start Time  1512    PT Stop Time  1555    PT Time Calculation (min)  43 min    Activity Tolerance  Patient tolerated treatment well    Behavior During Therapy  [name] for tasks assessed/performed       Past Medical History:  Diagnosis Date  . GERD 07/21/2006  . Gout   . HYPERLIPIDEMIA 06/17/2009  . HYPERTENSION 07/21/2006  . Raynaud's syndrome 07/21/2006  . Suicide attempt [facility])    Due to Prednisone!  Marland Kitchen VASCULITIS 05/23/2007    Past Surgical History:  Procedure Laterality Date  . HERNIA REPAIR  [id number]   umbilical hernia  . INGUINAL HERNIA REPAIR     Bil    There were no vitals filed for this visit.  Subjective Assessment - 04/20/19 1415    Subjective  Pt states less pain in back, has some soreness in glute, hips.    Currently in Pain?  Yes    Pain Score  4     Pain Location  Back    Pain Orientation  Right;Left    Pain Descriptors / Indicators  Aching    Pain Type  Acute pain    Pain Onset  More than a month ago    Pain Frequency  Intermittent                       [facility] Adult PT Treatment/Exercise - 04/20/19 0001      Posture/Postural Control   Posture Comments  Significant fwd flexion in spine, moderate L trunk SB (scoliosis),       Exercises   Exercises  Lumbar      Lumbar Exercises: Stretches   Active Hamstring Stretch  3 reps;30 seconds    Active Hamstring Stretch Limitations  seated    Single Knee to Chest Stretch  3 reps;30 seconds    Lower Trunk Rotation  5 reps;10 seconds     Pelvic Tilt  20 reps      Lumbar Exercises: Aerobic   Recumbent Bike  L1 x 8 min;       Lumbar Exercises: Standing   Other Standing Lumbar Exercises  March x20: Hip abd x20 bil;       Lumbar Exercises: Seated   Long Arc Quad on Chair  20 reps;Both    LAQ on Chair Weights (lbs)  2      Lumbar Exercises: Supine   Bent Knee Raise  20 reps    Straight Leg Raise  10 reps      Manual Therapy   Manual Therapy  Soft tissue mobilization;Manual Traction    Soft tissue mobilization  STM/Roller to R SI and R QL    Manual Traction  long leg distraction x2 min bil for back;                PT Short Term Goals - 04/16/19 1242      PT SHORT TERM GOAL #1  Title  Pt to be independent with initial HEP    Time  2    Period  Weeks    Status  New    Target Date  04/30/19      PT SHORT TERM GOAL #2   Title  Pt to report decreased pain in R side low back to 5/10 with standing activity    Time  2    Period  Weeks    Status  New    Target Date  04/30/19      PT SHORT TERM GOAL #3   Title  Pt to demo log roll for supine to sit transfer for back pain    Time  2    Period  Weeks    Status  New    Target Date  04/30/19        PT Long Term Goals - 04/16/19 1243      PT LONG TERM GOAL #1   Title  Pt to report decreased pain in R low back to 0-2/10 with standing and activity.    Time  6    Period  Weeks    Status  New    Target Date  05/28/19      PT LONG TERM GOAL #2   Title  Pt to report improved LE strength of hips and knees to at least 4+/5 to improve ability for functional activity    Time  6    Period  Weeks    Status  New    Target Date  05/28/19      PT LONG TERM GOAL #3   Title  Pt to be independent wtih final HEP for back and LE 6    Period  Weeks    Status  New    Target Date  05/28/19      PT LONG TERM GOAL #4   Title  Pt to demo proper lift, squat, bend technique , to improve back pain with IADLS.    Time  6    Period  Weeks    Status  New    Target  Date  05/28/19            Plan - 04/20/19 1417    Clinical Impression Statement  Ther ex progressed for lumbar and hip mobility and strength. Reviewed correct mechanics for HEP. Pt req max cuing to do exercises lightly. Plan to progress as tolerated.    Personal Factors and Comorbidities  Comorbidity 1    Comorbidities  Significant degenerative findings on x-ray    Examination-Activity Limitations  Locomotion Level;Squat;Stand;Lift    Examination-Participation Restrictions  Meal Prep;Cleaning;Community Activity;Shop;Laundry;Yard Work    Stability/Clinical Decision Making  Stable/Uncomplicated    Rehab Potential  Fair    PT Frequency  2x / week    PT Duration  6 weeks    PT Treatment/Interventions  ADLs/Self Care Home Management;Cryotherapy;Electrical Stimulation;Gait training;DME Instruction;Ultrasound;Traction;Moist Heat;Iontophoresis 4mg /ml Dexamethasone;Stair training;Functional mobility training;Therapeutic activities;Therapeutic exercise;Balance training;Neuromuscular re-education;Manual techniques;Orthotic Fit/Training;Patient/family education;Passive range of motion;Dry needling;Taping;Joint Manipulations;Spinal Manipulations;Vestibular;Vasopneumatic Device    Consulted and Agree with Plan of Care  Patient       Patient will benefit from skilled therapeutic intervention in order to improve the following deficits and impairments:  Decreased range of motion, Increased muscle spasms, Decreased activity tolerance, Pain, Improper body mechanics, Impaired flexibility, Decreased mobility, Decreased strength, Postural dysfunction, Hypomobility  Visit Diagnosis: Acute bilateral low back pain without sciatica  Muscle weakness (generalized)     Problem List Patient Active  Problem List   Diagnosis Date Noted  . Former smoker 04/30/2014  . Testicular swelling, left 04/30/2014  . BPH associated with nocturia 04/30/2014  . Prepatellar bursitis of right knee 01/22/2014  . Gout  01/08/2014  . Insomnia 10/29/2013  . Senile cataracts of both eyes 08/22/2013  . 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 Macular degeneration 02/03/2011  . Hyperlipidemia 06/17/2009  . History of arteritis 05/23/2007  . History of suicide attempt- while on high dose steroids. avoid prednisone.  01/19/2007  . Essential hypertension 07/21/2006  . GERD 07/21/2006    Lyndee Hensen, PT, DPT 2:18 PM  04/20/19    [facility] [place], [place], [address] Phone: [phone]   Fax:  [phone]  Name: VARTAN HAUSSER MRN: [id number] Date of Birth: [birth year]

## 2019-04-23 ENCOUNTER — Encounter: Payer: Self-pay | Admitting: Physical Therapy

## 2019-04-23 ENCOUNTER — Ambulatory Visit (INDEPENDENT_AMBULATORY_CARE_PROVIDER_SITE_OTHER): Payer: Medicare Other | Admitting: Physical Therapy

## 2019-04-23 ENCOUNTER — Other Ambulatory Visit: Payer: Self-pay

## 2019-04-23 DIAGNOSIS — M545 Low back pain, unspecified: Secondary | ICD-10-CM

## 2019-04-23 DIAGNOSIS — M6281 Muscle weakness (generalized): Secondary | ICD-10-CM | POA: Diagnosis not present

## 2019-04-23 NOTE — Therapy (Signed)
[facility] [address] [place], [place], [address] Phone: [phone]   Fax:  [phone]  Physical Therapy Treatment  Patient Details  Name: Kevin Perez MRN: [id number] Date of Birth: 09-05-1945 Referring Provider (PT): Lynne Leader   Encounter Date: 04/23/2019  PT End of Session - 04/23/19 0938    Visit Number  3    Number of Visits  12    Date for PT Re-Evaluation  05/28/19    Authorization Type  Medicare    PT Start Time  0931    PT Stop Time  1015    PT Time Calculation (min)  44 min    Activity Tolerance  Patient tolerated treatment well    Behavior During Therapy  [facility] for tasks assessed/performed       Past Medical History:  Diagnosis Date  . GERD 07/21/2006  . Gout   . HYPERLIPIDEMIA 06/17/2009  . HYPERTENSION 07/21/2006  . Raynaud's syndrome 07/21/2006  . Suicide attempt [facility])    Due to Prednisone!  Marland Kitchen VASCULITIS 05/23/2007    Past Surgical History:  Procedure Laterality Date  . HERNIA REPAIR  [id number]   umbilical hernia  . INGUINAL HERNIA REPAIR     Bil    There were no vitals filed for this visit.  Subjective Assessment - 04/23/19 0937    Subjective  Pt states pain into hips/glutes. Having more difficulty walking and with mobility after doing exercises.Also states he picks up his 8lb dog several times/day.    Currently in Pain?  Yes    Pain Score  8     Pain Location  Back    Pain Orientation  Right;Left    Pain Descriptors / Indicators  Aching    Pain Type  Acute pain    Pain Onset  More than a month ago    Pain Frequency  Intermittent                       [facility] Adult PT Treatment/Exercise - 04/23/19 0941      Posture/Postural Control   Posture Comments  Significant fwd flexion in spine, moderate L trunk SB (scoliosis),       Exercises   Exercises  Lumbar      Lumbar Exercises: Stretches   Active Hamstring Stretch  --    Active Hamstring Stretch Limitations  --    Single Knee to  Chest Stretch  3 reps;30 seconds    Lower Trunk Rotation  5 reps;10 seconds    Pelvic Tilt  --    Piriformis Stretch  30 seconds;2 reps      Lumbar Exercises: Aerobic   Recumbent Bike  L1 x 8 min;       Lumbar Exercises: Standing   Functional Squats  10 reps    Functional Squats Limitations  with education on back/leg mechanics and form    Other Standing Lumbar Exercises  --    Other Standing Lumbar Exercises  sit to stand, with eduction on back mechanics and form. x10      Lumbar Exercises: Seated   Long Arc Quad on Chair  20 reps;Both    LAQ on Chair Weights (lbs)  2      Lumbar Exercises: Supine   Bent Knee Raise  20 reps    Straight Leg Raise  --      Lumbar Exercises: Sidelying   Hip Abduction  Both;15 reps      Manual Therapy  Manual Therapy  Soft tissue mobilization;Manual Traction    Soft tissue mobilization  --    Manual Traction  long leg distraction x2 min bil for back;                PT Short Term Goals - 04/16/19 1242      PT SHORT TERM GOAL #1   Title  Pt to be independent with initial HEP    Time  2    Period  Weeks    Status  New    Target Date  04/30/19      PT SHORT TERM GOAL #2   Title  Pt to report decreased pain in R side low back to 5/10 with standing activity    Time  2    Period  Weeks    Status  New    Target Date  04/30/19      PT SHORT TERM GOAL #3   Title  Pt to demo log roll for supine to sit transfer for back pain    Time  2    Period  Weeks    Status  New    Target Date  04/30/19        PT Long Term Goals - 04/16/19 1243      PT LONG TERM GOAL #1   Title  Pt to report decreased pain in R low back to 0-2/10 with standing and activity.    Time  6    Period  Weeks    Status  New    Target Date  05/28/19      PT LONG TERM GOAL #2   Title  Pt to report improved LE strength of hips and knees to at least 4+/5 to improve ability for functional activity    Time  6    Period  Weeks    Status  New    Target Date   05/28/19      PT LONG TERM GOAL #3   Title  Pt to be independent wtih final HEP for back and LE 6    Period  Weeks    Status  New    Target Date  05/28/19      PT LONG TERM GOAL #4   Title  Pt to demo proper lift, squat, bend technique , to improve back pain with IADLS.    Time  6    Period  Weeks    Status  New    Target Date  05/28/19            Plan - 04/23/19 1049    Clinical Impression Statement  Reviewed proper squat/lift techniques today, for decreasd pressure on back when picking up dog. Pt using only back to bend/lift prior to education and practice today. Reviewed HEP, discussed not doing exercises if they are increasing pain.    Personal Factors and Comorbidities  Comorbidity 1    Comorbidities  Significant degenerative findings on x-ray    Examination-Activity Limitations  Locomotion Level;Squat;Stand;Lift    Examination-Participation Restrictions  Meal Prep;Cleaning;Community Activity;Shop;Laundry;Yard Work    Stability/Clinical Decision Making  Stable/Uncomplicated    Rehab Potential  Fair    PT Frequency  2x / week    PT Duration  6 weeks    PT Treatment/Interventions  ADLs/Self Care Home Management;Cryotherapy;Electrical Stimulation;Gait training;DME Instruction;Ultrasound;Traction;Moist Heat;Iontophoresis 4mg /ml Dexamethasone;Stair training;Functional mobility training;Therapeutic activities;Therapeutic exercise;Balance training;Neuromuscular re-education;Manual techniques;Orthotic Fit/Training;Patient/family education;Passive range of motion;Dry needling;Taping;Joint Manipulations;Spinal Manipulations;Vestibular;Vasopneumatic Device    Consulted and Agree with Plan  of Care  Patient       Patient will benefit from skilled therapeutic intervention in order to improve the following deficits and impairments:  Decreased range of motion, Increased muscle spasms, Decreased activity tolerance, Pain, Improper body mechanics, Impaired flexibility, Decreased mobility,  Decreased strength, Postural dysfunction, Hypomobility  Visit Diagnosis: Acute bilateral low back pain without sciatica  Muscle weakness (generalized)     Problem List Patient Active Problem List   Diagnosis Date Noted  . Former smoker 04/30/2014  . Testicular swelling, left 04/30/2014  . BPH associated with nocturia 04/30/2014  . Prepatellar bursitis of right knee 01/22/2014  . Gout 01/08/2014  . Insomnia 10/29/2013  . Senile cataracts of both eyes 08/22/2013  . 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 Macular degeneration 02/03/2011  . Hyperlipidemia 06/17/2009  . History of arteritis 05/23/2007  . History of suicide attempt- while on high dose steroids. avoid prednisone.  01/19/2007  . Essential hypertension 07/21/2006  . GERD 07/21/2006   Lyndee Hensen, PT, DPT 10:52 AM  04/23/19    [facility] [place], [place], [address] Phone: [phone]   Fax:  [phone]  Name: SHAQUIELLE GRANGER MRN: [id number] Date of Birth: [birth year]

## 2019-04-25 ENCOUNTER — Encounter: Payer: Self-pay | Admitting: Physical Therapy

## 2019-04-25 ENCOUNTER — Other Ambulatory Visit: Payer: Self-pay

## 2019-04-25 ENCOUNTER — Ambulatory Visit (INDEPENDENT_AMBULATORY_CARE_PROVIDER_SITE_OTHER): Payer: Medicare Other | Admitting: Physical Therapy

## 2019-04-25 DIAGNOSIS — M6281 Muscle weakness (generalized): Secondary | ICD-10-CM

## 2019-04-25 DIAGNOSIS — M545 Low back pain, unspecified: Secondary | ICD-10-CM

## 2019-04-25 NOTE — Therapy (Signed)
[facility] [address] [place], [place], [address] Phone: [phone]   Fax:  [phone]  Physical Therapy Treatment  Patient Details  Name: Kevin Perez MRN: [id number] Date of Birth: 1945/10/07 Referring Provider (PT): Lynne Leader   Encounter Date: 04/25/2019  PT End of Session - 04/25/19 0940    Visit Number  4    Number of Visits  12    Date for PT Re-Evaluation  05/28/19    Authorization Type  Medicare    PT Start Time  0932    PT Stop Time  1015    PT Time Calculation (min)  43 min    Activity Tolerance  Patient tolerated treatment well    Behavior During Therapy  [facility] for tasks assessed/performed       Past Medical History:  Diagnosis Date  . GERD 07/21/2006  . Gout   . HYPERLIPIDEMIA 06/17/2009  . HYPERTENSION 07/21/2006  . Raynaud's syndrome 07/21/2006  . Suicide attempt [facility])    Due to Prednisone!  Marland Kitchen VASCULITIS 05/23/2007    Past Surgical History:  Procedure Laterality Date  . HERNIA REPAIR  [id number]   umbilical hernia  . INGUINAL HERNIA REPAIR     Bil    There were no vitals filed for this visit.  Subjective Assessment - 04/25/19 0940    Subjective  Pt started using TENS unit yesterday. Also rode bike at home yesterday    Currently in Pain?  Yes    Pain Score  7     Pain Location  Back    Pain Orientation  Right;Left    Pain Descriptors / Indicators  Aching    Pain Type  Acute pain    Pain Onset  More than a month ago    Pain Frequency  Intermittent                       [facility] Adult PT Treatment/Exercise - 04/25/19 0941      Posture/Postural Control   Posture Comments  Significant fwd flexion in spine, moderate L trunk SB (scoliosis),       Exercises   Exercises  Lumbar      Lumbar Exercises: Stretches   Single Knee to Chest Stretch  --    Lower Trunk Rotation  --    Piriformis Stretch  --      Lumbar Exercises: Aerobic   Recumbent Bike  L1 x 8 min;       Lumbar Exercises:  Standing   Heel Raises  20 reps    Functional Squats  --    Functional Squats Limitations  --    Other Standing Lumbar Exercises  March x20: Hip abd x20 bil;     Other Standing Lumbar Exercises  --      Lumbar Exercises: Seated   Long Arc Quad on Chair  20 reps;Both    LAQ on Chair Weights (lbs)  2    Other Seated Lumbar Exercises  Hamstring curl RTB X15 bil;       Lumbar Exercises: Supine   Bent Knee Raise  20 reps      Lumbar Exercises: Sidelying   Hip Abduction  --      Manual Therapy   Manual Therapy  Soft tissue mobilization;Manual Traction;Passive ROM    Manual therapy comments  skilled palpation and monitoring of soft tissue with dry needling.     Soft tissue mobilization  DTM to R glute  Passive ROM  manual stretch for R HS and R hip    Manual Traction  long leg distraction x2 min bil for back;        Trigger Point Dry Needling - 04/25/19 0001    Consent Given?  Yes    Education Handout Provided  Yes    Muscles Treated Back/Hip  Gluteus minimus;Gluteus medius;Piriformis    Gluteus Minimus Response  Palpable increased muscle length    Gluteus Medius Response  Twitch response elicited;Palpable increased muscle length    Piriformis Response  Palpable increased muscle length             PT Short Term Goals - 04/16/19 1242      PT SHORT TERM GOAL #1   Title  Pt to be independent with initial HEP    Time  2    Period  Weeks    Status  New    Target Date  04/30/19      PT SHORT TERM GOAL #2   Title  Pt to report decreased pain in R side low back to 5/10 with standing activity    Time  2    Period  Weeks    Status  New    Target Date  04/30/19      PT SHORT TERM GOAL #3   Title  Pt to demo log roll for supine to sit transfer for back pain    Time  2    Period  Weeks    Status  New    Target Date  04/30/19        PT Long Term Goals - 04/16/19 1243      PT LONG TERM GOAL #1   Title  Pt to report decreased pain in R low back to 0-2/10 with  standing and activity.    Time  6    Period  Weeks    Status  New    Target Date  05/28/19      PT LONG TERM GOAL #2   Title  Pt to report improved LE strength of hips and knees to at least 4+/5 to improve ability for functional activity    Time  6    Period  Weeks    Status  New    Target Date  05/28/19      PT LONG TERM GOAL #3   Title  Pt to be independent wtih final HEP for back and LE 6    Period  Weeks    Status  New    Target Date  05/28/19      PT LONG TERM GOAL #4   Title  Pt to demo proper lift, squat, bend technique , to improve back pain with IADLS.    Time  6    Period  Weeks    Status  New    Target Date  05/28/19            Plan - 04/25/19 1214    Clinical Impression Statement  Pt with good tolerance for dry needling today, will assess effects next visit. Pt with trigger points in R glute that refer pain into LE with palpation. Continued ther ex for stenosis, lumbar and LE strength. Plan to progress as tolerated.    Personal Factors and Comorbidities  Comorbidity 1    Comorbidities  Significant degenerative findings on x-ray    Examination-Activity Limitations  Locomotion Level;Squat;Stand;Lift    Examination-Participation Restrictions  Meal Prep;Cleaning;Community Activity;Shop;Laundry;[employer] Work  Stability/Clinical Decision Making  Stable/Uncomplicated    Rehab Potential  Fair    PT Frequency  2x / week    PT Duration  6 weeks    PT Treatment/Interventions  ADLs/Self Care Home Management;Cryotherapy;Electrical Stimulation;Gait training;DME Instruction;Ultrasound;Traction;Moist Heat;Iontophoresis 4mg /ml Dexamethasone;Stair training;Functional mobility training;Therapeutic activities;Therapeutic exercise;Balance training;Neuromuscular re-education;Manual techniques;Orthotic Fit/Training;Patient/family education;Passive range of motion;Dry needling;Taping;Joint Manipulations;Spinal Manipulations;Vestibular;Vasopneumatic Device    Consulted and Agree with  Plan of Care  Patient       Patient will benefit from skilled therapeutic intervention in order to improve the following deficits and impairments:  Decreased range of motion, Increased muscle spasms, Decreased activity tolerance, Pain, Improper body mechanics, Impaired flexibility, Decreased mobility, Decreased strength, Postural dysfunction, Hypomobility  Visit Diagnosis: Acute bilateral low back pain without sciatica  Muscle weakness (generalized)     Problem List Patient Active Problem List   Diagnosis Date Noted  . Former smoker 04/30/2014  . Testicular swelling, left 04/30/2014  . BPH associated with nocturia 04/30/2014  . Prepatellar bursitis of right knee 01/22/2014  . Gout 01/08/2014  . Insomnia 10/29/2013  . Senile cataracts of both eyes 08/22/2013  . 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 Macular degeneration 02/03/2011  . Hyperlipidemia 06/17/2009  . History of arteritis 05/23/2007  . History of suicide attempt- while on high dose steroids. avoid prednisone.  01/19/2007  . Essential hypertension 07/21/2006  . GERD 07/21/2006    Lyndee Hensen, PT, DPT 12:15 PM  04/25/19    [facility] [place], [place], [address] Phone: [phone]   Fax:  [phone]  Name: Kevin Perez MRN: [id number] Date of Birth: [birth year]

## 2019-04-30 ENCOUNTER — Other Ambulatory Visit: Payer: Self-pay

## 2019-04-30 ENCOUNTER — Ambulatory Visit (INDEPENDENT_AMBULATORY_CARE_PROVIDER_SITE_OTHER): Payer: Medicare Other | Admitting: Physical Therapy

## 2019-04-30 ENCOUNTER — Other Ambulatory Visit: Payer: Self-pay | Admitting: Family Medicine

## 2019-04-30 ENCOUNTER — Encounter: Payer: Self-pay | Admitting: Physical Therapy

## 2019-04-30 DIAGNOSIS — M545 Low back pain, unspecified: Secondary | ICD-10-CM

## 2019-04-30 DIAGNOSIS — M6281 Muscle weakness (generalized): Secondary | ICD-10-CM

## 2019-04-30 MED ORDER — CLONAZEPAM 1 MG PO TABS: ORAL_TABLET | ORAL | 1 refills | Status: DC

## 2019-04-30 NOTE — Telephone Encounter (Signed)
  LAST APPOINTMENT DATE: 04/09/2019   NEXT APPOINTMENT DATE:08/27/19  MEDICATION: clonazePAM (KLONOPIN) 1 MG tablet  PHARMACY:  [id number] - [place], [place] - [address] [place] [address] Phone:  [phone]  Fax:  [phone]

## 2019-04-30 NOTE — Therapy (Signed)
[facility] [address] [place], [place], [address] Phone: [phone]   Fax:  [phone]  Physical Therapy Treatment  Patient Details  Name: Kevin Perez MRN: [id number] Date of Birth: 03/14/1945 Referring Provider (PT): Lynne Leader   Encounter Date: 04/30/2019  PT End of Session - 04/30/19 0858    Visit Number  5    Number of Visits  12    Date for PT Re-Evaluation  05/28/19    Authorization Type  Medicare    PT Start Time  0845    PT Stop Time  0930    PT Time Calculation (min)  45 min    Activity Tolerance  Patient tolerated treatment well    Behavior During Therapy  [facility] for tasks assessed/performed       Past Medical History:  Diagnosis Date  . GERD 07/21/2006  . Gout   . HYPERLIPIDEMIA 06/17/2009  . HYPERTENSION 07/21/2006  . Raynaud's syndrome 07/21/2006  . Suicide attempt [facility])    Due to Prednisone!  Marland Kitchen VASCULITIS 05/23/2007    Past Surgical History:  Procedure Laterality Date  . HERNIA REPAIR  [id number]   umbilical hernia  . INGUINAL HERNIA REPAIR     Bil    There were no vitals filed for this visit.  Subjective Assessment - 04/30/19 0855    Subjective  Pt states less pain in back, thinks pain has moved into his hips. Reports only having to take 1 tylenol lately, instead of 2, 3x/day.    Currently in Pain?  Yes    Pain Score  3     Pain Location  Back    Pain Orientation  Right;Left    Pain Descriptors / Indicators  Aching    Pain Type  Acute pain    Pain Onset  More than a month ago    Pain Frequency  Intermittent                       [facility] Adult PT Treatment/Exercise - 04/30/19 0854      Posture/Postural Control   Posture Comments  Significant fwd flexion in spine, moderate L trunk SB (scoliosis),       Exercises   Exercises  Lumbar      Lumbar Exercises: Stretches   Piriformis Stretch  30 seconds;2 reps    Piriformis Stretch Limitations  seated      Lumbar Exercises: Aerobic   Recumbent Bike  L1 x 8 min;       Lumbar Exercises: Standing   Heel Raises  20 reps    Other Standing Lumbar Exercises  March x20: Hip abd x20 bil;       Lumbar Exercises: Seated   Long Arc Quad on Chair  20 reps;Both    LAQ on Chair Weights (lbs)  2    Other Seated Lumbar Exercises  Hamstring curl RTB X15 bil;       Lumbar Exercises: Supine   Bent Knee Raise  --      Manual Therapy   Manual Therapy  Soft tissue mobilization;Manual Traction;Passive ROM    Manual therapy comments  --    Soft tissue mobilization  STM/tennis ball  to Bil glute and hip    Passive ROM  manual stretch for R hip flexor and bil hip ER;     Manual Traction  long leg distraction x2 min bil for back;  PT Short Term Goals - 04/16/19 1242      PT SHORT TERM GOAL #1   Title  Pt to be independent with initial HEP    Time  2    Period  Weeks    Status  New    Target Date  04/30/19      PT SHORT TERM GOAL #2   Title  Pt to report decreased pain in R side low back to 5/10 with standing activity    Time  2    Period  Weeks    Status  New    Target Date  04/30/19      PT SHORT TERM GOAL #3   Title  Pt to demo log roll for supine to sit transfer for back pain    Time  2    Period  Weeks    Status  New    Target Date  04/30/19        PT Long Term Goals - 04/16/19 1243      PT LONG TERM GOAL #1   Title  Pt to report decreased pain in R low back to 0-2/10 with standing and activity.    Time  6    Period  Weeks    Status  New    Target Date  05/28/19      PT LONG TERM GOAL #2   Title  Pt to report improved LE strength of hips and knees to at least 4+/5 to improve ability for functional activity    Time  6    Period  Weeks    Status  New    Target Date  05/28/19      PT LONG TERM GOAL #3   Title  Pt to be independent wtih final HEP for back and LE 6    Period  Weeks    Status  New    Target Date  05/28/19      PT LONG TERM GOAL #4   Title  Pt to demo proper lift,  squat, bend technique , to improve back pain with IADLS.    Time  6    Period  Weeks    Status  New    Target Date  05/28/19            Plan - 04/30/19 1134    Clinical Impression Statement  Pt with mild soreness with STM in bil glute region. Pain in low back is improving. Pt to benefit from continued strengthening of LEs. He requires mod-max cueing to perform ther ex correctly.    Personal Factors and Comorbidities  Comorbidity 1    Comorbidities  Significant degenerative findings on x-ray    Examination-Activity Limitations  Locomotion Level;Squat;Stand;Lift    Examination-Participation Restrictions  Meal Prep;Cleaning;Community Activity;Shop;Laundry;Yard Work    Stability/Clinical Decision Making  Stable/Uncomplicated    Rehab Potential  Fair    PT Frequency  2x / week    PT Duration  6 weeks    PT Treatment/Interventions  ADLs/Self Care Home Management;Cryotherapy;Electrical Stimulation;Gait training;DME Instruction;Ultrasound;Traction;Moist Heat;Iontophoresis 4mg /ml Dexamethasone;Stair training;Functional mobility training;Therapeutic activities;Therapeutic exercise;Balance training;Neuromuscular re-education;Manual techniques;Orthotic Fit/Training;Patient/family education;Passive range of motion;Dry needling;Taping;Joint Manipulations;Spinal Manipulations;Vestibular;Vasopneumatic Device    Consulted and Agree with Plan of Care  Patient       Patient will benefit from skilled therapeutic intervention in order to improve the following deficits and impairments:  Decreased range of motion, Increased muscle spasms, Decreased activity tolerance, Pain, Improper body mechanics, Impaired flexibility, Decreased mobility, Decreased strength, Postural  dysfunction, Hypomobility  Visit Diagnosis: Acute bilateral low back pain without sciatica  Muscle weakness (generalized)     Problem List Patient Active Problem List   Diagnosis Date Noted  . Former smoker 04/30/2014  . Testicular  swelling, left 04/30/2014  . BPH associated with nocturia 04/30/2014  . Prepatellar bursitis of right knee 01/22/2014  . Gout 01/08/2014  . Insomnia 10/29/2013  . Senile cataracts of both eyes 08/22/2013  . 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 Macular degeneration 02/03/2011  . Hyperlipidemia 06/17/2009  . History of arteritis 05/23/2007  . History of suicide attempt- while on high dose steroids. avoid prednisone.  01/19/2007  . Essential hypertension 07/21/2006  . GERD 07/21/2006   Lyndee Hensen, PT, DPT 11:35 AM  04/30/19    [facility] [place], [place], [address] Phone: [phone]   Fax:  [phone]  Name: Kevin Perez MRN: [id number] Date of Birth: 1945-11-05

## 2019-05-01 ENCOUNTER — Ambulatory Visit (INDEPENDENT_AMBULATORY_CARE_PROVIDER_SITE_OTHER): Payer: Medicare Other

## 2019-05-01 ENCOUNTER — Encounter: Payer: Self-pay | Admitting: Family Medicine

## 2019-05-01 ENCOUNTER — Ambulatory Visit (INDEPENDENT_AMBULATORY_CARE_PROVIDER_SITE_OTHER): Payer: Medicare Other | Admitting: Family Medicine

## 2019-05-01 VITALS — BP 118/64 | HR 73 | Temp 98.4°F | Ht 71.0 in | Wt 131.6 lb

## 2019-05-01 DIAGNOSIS — E785 Hyperlipidemia, unspecified: Secondary | ICD-10-CM

## 2019-05-01 DIAGNOSIS — I10 Essential (primary) hypertension: Secondary | ICD-10-CM

## 2019-05-01 DIAGNOSIS — R634 Abnormal weight loss: Secondary | ICD-10-CM

## 2019-05-01 DIAGNOSIS — R3589 Other polyuria: Secondary | ICD-10-CM

## 2019-05-01 DIAGNOSIS — E440 Moderate protein-calorie malnutrition: Secondary | ICD-10-CM | POA: Diagnosis not present

## 2019-05-01 DIAGNOSIS — R358 Other polyuria: Secondary | ICD-10-CM

## 2019-05-01 DIAGNOSIS — R351 Nocturia: Secondary | ICD-10-CM | POA: Diagnosis not present

## 2019-05-01 DIAGNOSIS — R739 Hyperglycemia, unspecified: Secondary | ICD-10-CM | POA: Diagnosis not present

## 2019-05-01 DIAGNOSIS — R748 Abnormal levels of other serum enzymes: Secondary | ICD-10-CM | POA: Diagnosis not present

## 2019-05-01 DIAGNOSIS — N401 Enlarged prostate with lower urinary tract symptoms: Secondary | ICD-10-CM

## 2019-05-01 DIAGNOSIS — R05 Cough: Secondary | ICD-10-CM | POA: Diagnosis not present

## 2019-05-01 DIAGNOSIS — E46 Unspecified protein-calorie malnutrition: Secondary | ICD-10-CM | POA: Insufficient documentation

## 2019-05-01 LAB — POC URINALSYSI DIPSTICK (AUTOMATED)
Bilirubin, UA: NEGATIVE
Blood, UA: NEGATIVE
Glucose, UA: NEGATIVE
Ketones, UA: NEGATIVE
Leukocytes, UA: NEGATIVE
Nitrite, UA: NEGATIVE
Protein, UA: NEGATIVE
Spec Grav, UA: 1.025 (ref 1.010–1.025)
Urobilinogen, UA: 0.2 E.U./dL
pH, UA: 6 (ref 5.0–8.0)

## 2019-05-01 LAB — CBC WITH DIFFERENTIAL/PLATELET
Basophils Absolute: 0 10*3/uL (ref 0.0–0.1)
Basophils Relative: 0.6 % (ref 0.0–3.0)
Eosinophils Absolute: 0.3 10*3/uL (ref 0.0–0.7)
Eosinophils Relative: 4.1 % (ref 0.0–5.0)
HCT: 41.2 % (ref 39.0–52.0)
Hemoglobin: 13.7 g/dL (ref 13.0–17.0)
Lymphocytes Relative: 27.6 % (ref 12.0–46.0)
Lymphs Abs: 1.9 10*3/uL (ref 0.7–4.0)
MCHC: 33.3 g/dL (ref 30.0–36.0)
MCV: 90.5 fl (ref 78.0–100.0)
Monocytes Absolute: 0.5 10*3/uL (ref 0.1–1.0)
Monocytes Relative: 7.3 % (ref 3.0–12.0)
Neutro Abs: 4.1 10*3/uL (ref 1.4–7.7)
Neutrophils Relative %: 60.4 % (ref 43.0–77.0)
Platelets: 237 10*3/uL (ref 150.0–400.0)
RBC: 4.55 Mil/uL (ref 4.22–5.81)
RDW: 15 % (ref 11.5–15.5)
WBC: 6.8 10*3/uL (ref 4.0–10.5)

## 2019-05-01 LAB — COMPREHENSIVE METABOLIC PANEL
ALT: 11 U/L (ref 0–53)
AST: 15 U/L (ref 0–37)
Albumin: 4.3 g/dL (ref 3.5–5.2)
Alkaline Phosphatase: 125 U/L — ABNORMAL HIGH (ref 39–117)
BUN: 17 mg/dL (ref 6–23)
CO2: 30 mEq/L (ref 19–32)
Calcium: 10.1 mg/dL (ref 8.4–10.5)
Chloride: 97 mEq/L (ref 96–112)
Creatinine, Ser: 0.98 mg/dL (ref 0.40–1.50)
GFR: 74.78 mL/min (ref >60.00)
Glucose, Bld: 91 mg/dL (ref 70–99)
Potassium: 4.6 mEq/L (ref 3.5–5.1)
Sodium: 135 mEq/L (ref 135–145)
Total Bilirubin: 0.4 mg/dL (ref 0.2–1.2)
Total Protein: 7.9 g/dL (ref 6.0–8.3)

## 2019-05-01 LAB — HEMOGLOBIN A1C: Hgb A1c MFr Bld: 5.5 % (ref 4.6–6.5)

## 2019-05-01 LAB — SEDIMENTATION RATE: Sed Rate: 69 mm/hr — ABNORMAL HIGH (ref 0–20)

## 2019-05-01 LAB — GAMMA GT: GGT: 13 U/L (ref 7–51)

## 2019-05-01 LAB — C-REACTIVE PROTEIN: CRP: 1.7 mg/dL (ref 0.5–20.0)

## 2019-05-01 LAB — PSA: PSA: 5.53 ng/mL — ABNORMAL HIGH (ref 0.10–4.00)

## 2019-05-01 LAB — TSH: TSH: 1.16 u[IU]/mL (ref 0.35–4.50)

## 2019-05-01 MED ORDER — LOSARTAN POTASSIUM 100 MG PO TABS: 50.0000 mg | ORAL_TABLET | Freq: Every day | ORAL | 3 refills | Status: DC

## 2019-05-01 MED ORDER — TAMSULOSIN HCL 0.4 MG PO CAPS: 0.4000 mg | ORAL_CAPSULE | Freq: Every day | ORAL | 3 refills | Status: DC

## 2019-05-01 NOTE — Patient Instructions (Addendum)
Get stool collection kit to look for blood in stool- pick up from lab  Please stop by lab before you go If you do not have mychart- we will call you about results within 5 business days of [place] receiving them.  If you have mychart- we will send your results within 3 business days of [place] receiving them.  If abnormal or we want to clarify a result, we will call or mychart you to make sure you receive the message.  If you have questions or concerns or don't hear within 5 business days, please send [place] a message or call us.   Lets follow up closely- 1 month to recheck on weight- ok to eat a little less healthy. Continue ensure between meals- still make sure to eat a full meal- ok to increase to 2 a day- I do not want you losing more weight!   I am definitely happy to refer you to sports medicine - you declined for now for ongoing back pain  Cut losartan in half- try flomax for difficulty /frequency of urination. Be really careful about standing up when you start flomax as it can cause lightheadedness with standing so be very careful  Recommended follow up: Return in about 1 month (around 05/31/2019) for follow up- or sooner if needed.

## 2019-05-01 NOTE — Assessment & Plan Note (Signed)
74 year old male with unintentional weight loss along with worsening polyuria/nocturia.  He also has some burning with peeing -Get UA and urine culture -Patient denies any potential exposure to STDs-wife passed in 2019 and he was monogamous with her with no partner since that time.  Did not check gonorrhea or chlamydia as a result.  Also did not check HIV in regards to weight loss -Strongly suspect BPH as the cause of his symptoms of urinary frequency but may be multifactorial-we will start Flomax to see if that at least helps -Reducing losartan in case there is an orthostatic intolerance -We will recheck PSA with weight loss to see if any concerning trend and concern for prostate cancer

## 2019-05-01 NOTE — Progress Notes (Signed)
Phone [phone] In person visit   Subjective:   Kevin Perez is a 74 y.o. year old very pleasant male patient who presents for/with See problem oriented charting Chief Complaint  Patient presents with  . Urinary Frequency    Patient mentioned that over the last couple of months his frequency has gotten worse. He stated that last night he got up 10 times to go the restroom. He mentioned that he feels like liquids runs straight thru him.  He mentioned that he has some burning with urination.    This visit occurred during the SARS-CoV-2 public health emergency.  Safety protocols were in place, including screening questions prior to the visit, additional usage of staff PPE, and extensive cleaning of exam room while observing appropriate contact time as indicated for disinfecting solutions.   Past Medical History-  Patient Active Problem List   Diagnosis Date Noted  . History of suicide attempt- while on high dose steroids. avoid prednisone.  01/19/2007    Priority: High  . Testicular swelling, left 04/30/2014    Priority: Medium  . BPH associated with nocturia 04/30/2014    Priority: Medium  . Gout 01/08/2014    Priority: Medium  . Insomnia 10/29/2013    Priority: Medium  . Hyperlipidemia 06/17/2009    Priority: Medium  . History of arteritis 05/23/2007    Priority: Medium  . Essential hypertension 07/21/2006    Priority: Medium  . Former smoker 04/30/2014    Priority: Low  . Prepatellar bursitis of right knee 01/22/2014    Priority: Low  . GERD 07/21/2006    Priority: Low  . Senile cataracts of both eyes 08/22/2013  . 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 Macular degeneration 02/03/2011    Medications- reviewed and updated Current Outpatient Medications  Medication Sig Dispense Refill  . allopurinol (ZYLOPRIM) 100 MG tablet Take 1 tablet (100 mg total) by mouth daily. 90 tablet 3  . calcium carbonate (TUMS -  DOSED IN MG ELEMENTAL CALCIUM) 500 MG chewable tablet Chew 1 tablet by mouth as needed for indigestion or heartburn.    . clonazePAM (KLONOPIN) 1 MG tablet TAKE 1/2 TO 1 TABLET BY MOUTH EVERY DAY AS NEEDED 15 tablet 1  . indomethacin (INDOCIN) 25 MG capsule TAKE ONE CAPSULE BY MOUTH 3 TIMES A DAY AS NEEDED 60 capsule 1  . losartan (COZAAR) 100 MG tablet Take 0.5 tablets (50 mg total) by mouth daily. 45 tablet 3  . aspirin 81 MG tablet Take 81 mg by mouth daily.      Marland Kitchen atorvastatin (LIPITOR) 40 MG tablet Take 1 tablet (40 mg total) by mouth 3 (three) times a week. (Patient not taking: Reported on 05/01/2019) 90 tablet 3  . tamsulosin (FLOMAX) 0.4 MG CAPS capsule Take 1 capsule (0.4 mg total) by mouth daily. 30 capsule 3   No current facility-administered medications for this visit.     Objective:  BP 118/64   Pulse 73   Temp 98.4 F (36.9 C) (Temporal)   Ht '5\' 11"'$  (1.803 m)   Wt 131 lb 9.6 oz (59.7 kg)   SpO2 98%   BMI 18.35 kg/m  Gen: NAD, resting comfortably  CV: RRR, no obvious murmurs Lungs: nonlabored, normal respiratory rate Abdomen: soft/nondistended  Rectal: normal tone, diffusely enlarged prostate, no masses or tenderness MSK: Thin thighs and arms/muscle wasting    Assessment and Plan   # Urinary frequency in patient with known BPH # weight loss  S: Patient has noted increasing issues with urinary frequency.  He also suffers from nocturia-last night got up 10 times to go to the restroom.  He also has some burning with urination.  He just had labs about 2 months ago-CBC, CMP, PSA largely reassuring.  His PSA was 2.98 but this is pretty stable compared to 2.17 five years ago. Prior had spike but came back down Lab Results  Component Value Date   PSA 2.98 02/26/2019   PSA 2.80 10/12/2017   PSA 4.63 (H) 08/10/2017   In may 2019 he was 152 lbs. He has had gradula weight los since tha time. He was 144 last august and in last 6-8 months has lost 13 lbs. He is trying eat  regularly, drink ensure- his calorie intake has increased but he continues to lose weight.   Not sexually active. Colonoscopy 07/10/13 with 10 year repeat. HCV screen 04/2007- no known exposures since that time.  Wife died in 04-04-17 and she paid bills and made all the meals- so possibly not eating as well   Quit smoking 1991.  A/P: 74 year old male with unintentional weight loss along with worsening polyuria/nocturia.  He also has some burning with peeing -Get UA and urine culture -Patient denies any potential exposure to STDs-wife passed in 04-04-2017 and he was monogamous with her with no partner since that time.  Did not check gonorrhea or chlamydia as a result.  Also did not check HIV in regards to weight loss -Strongly suspect BPH as the cause of his symptoms of urinary frequency but may be multifactorial-we will start Flomax to see if that at least helps -Reducing losartan in case there is an orthostatic intolerance -We will recheck PSA with weight loss to see if any concerning trend and concern for prostate cancer -Update full blood work as below-has had slightly elevated blood sugars in the past so we will check A1c for diabetes -CRP or ESR elevated would consider more advanced investigations  into cancer with CT visualization of abdomen pelvis and chest  -Patient with significant muscle mass reduction and BMI down to 18 level-recommended he continue boost/Ensure-could even increase to twice a day-protein calorie malnutrition noted  #  Right low back pan S: right low back pain is improving after 3-4 visits is improving with visit to PT- he wants to hold off on referral to sports medicine.  X-rays with chronic degenerative changes 03/20/19 but no bony abnormalities/fractures A/P: Lumbar films with degenerative changes and suspect that is the cause of his pain.  I do not want to start with targeted imaging such as CT or MRI of lumbar spine-instead would like to see what is captured on CT of abdomen  pelvis for more complete picture given weight loss  Recommended follow up: 1 month follow-up Future Appointments  Date Time Provider [place]  05/02/2019  9:30 AM Lyndee Hensen, PT [facility]  05/09/2019  9:30 AM Lyndee Hensen, PT [facility]  05/11/2019  8:45 AM Lyndee Hensen, PT [facility]  05/14/2019  9:30 AM Lyndee Hensen, PT [facility]  05/16/2019  9:30 AM Lyndee Hensen, PT [facility]  08/27/2019 10:40 AM Yong Channel Brayton Mars, MD [facility]   Lab/Order associations:   ICD-10-CM   1. Unintentional weight loss  R63.4 CBC with Differential/Platelet    Comprehensive metabolic panel    TSH    Sedimentation rate    C-reactive protein    Hemoglobin A1c    DG Chest 2 View    Fecal  occult blood, imunochemical    PSA  2. Polyuria  R35.8 POCT Urinalysis Dipstick (Automated)    Urine Culture  3. Hyperlipidemia, unspecified hyperlipidemia type  E78.5 CBC with Differential/Platelet    Comprehensive metabolic panel    TSH  4. BPH associated with nocturia  N40.1 POCT Urinalysis Dipstick (Automated)   R35.1 Urine Culture    PSA  5. Elevated alkaline phosphatase level  R74.8 Gamma GT  6. Hyperglycemia  R73.9 Hemoglobin A1c  7. Essential hypertension  I10     Meds ordered this encounter  Medications  . losartan (COZAAR) 100 MG tablet    Sig: Take 0.5 tablets (50 mg total) by mouth daily.    Dispense:  45 tablet    Refill:  3  . tamsulosin (FLOMAX) 0.4 MG CAPS capsule    Sig: Take 1 capsule (0.4 mg total) by mouth daily.    Dispense:  30 capsule    Refill:  3   Time Spent: 31 minutes of total time (2:05 PM- 2:36 PM) was spent on the date of the encounter performing the following actions: chart review prior to seeing the patient, obtaining history, performing a medically necessary exam, counseling on the treatment plan, placing orders, and documenting in our EHR.   Return precautions advised.  [name], MD

## 2019-05-01 NOTE — Assessment & Plan Note (Signed)
Losartan 100mg --> 50mg  with weight loss and trial flomax 05/01/19

## 2019-05-02 ENCOUNTER — Encounter: Payer: Self-pay | Admitting: Physical Therapy

## 2019-05-02 ENCOUNTER — Other Ambulatory Visit: Payer: Self-pay

## 2019-05-02 ENCOUNTER — Ambulatory Visit (INDEPENDENT_AMBULATORY_CARE_PROVIDER_SITE_OTHER): Payer: Medicare Other | Admitting: Physical Therapy

## 2019-05-02 DIAGNOSIS — M545 Low back pain, unspecified: Secondary | ICD-10-CM

## 2019-05-02 DIAGNOSIS — R972 Elevated prostate specific antigen [PSA]: Secondary | ICD-10-CM

## 2019-05-02 DIAGNOSIS — M6281 Muscle weakness (generalized): Secondary | ICD-10-CM

## 2019-05-02 LAB — URINE CULTURE
MICRO NUMBER:: 10357440
Result:: NO GROWTH
SPECIMEN QUALITY:: ADEQUATE

## 2019-05-02 NOTE — Addendum Note (Signed)
Addended by: Francis Dowse T on: 05/02/2019 02:31 PM   Modules accepted: Orders

## 2019-05-02 NOTE — Progress Notes (Signed)
Notes from Dr. Yong Channel There is some scarring in the left midlung-possibly from a prior infection.  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  There are some changes in the thoracic spine which is the mid back-depending on your work-up and your weight trend I may get a CT of this area in the future.

## 2019-05-02 NOTE — Therapy (Signed)
[facility] [address] [place], [place], [address] Phone: [phone]   Fax:  [phone]  Physical Therapy Treatment  Patient Details  Name: Kevin Perez MRN: [id number] Date of Birth: [birth year] Referring Provider (PT): Lynne Leader   Encounter Date: 05/02/2019  PT End of Session - 05/02/19 1143    Visit Number  6    Number of Visits  12    Date for PT Re-Evaluation  05/28/19    Authorization Type  Medicare    PT Start Time  0932    PT Stop Time  1017    PT Time Calculation (min)  45 min    Activity Tolerance  Patient tolerated treatment well    Behavior During Therapy  [facility] for tasks assessed/performed       Past Medical History:  Diagnosis Date  . GERD 07/21/2006  . Gout   . HYPERLIPIDEMIA 06/17/2009  . HYPERTENSION 07/21/2006  . Raynaud's syndrome 07/21/2006  . Suicide attempt [facility])    Due to Prednisone!  Marland Kitchen VASCULITIS 05/23/2007    Past Surgical History:  Procedure Laterality Date  . HERNIA REPAIR  [id number]   umbilical hernia  . INGUINAL HERNIA REPAIR     Bil    There were no vitals filed for this visit.  Subjective Assessment - 05/02/19 1119    Subjective  Pt states less pain in back, but does have pain in hips. States most pain in hips when he is rolling over in bed. Also still having much difficulty with standing more than 15 min, feels legs and hips get very weak.    Currently in Pain?  Yes    Pain Score  4     Pain Location  Back    Pain Orientation  Right;Left    Pain Descriptors / Indicators  Aching    Pain Type  Acute pain    Pain Radiating Towards  into Bil hips    Pain Onset  More than a month ago    Pain Frequency  Intermittent                       [facility] Adult PT Treatment/Exercise - 05/02/19 0952      Posture/Postural Control   Posture Comments  Significant fwd flexion in spine, moderate L trunk SB (scoliosis),       Exercises   Exercises  Lumbar      Lumbar Exercises: Stretches    Single Knee to Chest Stretch Limitations  manual    Piriformis Stretch  30 seconds;2 reps    Piriformis Stretch Limitations  manual      Lumbar Exercises: Aerobic   Recumbent Bike  L1 x 8 min;       Lumbar Exercises: Standing   Heel Raises  --    Other Standing Lumbar Exercises  --      Lumbar Exercises: Seated   Long Arc Quad on Chair  20 reps;Both    LAQ on Chair Weights (lbs)  2    Other Seated Lumbar Exercises  Hamstring curl RTB X15 bil;       Lumbar Exercises: Supine   Clam  20 reps    Clam Limitations  GTB    Bent Knee Raise  20 reps      Manual Therapy   Manual Therapy  Soft tissue mobilization;Manual Traction;Passive ROM    Soft tissue mobilization  STM/tennis ball  to R glute , quad and hip  Passive ROM  manual stretch for R hip flexor and bil hip ER;     Manual Traction  long leg distraction x2 min bil for back;                PT Short Term Goals - 04/16/19 1242      PT SHORT TERM GOAL #1   Title  Pt to be independent with initial HEP    Time  2    Period  Weeks    Status  New    Target Date  04/30/19      PT SHORT TERM GOAL #2   Title  Pt to report decreased pain in R side low back to 5/10 with standing activity    Time  2    Period  Weeks    Status  New    Target Date  04/30/19      PT SHORT TERM GOAL #3   Title  Pt to demo log roll for supine to sit transfer for back pain    Time  2    Period  Weeks    Status  New    Target Date  04/30/19        PT Long Term Goals - 04/16/19 1243      PT LONG TERM GOAL #1   Title  Pt to report decreased pain in R low back to 0-2/10 with standing and activity.    Time  6    Period  Weeks    Status  New    Target Date  05/28/19      PT LONG TERM GOAL #2   Title  Pt to report improved LE strength of hips and knees to at least 4+/5 to improve ability for functional activity    Time  6    Period  Weeks    Status  New    Target Date  05/28/19      PT LONG TERM GOAL #3   Title  Pt to be  independent wtih final HEP for back and LE 6    Period  Weeks    Status  New    Target Date  05/28/19      PT LONG TERM GOAL #4   Title  Pt to demo proper lift, squat, bend technique , to improve back pain with IADLS.    Time  6    Period  Weeks    Status  New    Target Date  05/28/19            Plan - 05/02/19 1147    Clinical Impression Statement  Pt continues to have quite a bit of pain with standing. Expect from compression in lumbar spine, with his scoliosis and likely stenosis. Pt with improving soreness in back, but pain in hips is bothersome at night. Pt requires max cuing for ther ex . Will see pt next week, and decide d/c plan vs return to MD.    Personal Factors and Comorbidities  Comorbidity 1    Comorbidities  Significant degenerative findings on x-ray    Examination-Activity Limitations  Locomotion Level;Squat;Stand;Lift    Examination-Participation Restrictions  Meal Prep;Cleaning;Community Activity;Shop;Laundry;Yard Work    Stability/Clinical Decision Making  Stable/Uncomplicated    Rehab Potential  Fair    PT Frequency  2x / week    PT Duration  6 weeks    PT Treatment/Interventions  ADLs/Self Care Home Management;Cryotherapy;Electrical Stimulation;Gait training;DME Instruction;Ultrasound;Traction;Moist Heat;Iontophoresis 4mg /ml Dexamethasone;Stair training;Functional mobility training;Therapeutic activities;Therapeutic exercise;Balance  training;Neuromuscular re-education;Manual techniques;Orthotic Fit/Training;Patient/family education;Passive range of motion;Dry needling;Taping;Joint Manipulations;Spinal Manipulations;Vestibular;Vasopneumatic Device    Consulted and Agree with Plan of Care  Patient       Patient will benefit from skilled therapeutic intervention in order to improve the following deficits and impairments:  Decreased range of motion, Increased muscle spasms, Decreased activity tolerance, Pain, Improper body mechanics, Impaired flexibility, Decreased  mobility, Decreased strength, Postural dysfunction, Hypomobility  Visit Diagnosis: Acute bilateral low back pain without sciatica  Muscle weakness (generalized)     Problem List Patient Active Problem List   Diagnosis Date Noted  . Protein-calorie malnutrition ([place]) 05/01/2019  . Former smoker 04/30/2014  . Testicular swelling, left 04/30/2014  . BPH associated with nocturia 04/30/2014  . Prepatellar bursitis of right knee 01/22/2014  . Gout 01/08/2014  . Insomnia 10/29/2013  . Senile cataracts of both eyes 08/22/2013  . 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 Macular degeneration 02/03/2011  . Hyperlipidemia 06/17/2009  . History of arteritis 05/23/2007  . History of suicide attempt- while on high dose steroids. avoid prednisone.  01/19/2007  . Essential hypertension 07/21/2006  . GERD 07/21/2006    Lyndee Hensen, PT, DPT 11:53 AM  05/02/19    [facility] [place], [place], [address] Phone: [phone]   Fax:  [phone]  Name: Kevin Perez MRN: [id number] Date of Birth: 1945-09-25

## 2019-05-03 ENCOUNTER — Telehealth: Payer: Self-pay | Admitting: Family Medicine

## 2019-05-03 ENCOUNTER — Other Ambulatory Visit: Payer: Self-pay

## 2019-05-03 DIAGNOSIS — R3589 Other polyuria: Secondary | ICD-10-CM

## 2019-05-03 DIAGNOSIS — R972 Elevated prostate specific antigen [PSA]: Secondary | ICD-10-CM

## 2019-05-03 DIAGNOSIS — M545 Low back pain, unspecified: Secondary | ICD-10-CM

## 2019-05-03 DIAGNOSIS — R358 Other polyuria: Secondary | ICD-10-CM

## 2019-05-03 LAB — FECAL OCCULT BLOOD, IMMUNOCHEMICAL: Fecal Occult Bld: NEGATIVE

## 2019-05-03 NOTE — Telephone Encounter (Signed)
Patient is calling saying he was told to go to a Urinologist, and wanted to know if he could have a referral to [facility] in [place] .

## 2019-05-03 NOTE — Telephone Encounter (Signed)
[facility] spoke with pt and provided results and placed referral.

## 2019-05-03 NOTE — Telephone Encounter (Signed)
Patient would also like to know his test results from his appointment.

## 2019-05-04 ENCOUNTER — Telehealth: Payer: Self-pay | Admitting: Family Medicine

## 2019-05-04 ENCOUNTER — Telehealth: Payer: Self-pay

## 2019-05-04 NOTE — Telephone Encounter (Signed)
Patient is calling in stating he got an appointment with the urinologist for 5/17 but they had asked patient to contact us, they are requesting notes before his appointment,

## 2019-05-04 NOTE — Telephone Encounter (Signed)
Notes have been sent per stephanie.

## 2019-05-04 NOTE — Telephone Encounter (Signed)
error 

## 2019-05-07 ENCOUNTER — Encounter: Payer: Medicare Other | Admitting: Physical Therapy

## 2019-05-07 ENCOUNTER — Other Ambulatory Visit: Payer: Self-pay

## 2019-05-07 ENCOUNTER — Ambulatory Visit (INDEPENDENT_AMBULATORY_CARE_PROVIDER_SITE_OTHER): Payer: Medicare Other | Admitting: Family Medicine

## 2019-05-07 ENCOUNTER — Encounter: Payer: Self-pay | Admitting: Family Medicine

## 2019-05-07 VITALS — BP 130/80 | HR 80 | Ht 71.0 in | Wt 131.0 lb

## 2019-05-07 DIAGNOSIS — G8929 Other chronic pain: Secondary | ICD-10-CM | POA: Diagnosis not present

## 2019-05-07 DIAGNOSIS — Z9151 Personal history of suicidal behavior: Secondary | ICD-10-CM

## 2019-05-07 DIAGNOSIS — Z915 Personal history of self-harm: Secondary | ICD-10-CM | POA: Diagnosis not present

## 2019-05-07 DIAGNOSIS — M5441 Lumbago with sciatica, right side: Secondary | ICD-10-CM

## 2019-05-07 DIAGNOSIS — M5442 Lumbago with sciatica, left side: Secondary | ICD-10-CM

## 2019-05-07 NOTE — Patient Instructions (Addendum)
Thank you for coming in today. Plan for MRI.  Recheck with me after MRI.  Ok to use voltaren and biofreeze.  Let me know if you do not get a call about scheduling the MRI by the end of the week.  [address] [place], [place] [address] [phone]
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
## 2019-05-09 ENCOUNTER — Ambulatory Visit (INDEPENDENT_AMBULATORY_CARE_PROVIDER_SITE_OTHER): Payer: Medicare Other | Admitting: Physical Therapy

## 2019-05-09 ENCOUNTER — Other Ambulatory Visit: Payer: Self-pay

## 2019-05-09 ENCOUNTER — Telehealth: Payer: Self-pay | Admitting: Family Medicine

## 2019-05-09 ENCOUNTER — Encounter: Payer: Self-pay | Admitting: Physical Therapy

## 2019-05-09 DIAGNOSIS — M545 Low back pain, unspecified: Secondary | ICD-10-CM

## 2019-05-09 DIAGNOSIS — M6281 Muscle weakness (generalized): Secondary | ICD-10-CM | POA: Diagnosis not present

## 2019-05-09 MED ORDER — LORAZEPAM 0.5 MG PO TABS: ORAL_TABLET | ORAL | 0 refills | Status: DC

## 2019-05-09 NOTE — Telephone Encounter (Signed)
Pt seen yesterday and is awaiting an MRI. Per pt, Dr. Georgina Snell discussed giving him something to "calm him" for the MRI. Pt denied this during visit, but has rethought this and would like something call in for the procedure.  [facility]  Pt [phone], please call pt with outcome

## 2019-05-09 NOTE — Therapy (Signed)
[facility] [address] [place], [place], [address] Phone: [phone]   Fax:  [phone]  Physical Therapy Treatment  Patient Details  Name: Kevin Perez MRN: [id number] Date of Birth: 1945-06-29 Referring Provider (PT): Lynne Leader   Encounter Date: 05/09/2019  PT End of Session - 05/09/19 1039    Visit Number  7    Number of Visits  12    Date for PT Re-Evaluation  05/28/19    Authorization Type  Medicare    PT Start Time  0935    PT Stop Time  1025    PT Time Calculation (min)  50 min    Activity Tolerance  Patient tolerated treatment well    Behavior During Therapy  [facility] for tasks assessed/performed       Past Medical History:  Diagnosis Date  . GERD 07/21/2006  . Gout   . HYPERLIPIDEMIA 06/17/2009  . HYPERTENSION 07/21/2006  . Raynaud's syndrome 07/21/2006  . Suicide attempt [facility])    Due to Prednisone!  Marland Kitchen VASCULITIS 05/23/2007    Past Surgical History:  Procedure Laterality Date  . HERNIA REPAIR  [id number]   umbilical hernia  . INGUINAL HERNIA REPAIR     Bil    There were no vitals filed for this visit.  Subjective Assessment - 05/09/19 1038    Subjective  Pt states continued pain.    Currently in Pain?  Yes    Pain Location  Hip    Pain Orientation  Left;Right    Pain Descriptors / Indicators  Aching    Pain Type  Acute pain    Pain Onset  More than a month ago    Pain Frequency  Intermittent                       [facility] Adult PT Treatment/Exercise - 05/09/19 0001      Lumbar Exercises: Stretches   Active Hamstring Stretch  3 reps;30 seconds    Active Hamstring Stretch Limitations  seated    Piriformis Stretch  30 seconds;2 reps    Piriformis Stretch Limitations  seated      Lumbar Exercises: Aerobic   Recumbent Bike  L1 x 8 min;       Lumbar Exercises: Standing   Other Standing Lumbar Exercises  March x20: Hip abd x20 bil;       Lumbar Exercises: Supine   Clam  20 reps    Clam  Limitations  GTB      Manual Therapy   Manual Therapy  Joint mobilization    Joint Mobilization  Inf hip mobs bil gr 3 ;  S/L lumbar decompression mobs, light gr 2/3.     Passive ROM  manual stretch for Bil hip flexor and quad,     Manual Traction  long leg distraction for lumbar x2 min;              PT Education - 05/09/19 1039    Education Details  Reviewed HEP today    Person(s) Educated  Patient    Methods  Explanation;Demonstration;Tactile cues;Handout;Verbal cues    Comprehension  Verbalized understanding;Returned demonstration;Verbal cues required;Tactile cues required;Need further instruction       PT Short Term Goals - 04/16/19 1242      PT SHORT TERM GOAL #1   Title  Pt to be independent with initial HEP    Time  2    Period  Weeks    Status  New    Target Date  04/30/19      PT SHORT TERM GOAL #2   Title  Pt to report decreased pain in R side low back to 5/10 with standing activity    Time  2    Period  Weeks    Status  New    Target Date  04/30/19      PT SHORT TERM GOAL #3   Title  Pt to demo log roll for supine to sit transfer for back pain    Time  2    Period  Weeks    Status  New    Target Date  04/30/19        PT Long Term Goals - 04/16/19 1243      PT LONG TERM GOAL #1   Title  Pt to report decreased pain in R low back to 0-2/10 with standing and activity.    Time  6    Period  Weeks    Status  New    Target Date  05/28/19      PT LONG TERM GOAL #2   Title  Pt to report improved LE strength of hips and knees to at least 4+/5 to improve ability for functional activity    Time  6    Period  Weeks    Status  New    Target Date  05/28/19      PT LONG TERM GOAL #3   Title  Pt to be independent wtih final HEP for back and LE 6    Period  Weeks    Status  New    Target Date  05/28/19      PT LONG TERM GOAL #4   Title  Pt to demo proper lift, squat, bend technique , to improve back pain with IADLS.    Time  6    Period  Weeks     Status  New    Target Date  05/28/19            Plan - 05/09/19 1041    Clinical Impression Statement  Pt to be seen for one additional visit, for pain relief, and to finalize HEP. Then will hold until after pts MRI. PT continues to have signfiicant pain and weakness feeling in legs. Pt req max cuing to perform ther ex correctly.    Personal Factors and Comorbidities  Comorbidity 1    Comorbidities  Significant degenerative findings on x-ray    Examination-Activity Limitations  Locomotion Level;Squat;Stand;Lift    Examination-Participation Restrictions  Meal Prep;Cleaning;Community Activity;Shop;Laundry;Yard Work    Stability/Clinical Decision Making  Stable/Uncomplicated    Rehab Potential  Fair    PT Frequency  2x / week    PT Duration  6 weeks    PT Treatment/Interventions  ADLs/Self Care Home Management;Cryotherapy;Electrical Stimulation;Gait training;DME Instruction;Ultrasound;Traction;Moist Heat;Iontophoresis 4mg /ml Dexamethasone;Stair training;Functional mobility training;Therapeutic activities;Therapeutic exercise;Balance training;Neuromuscular re-education;Manual techniques;Orthotic Fit/Training;Patient/family education;Passive range of motion;Dry needling;Taping;Joint Manipulations;Spinal Manipulations;Vestibular;Vasopneumatic Device    Consulted and Agree with Plan of Care  Patient       Patient will benefit from skilled therapeutic intervention in order to improve the following deficits and impairments:  Decreased range of motion, Increased muscle spasms, Decreased activity tolerance, Pain, Improper body mechanics, Impaired flexibility, Decreased mobility, Decreased strength, Postural dysfunction, Hypomobility  Visit Diagnosis: Acute bilateral low back pain without sciatica  Muscle weakness (generalized)     Problem List Patient Active Problem List   Diagnosis Date Noted  . Protein-calorie malnutrition ([place])  05/01/2019  . Former smoker 04/30/2014  . Testicular  swelling, left 04/30/2014  . BPH associated with nocturia 04/30/2014  . Prepatellar bursitis of right knee 01/22/2014  . Gout 01/08/2014  . Insomnia 10/29/2013  . Senile cataracts of both eyes 08/22/2013  . 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 Macular degeneration 02/03/2011  . Hyperlipidemia 06/17/2009  . History of arteritis 05/23/2007  . History of suicide attempt- while on high dose steroids. avoid prednisone.  01/19/2007  . Essential hypertension 07/21/2006  . GERD 07/21/2006    Lyndee Hensen, PT, DPT 10:48 AM  05/09/19    [facility] [place], [place], [address] Phone: [phone]   Fax:  [phone]  Name: Kevin Perez MRN: [id number] Date of Birth: 07-Nov-1945

## 2019-05-09 NOTE — Telephone Encounter (Signed)
Ativan prescribed.  Take 30 to 60 minutes prior to MRI.  Do not drive after taking this medication.

## 2019-05-10 NOTE — Telephone Encounter (Signed)
Called pt and informed him of the Ativan rx w/ Dr. Clovis Riley instructions.  Pt verbalizes understanding.

## 2019-05-11 ENCOUNTER — Ambulatory Visit (INDEPENDENT_AMBULATORY_CARE_PROVIDER_SITE_OTHER): Payer: Medicare Other | Admitting: Physical Therapy

## 2019-05-11 ENCOUNTER — Encounter: Payer: Self-pay | Admitting: Physical Therapy

## 2019-05-11 ENCOUNTER — Other Ambulatory Visit: Payer: Self-pay

## 2019-05-11 DIAGNOSIS — M545 Low back pain, unspecified: Secondary | ICD-10-CM

## 2019-05-11 DIAGNOSIS — M6281 Muscle weakness (generalized): Secondary | ICD-10-CM

## 2019-05-11 NOTE — Therapy (Addendum)
Kevin Perez [address] [place], [place], [address] Phone: [phone]   Fax:  [phone]  Physical Therapy Treatment  PHYSICAL THERAPY DISCHARGE SUMMARY  Visits from Start of Care: 8  Current functional level related to goals / functional outcomes: Unable to assess due to unplanned discharge  Remaining deficits: Unable to assess due to unplanned discharge  Education / Equipment: Unable to assess due to unplanned discharge   Patient agrees to discharge. Patient goals were not met. Patient is being discharged due to not returning since the last visit.  Signing therapist has never treated or seen patient, but is discharging patient on behalf of last treating therapist secondary to long standing open episode that needs closing.  3:00 PM, 01/29/21 Kevin Perez, DPT Physical Therapy with [facility]  [phone] office   Patient Details  Name: Kevin Perez MRN: [id number] Date of Birth: December 12, 1945 Referring Provider (PT): Lynne Leader   Encounter Date: 05/11/2019  PT End of Session - 05/11/19 1544     Visit Number  8    Number of Visits  12    Date for PT Re-Evaluation  05/28/19    Authorization Type  Medicare    PT Start Time  0845    PT Stop Time  0926    PT Time Calculation (min)  41 min    Activity Tolerance  Patient tolerated treatment well    Behavior During Therapy  [facility] for tasks assessed/performed        Past Medical History:  Diagnosis Date   GERD 07/21/2006   Gout    HYPERLIPIDEMIA 06/17/2009   HYPERTENSION 07/21/2006   Raynaud's syndrome 07/21/2006   Suicide attempt [facility])    Due to Prednisone!   VASCULITIS 05/23/2007    Past Surgical History:  Procedure Laterality Date   HERNIA REPAIR  08/20/78   umbilical hernia   INGUINAL HERNIA REPAIR     Bil    There were no vitals filed for this visit.  Subjective Assessment - 05/11/19 1542     Subjective  Pt states less pain in thighs  today, mild pain in back.    Currently in Pain?  Yes    Pain Score  4     Pain Location  Back    Pain Orientation  Right;Left    Pain Descriptors / Indicators  Aching    Pain Type  Acute pain    Pain Onset  More than a month ago    Pain Frequency  Intermittent                        [facility] Adult PT Treatment/Exercise - 05/11/19 0851       Lumbar Exercises: Stretches   Active Hamstring Stretch  3 reps;30 seconds    Active Hamstring Stretch Limitations  seated    Piriformis Stretch  30 seconds;2 reps    Piriformis Stretch Limitations  supine       Lumbar Exercises: Aerobic   Recumbent Bike  L1 x 8 min;       Lumbar Exercises: Standing   Other Standing Lumbar Exercises  March x20: Hip abd x20 bil;       Lumbar Exercises: Seated   Long Arc Quad on Chair  20 reps;Both    LAQ on Chair Weights (lbs)  2      Lumbar Exercises: Supine   Clam  20 reps    Clam Limitations  GTB  Manual Therapy   Manual Therapy  Joint mobilization    Joint Mobilization  Inf hip mobs bil gr 3 ;    Passive ROM  manual stretch for Bil hip flexor and quad, supine and s/l.     Manual Traction  --              PT Education - 05/11/19 1543     Education Details  Reviewed HEP    Person(s) Educated  Patient    Methods  Explanation;Demonstration;Verbal cues    Comprehension  Verbalized understanding;Returned demonstration        PT Short Term Goals - 05/11/19 1544       PT SHORT TERM GOAL #1   Title  Pt to be independent with initial HEP    Time  2    Period  Weeks    Status  Achieved    Target Date  04/30/19      PT SHORT TERM GOAL #2   Title  Pt to report decreased pain in R side low back to 5/10 with standing activity    Time  2    Period  Weeks    Status  Partially Met    Target Date  04/30/19      PT SHORT TERM GOAL #3   Title  Pt to demo log roll for supine to sit transfer for back pain    Time  2    Period  Weeks    Status  Achieved    Target Date   04/30/19         PT Long Term Goals - 05/11/19 1544       PT LONG TERM GOAL #1   Title  Pt to report decreased pain in R low back to 0-2/10 with standing and activity.    Time  6    Period  Weeks    Status  On-going      PT LONG TERM GOAL #2   Title  Pt to report improved LE strength of hips and knees to at least 4+/5 to improve ability for functional activity    Time  6    Period  Weeks    Status  Partially Met      PT LONG TERM GOAL #3   Title  Pt to be independent wtih final HEP for back and LE    Period  Weeks    Status  Achieved      PT LONG TERM GOAL #4   Title  Pt to demo proper lift, squat, bend technique , to improve back pain with IADLS.    Baseline  * Pt req verbal cues    Time  6    Period  Weeks    Status  Partially Met             Plan - 05/11/19 1546     Clinical Impression Statement  Added hip flexor stretching to HEP today. Pt continues to have variable pain in back and LEs, has not had significant pain relief.  Will hold until after MRI and MD follow up. Final HEP reviewed again today.    Personal Factors and Comorbidities  Comorbidity 1    Comorbidities  Significant degenerative findings on x-ray    Examination-Activity Limitations  Locomotion Level;Squat;Stand;Lift    Examination-Participation Restrictions  Meal Prep;Cleaning;Community Activity;Shop;Laundry;Yard Work    Stability/Clinical Decision Making  Stable/Uncomplicated    Rehab Potential  Fair    PT Frequency  2x / week  PT Duration  6 weeks    PT Treatment/Interventions  ADLs/Self Care Home Management;Cryotherapy;Electrical Stimulation;Gait training;DME Instruction;Ultrasound;Traction;Moist Heat;Iontophoresis '4mg'$ /ml Dexamethasone;Stair training;Functional mobility training;Therapeutic activities;Therapeutic exercise;Balance training;Neuromuscular re-education;Manual techniques;Orthotic Fit/Training;Patient/family education;Passive range of motion;Dry needling;Taping;Joint  Manipulations;Spinal Manipulations;Vestibular;Vasopneumatic Device    Consulted and Agree with Plan of Care  Patient        Patient will benefit from skilled therapeutic intervention in order to improve the following deficits and impairments:  Decreased range of motion, Increased muscle spasms, Decreased activity tolerance, Pain, Improper body mechanics, Impaired flexibility, Decreased mobility, Decreased strength, Postural dysfunction, Hypomobility  Visit Diagnosis: Acute bilateral low back pain without sciatica  Muscle weakness (generalized)     Problem List Patient Active Problem List   Diagnosis Date Noted   Protein-calorie malnutrition ([place]) 05/01/2019   Former smoker 04/30/2014   Testicular swelling, left 04/30/2014   BPH associated with nocturia 04/30/2014   Prepatellar bursitis of right knee 01/22/2014   Gout 01/08/2014   Insomnia 10/29/2013   Senile cataracts of both eyes 08/22/2013   Glaucoma suspect of both eyes 07/03/2012   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 avoid prednisone.  01/19/2007   Essential hypertension 07/21/2006   GERD 07/21/2006    Lyndee Hensen, PT, DPT 3:47 PM  05/11/19    [place], [place], [address] Phone: [phone]   Fax:  [phone]  Name: MUREL SHENBERGER MRN: [id number] Date of Birth: 1945/04/28

## 2019-05-14 ENCOUNTER — Encounter: Payer: Medicare Other | Admitting: Physical Therapy

## 2019-05-15 ENCOUNTER — Telehealth (INDEPENDENT_AMBULATORY_CARE_PROVIDER_SITE_OTHER): Payer: Medicare Other | Admitting: Family Medicine

## 2019-05-15 ENCOUNTER — Other Ambulatory Visit: Payer: Self-pay

## 2019-05-15 DIAGNOSIS — K5900 Constipation, unspecified: Secondary | ICD-10-CM | POA: Diagnosis not present

## 2019-05-15 DIAGNOSIS — G8929 Other chronic pain: Secondary | ICD-10-CM

## 2019-05-15 DIAGNOSIS — M545 Low back pain: Secondary | ICD-10-CM

## 2019-05-15 DIAGNOSIS — M79605 Pain in left leg: Secondary | ICD-10-CM | POA: Diagnosis not present

## 2019-05-15 DIAGNOSIS — M25559 Pain in unspecified hip: Secondary | ICD-10-CM | POA: Diagnosis not present

## 2019-05-15 NOTE — Progress Notes (Signed)
Virtual Visit via Telephone Note  I connected with Kevin Perez on 05/15/19 at  1:20 PM EDT by telephone and verified that I am speaking with the correct person using two identifiers.   I discussed the limitations, risks, security and privacy concerns of performing an evaluation and management service by telephone and the availability of in person appointments. I also discussed with the patient that there may be a patient responsible charge related to this service. The patient expressed understanding and agreed to proceed.  Location patient: home Location provider: work or home office Participants present for the call: patient, provider Patient did not have a visit in the prior 7 days to address this/these issue(s).   History of Present Illness:  Acute visit for L leg discomfort: -reports back issues started several months ago and now pain has spread to L hip/ leg discomfort for 1 month -pain is located in the lateral hip, and in the front and back of the leg from the upper leg to just below the knee -is a "sandpaper" type of pain -he is seeing sports medicine and his PCP about this -has done PT and now has MRI scheduled later this week -reports pain is relieved by Tylenol, but he never took tylenol in his life until these issues started and he was afraid to keep using tylenol so stopped it a few days ago -he uses heat and biofreeze which help a little -he wonders what to use until the MRI -he does feel like ibuprofen helps -his sports medicine doc told him to use voltaren -he has elevated PSA and is very worried this could be prostate ca - is seeing specialist for this soon -he also struggles with constipation and wonders if the tylenol contributes to this -denies fevers, malaise, weakness, numbness, B/B incont -reports had this same issue in the past and was hospitalized and given prednisone which fixed the leg issues - but caused severe psychiatric side effects  Received a  mychart message from PT during the telemedicine visit: Hi Dr. Maudie Mercury,  Just a heads up for the pt your seeing today. I have been seeing him for PT, I just put him on hold bc his pain is not significantly better, and he has an MRI scheduled. My guess is he has signfiicant degeneration, scoliosis, stenosis,that is causing compression and LE symptoms. Pain has been variable. He was here last week and doing pretty well with only mild pain. Increased pain with standing/walking. Hes seeing Dr. Georgina Snell (sports med) for back as well. Think he called our office today in significant pain, and wanted to be seen.   Thanks !  Lyndee Hensen, PT, DPT   Plain films 3/21 in chart  CLINICAL DATA:  Low back pain, no known injury, initial encounter  EXAM: LUMBAR SPINE - COMPLETE 4+ VIEW  COMPARISON:  None.  FINDINGS: Five lumbar type vertebral bodies are well visualized. Chronic compression deformities involving L1, L3, L4 and L5 are noted. Multilevel disc space narrowing and osteophytic changes are seen. Facet hypertrophic changes are noted. No soft tissue abnormality is seen.  IMPRESSION: Chronic degenerative changes and multilevel compression deformities without acute abnormality.   Electronically Signed   By: Inez Catalina M.D.   On: 03/20/2019 23:11    Observations/Objective: Patient sounds anxious, but alert and oriented. I do not appreciate any SOB. Speech and thought processing are grossly intact. Patient reported vitals:  Assessment and Plan:  Pain of left lower extremity  Hip pain  Chronic left-sided low  back pain, unspecified whether sciatica present  Constipation, unspecified constipation type  -we discussed possible serious and likely etiologies, options for evaluation and workup, limitations of telemedicine visit vs in person visit, treatment, treatment risks and precautions. Query radicular symptoms for DDD or othe musculoskeletal d/o vs other. He is worried about cancer  and will be seeing specialist, PCP and having MRI soon. Advised will forward my notes from today to his PCP as he was hoping for an appointment with him today. He has opted to await the MRI results for next steps, which is scheduled in a few days, as does not feel symptoms have worsened significantly the last month or that he has severe pain requiring an urgent inperson or ER visit. In the interim he is going to use tylenol in safe dosing amounts as needed as he feels it is the only thing that helps much, along with the menthol and heat. He also may try voltaren that sports medicin advised. Discussed options for constipation and will using mirilax 1-2 times daily until soft BMs for several days and follow up with PCP if persists. Patient agrees to follow up with PCP, sports med and/or seek prompt in person care if worsening, new symptoms arise, or if is not improving with treatment.  Follow Up Instructions:  I did not refer this patient for an OV in the next 24 hours for this/these issue(s).  I discussed the assessment and treatment plan with the patient. The patient was provided an opportunity to ask questions and all were answered. The patient agreed with the plan and demonstrated an understanding of the instructions.   The patient was advised to call back or seek an in-person evaluation if the symptoms worsen or if the condition fails to improve as anticipated.  I provided 25 minutes of non-face-to-face time during this encounter.   Lucretia Kern, DO

## 2019-05-16 ENCOUNTER — Encounter: Payer: Medicare Other | Admitting: Physical Therapy

## 2019-05-16 ENCOUNTER — Telehealth: Payer: Self-pay | Admitting: Family Medicine

## 2019-05-16 MED ORDER — TRAMADOL HCL 50 MG PO TABS: 50.0000 mg | ORAL_TABLET | Freq: Three times a day (TID) | ORAL | 0 refills | Status: DC | PRN

## 2019-05-16 NOTE — Telephone Encounter (Signed)
Pt called, has MRI scheduled for Saturday. He is really struggling with pain, cannot leave the house or really do anything. Using 3000 mg of tylenol per day, but is requesting something that might work better. [facility] in [place]. Pt available on cell.

## 2019-05-16 NOTE — Telephone Encounter (Signed)
Called pt and informed him of Dr. Clovis Riley message regarding Tramadol.

## 2019-05-16 NOTE — Telephone Encounter (Signed)
Prescribed tramadol.  This is an opiate medication for pain control.  Use it sparingly.  Okay to take with Tylenol.

## 2019-05-17 ENCOUNTER — Other Ambulatory Visit: Payer: Self-pay | Admitting: Family Medicine

## 2019-05-19 ENCOUNTER — Ambulatory Visit (INDEPENDENT_AMBULATORY_CARE_PROVIDER_SITE_OTHER): Payer: Medicare Other

## 2019-05-19 ENCOUNTER — Other Ambulatory Visit: Payer: Self-pay

## 2019-05-19 DIAGNOSIS — M5441 Lumbago with sciatica, right side: Secondary | ICD-10-CM | POA: Diagnosis not present

## 2019-05-19 DIAGNOSIS — M5442 Lumbago with sciatica, left side: Secondary | ICD-10-CM

## 2019-05-19 DIAGNOSIS — G8929 Other chronic pain: Secondary | ICD-10-CM

## 2019-05-19 DIAGNOSIS — M545 Low back pain: Secondary | ICD-10-CM | POA: Diagnosis not present

## 2019-05-21 NOTE — Progress Notes (Signed)
X-ray lumbar spine shows evidence of compression fractures in the past.  Additionally it does show potential bulging disc pressing on nerves causing the pain that you have.  Next step for treatment of this is going to be a steroid injection.  However we need to prepare for that so that you do not have any mental health problems from the steroids with the injection.  Please schedule follow-up appoint with me.

## 2019-05-22 ENCOUNTER — Ambulatory Visit (INDEPENDENT_AMBULATORY_CARE_PROVIDER_SITE_OTHER): Payer: Medicare Other | Admitting: Family Medicine

## 2019-05-22 ENCOUNTER — Encounter: Payer: Self-pay | Admitting: Family Medicine

## 2019-05-22 ENCOUNTER — Other Ambulatory Visit: Payer: Self-pay

## 2019-05-22 VITALS — BP 156/80 | HR 78 | Ht 71.0 in | Wt 131.0 lb

## 2019-05-22 DIAGNOSIS — S32040A Wedge compression fracture of fourth lumbar vertebra, initial encounter for closed fracture: Secondary | ICD-10-CM

## 2019-05-22 NOTE — Progress Notes (Signed)
I, [name], LAT, ATC, am serving as scribe for Dr. Lynne Leader.  Kevin Perez is a 74 y.o. male who presents to [place] [facility] today for f/u of low back and radiating pain into the L leg and L-spine MRI review.  He was last seen by Dr. Georgina Snell on 05/07/19 w/ worsening symptoms into his L LE.  He has completed 2 PT sessions and had an L-spine MRI on 05/19/19.  Since his last visit, pt reports he is still in pain. Pain radiates to the anterior abdomen. Patient states he is really struggling.   Diagnostic imaging: L-spine XR- 03/20/19; L-spine MRI- 05/19/19  Pertinent review of systems: No fevers or chills  Relevant historical information: History suicide attempt in the past thought to be due to steroid exposure.   Exam:  BP (!) 156/80 (BP Location: Left Arm, Patient Position: Sitting)   Pulse 78   Ht 5\' 11"  (1.803 m)   Wt 131 lb (59.4 kg)   SpO2 97%   BMI 18.27 kg/m  General: Well Developed, well nourished, and in no acute distress.   MSK: L-spine normal-appearing mildly tender to palpation midline and paraspinal musculature.  Tender palpation left SI joint. Normal flexion range of motion decreased extension. Lower extremity strength is present and intact.    Lab and Radiology Results No results found for this or any previous visit (from the past 72 hour(s)). MR Lumbar Spine Wo Contrast  Result Date: 05/19/2019 CLINICAL DATA:  Low back pain with lumbar radiculopathy. EXAM: MRI LUMBAR SPINE WITHOUT CONTRAST TECHNIQUE: Multiplanar, multisequence MR imaging of the lumbar spine was performed. No intravenous contrast was administered. COMPARISON:  Lumbar radiographs 03/20/2019 FINDINGS: Segmentation:  Normal Alignment: Mild retrolisthesis L1-2. Reversal of the lumbar lordosis with mild kyphosis. Vertebrae: Chronic appearing compression fracture T12, L1, L2, L3. Moderately severe compression fracture L4 with bone marrow edema consistent with a recent fracture.  L4 fracture has progressed since the prior x-ray study. Conus medullaris and cauda equina: Conus extends to the L1 level. Conus and cauda equina appear normal. Paraspinal and other soft tissues: Negative for paraspinous mass. Left renal cyst. Disc levels: T12-L1: Mild degenerative change without stenosis L1-2: Disc bulging and mild facet degeneration. Mild spinal stenosis and mild subarticular and foraminal stenosis bilaterally L2-3: Diffuse disc bulging with bilateral facet degeneration. Mild spinal stenosis. Subarticular stenosis bilaterally left greater than right L3-4: Disc degeneration with diffuse disc bulging and endplate spurring. Subarticular disc protrusion on the right with impingement of the right L4 nerve root. Severe subarticular and foraminal stenosis bilaterally right greater than left. Moderate spinal stenosis. L4-5: Disc degeneration with disc bulging and diffuse endplate spurring. Severe subarticular and foraminal stenosis bilaterally L5-S1: Disc degeneration with diffuse endplate spurring. There is severe subarticular and foraminal stenosis bilaterally due to spurring. Spinal canal adequate in size. IMPRESSION: 1. Chronic compression fractures T12, L1, L2, L3. Moderately severe compression fracture of L4 with bone month edema consistent with a more recent fracture. This has progressed since the prior radiographic study of 03/20/2019. 2. 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 Electronically Signed   By: Franchot Gallo M.D.   On: 05/19/2019 17:32   I, Lynne Leader, personally (independently) visualized and performed the interpretation of the images attached in this note.     Assessment and Plan: 74 y.o. male with multifactorial back pain.  Pain worsened starting in February.  X-ray and MRI shows multiple  chronic compression fractures L-spine.  He has had progression of compression fracture at L4  since x-ray in March.  I think this is his main pain generator.  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  At this point he is failing conservative management.  We will try proceeding with vertebroplasty or kyphoplasty by referring to Dr. Estanislado Pandy interventional radiology.  Patient is not taking any blood thinner medications at this point should be a good candidate for it.  Caution patient that he may needed a CT scan of his L-spine prior to vertebroplasty or kyphoplasty.  I am not sure if he will or not and will contact Dr. Arlean Hopping office if needed I will be happy to order it ASAP.  He is having quite a bit of pain.  He is only taking 1/2 pills of tramadol at a time.  Advised him to increase to 50 mg at a time and if not sufficient I can change pain management regimen.  Additionally will use a more supportive back brace as this may help some.  Additionally his mood has worsened quite a bit.  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  I do not think it is likely that he will attempt suicide again and he is not actively suicidal today.  However I do think this would benefit with evaluation by PCP.  I reached out and contacted his PCP to reevaluate mental health.    Orders Placed This Encounter  Procedures  . Ambulatory referral to Interventional Radiology    Referral Priority:   Routine    Referral Type:   Consultation    Referral Reason:   Specialty Services Required    Referred to Provider:   Luanne Bras, MD    Requested Specialty:   Interventional Radiology    Number of Visits Requested:   1   No orders of the defined types were placed in this encounter.    Discussed warning signs or symptoms. Please see discharge instructions. Patient expresses understanding.   The above documentation has been reviewed and is accurate and complete Lynne Leader

## 2019-05-22 NOTE — Patient Instructions (Addendum)
Thank you for coming in today. You should hear soon about the back fracture stabilizing injection called Kyphoplasty.  Let me know if you have not heard by the end of the week.  We will also try a back brace which will help a little.  Try taking a full pill of tramadol.  If not enough let me know.   We may need to get a CT scan of the back for the kyphoplasty.    Balloon Kyphoplasty, Care After This sheet gives you information about how to care for yourself after your procedure. Your health care provider may also give you more specific instructions. If you have problems or questions, contact your health care provider. What can I expect after the procedure? After your procedure, it is common to have back pain. Follow these instructions at home: Medicines  Take over-the-counter and prescription medicines only as told by your health care provider.  Ask your health care provider if the medicine prescribed to you: ? Requires you to avoid driving or using heavy machinery. ? Can cause constipation. You may need to take steps to prevent or treat constipation, such as:  Drink enough fluid to keep your urine pale yellow.  Take over-the-counter or prescription medicines.  Eat foods that are high in fiber, such as beans, whole grains, and fresh fruits and vegetables.  Limit foods that are high in fat and processed sugars, such as fried or sweet foods. Puncture site care   Follow instructions from your health care provider about how to take care of your puncture site. Make sure you: ? Wash your hands with soap and water before and after you change your bandage (dressing). If soap and water are not available, use hand sanitizer. ? Change your dressing as told by your health care provider. ? Leave skin glue or adhesive strips in place. These skin closures may need to be in place for 2 weeks or longer. If adhesive strip edges start to loosen and curl up, you may trim the loose edges. Do not  remove adhesive strips completely unless your health care provider tells you to do that.  Check your puncture site every day for signs of infection. Watch for: ? Redness, swelling, or pain. ? Fluid or blood. ? Warmth. ? Pus or a bad smell.  Keep your dressing dry until your health care provider says that it can be removed. Managing pain, stiffness, and swelling   If directed, put ice on the painful area. ? Put ice in a plastic bag. ? Place a towel between your skin and the bag. ? Leave the ice on for 20 minutes, 2-3 times a day. Activity  Rest your back and avoid intense physical activity for as long as told by your health care provider.  Avoid bending, lifting, or twisting your back for as long as told by your health care provider.  Return to your normal activities as told by your health care provider. Ask your health care provider what activities are safe for you.  Do not lift anything that is heavier than 5 lb (2.2 kg). You may need to avoid heavy lifting for several weeks. General instructions  Do not use any products that contain nicotine or tobacco, such as cigarettes, e-cigarettes, and chewing tobacco. These can delay bone healing. If you need help quitting, ask your health care provider.  Do not drive for 24 hours if you were given a sedative during your procedure.  Keep all follow-up visits as told by your health  care provider. This is important. Contact a health care provider if:  You have a fever or chills.  You have redness, swelling, or pain at the site of your puncture.  You have fluid, blood, or pus coming from the puncture site.  You have pain that gets worse or does not get better with medicine.  You develop numbness or weakness in any part of your body. Get help right away if:  You have chest pain.  You have difficulty breathing.  You have weakness, numbness, or tingling in your legs.  You cannot control your bladder or bowel movements.  You  suddenly become weak or numb on one side of your body.  You become very confused.  You have trouble speaking or understanding, or both. Summary  Follow instructions from your health care provider about how to take care of your puncture site.  Take over-the-counter and prescription medicines only as told by your health care provider.  Rest your back and avoid intense physical activity for as long as told by your health care provider.  Contact a health care provider if you have pain that gets worse or does not get better with medicine.  Keep all follow-up visits as told by your health care provider. This is important. This information is not intended to replace advice given to you by your health care provider. Make sure you discuss any questions you have with your health care provider. Document Revised: 12/12/2017 Document Reviewed: 12/12/2017 Elsevier Patient Education  2020 [employer].

## 2019-05-23 ENCOUNTER — Other Ambulatory Visit (HOSPITAL_COMMUNITY): Payer: Self-pay | Admitting: Interventional Radiology

## 2019-05-23 DIAGNOSIS — S32040A Wedge compression fracture of fourth lumbar vertebra, initial encounter for closed fracture: Secondary | ICD-10-CM

## 2019-05-24 ENCOUNTER — Other Ambulatory Visit: Payer: Self-pay

## 2019-05-24 ENCOUNTER — Ambulatory Visit (INDEPENDENT_AMBULATORY_CARE_PROVIDER_SITE_OTHER): Payer: Medicare Other | Admitting: Family Medicine

## 2019-05-24 ENCOUNTER — Encounter: Payer: Self-pay | Admitting: Family Medicine

## 2019-05-24 VITALS — BP 127/87 | HR 73 | Temp 98.6°F | Ht 71.0 in | Wt 130.6 lb

## 2019-05-24 DIAGNOSIS — I10 Essential (primary) hypertension: Secondary | ICD-10-CM

## 2019-05-24 DIAGNOSIS — R351 Nocturia: Secondary | ICD-10-CM

## 2019-05-24 DIAGNOSIS — R634 Abnormal weight loss: Secondary | ICD-10-CM

## 2019-05-24 DIAGNOSIS — N401 Enlarged prostate with lower urinary tract symptoms: Secondary | ICD-10-CM | POA: Diagnosis not present

## 2019-05-24 NOTE — Patient Instructions (Addendum)
If you have thoughts of self-harm-contact us immediately.  If you feel like you have depression or decreased interest in doing things also let us know immediately  Please call [phone] to schedule a visit with [facility] -Trey Paula is an excellent counselor who is based out of our clinic  Glad flomax is really helping- I am interested in what urology thinks  If tolerable try to do the booster in Ensure twice a day between meals.  Make sure to continue to eat 3 meals a day-we do not see any more weight loss  Blood pressure looking good despite lower dose losartan  Recommended follow up: Return in about 1 month (around 06/24/2019) for follow up- or sooner if needed.

## 2019-05-24 NOTE — Progress Notes (Signed)
Phone [phone] In person visit   Subjective:   RAMONA CLARIDA is a 74 y.o. year old very pleasant male patient who presents for/with See problem oriented charting Chief Complaint  Patient presents with  . Hyperlipidemia  . Back Pain   This visit occurred during the SARS-CoV-2 public health emergency.  Safety protocols were in place, including screening questions prior to the visit, additional usage of staff PPE, and extensive cleaning of exam room while observing appropriate contact time as indicated for disinfecting solutions.   Past Medical History-  Patient Active Problem List   Diagnosis Date Noted  . History of suicide attempt- while on high dose steroids. avoid prednisone.  01/19/2007    Priority: High  . Testicular swelling, left 04/30/2014    Priority: Medium  . BPH associated with nocturia 04/30/2014    Priority: Medium  . Gout 01/08/2014    Priority: Medium  . Insomnia 10/29/2013    Priority: Medium  . Hyperlipidemia 06/17/2009    Priority: Medium  . History of arteritis 05/23/2007    Priority: Medium  . Essential hypertension 07/21/2006    Priority: Medium  . Former smoker 04/30/2014    Priority: Low  . Prepatellar bursitis of right knee 01/22/2014    Priority: Low  . GERD 07/21/2006    Priority: Low  . Protein-calorie malnutrition ([place]) 05/01/2019  . Senile cataracts of both eyes 08/22/2013  . 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 Macular degeneration 02/03/2011    Medications- reviewed and updated Current Outpatient Medications  Medication Sig Dispense Refill  . acetaminophen (TYLENOL) 500 MG tablet Take 1,000 mg by mouth every 8 (eight) hours as needed for moderate pain.    Marland Kitchen allopurinol (ZYLOPRIM) 100 MG tablet Take 1 tablet (100 mg total) by mouth daily. 90 tablet 3  . atorvastatin (LIPITOR) 40 MG tablet Take 1 tablet (40 mg total) by mouth 3 (three) times a week. 90 tablet 3  . calcium  carbonate (TUMS - DOSED IN MG ELEMENTAL CALCIUM) 500 MG chewable tablet Chew 1 tablet by mouth as needed for indigestion or heartburn.    . clonazePAM (KLONOPIN) 1 MG tablet TAKE 1/2 TO 1 TABLET BY MOUTH EVERY DAY AS NEEDED (Patient taking differently: Take 0.5-1 mg by mouth daily as needed for anxiety. TAKE 1/2 TO 1 TABLET BY MOUTH EVERY DAY AS NEEDED) 15 tablet 1  . docusate sodium (COLACE) 100 MG capsule Take 100 mg by mouth daily as needed for mild constipation.    . indomethacin (INDOCIN) 25 MG capsule TAKE ONE CAPSULE BY MOUTH 3 TIMES A DAY AS NEEDED (Patient taking differently: Take 25 mg by mouth 3 (three) times daily as needed for mild pain (gout). TAKE ONE CAPSULE BY MOUTH 3 TIMES A DAY AS NEEDED) 60 capsule 1  . LORazepam (ATIVAN) 0.5 MG tablet 1-2 tabs 30 - 60 min prior to MRI. Do not drive with this medicine. (Patient not taking: Reported on 05/24/2019) 4 tablet 0  . losartan (COZAAR) 100 MG tablet Take 0.5 tablets (50 mg total) by mouth daily. 45 tablet 3  . tamsulosin (FLOMAX) 0.4 MG CAPS capsule TAKE 1 CAPSULE BY MOUTH EVERY DAY (Patient taking differently: Take 0.4 mg by mouth daily. ) 90 capsule 1  . traMADol (ULTRAM) 50 MG tablet Take 1 tablet (50 mg total) by mouth every 8 (eight) hours as needed for severe pain. 15 tablet 0   No current facility-administered medications for this visit.  Objective:  BP 127/87   Pulse 73   Temp 98.6 F (37 C) (Temporal)   Ht 5\' 11"  (1.803 m)   Wt 130 lb 9.6 oz (59.2 kg)   SpO2 97%   BMI 18.22 kg/m  Gen: NAD, resting comfortably CV: RRR no murmurs rubs or gallops Lungs: CTAB no crackles, wheeze, rhonchi Abdomen: soft/nontender/nondistended/normal bowel sounds. No rebound or guarding.  Ext: no edema Skin: warm, dry    Assessment and Plan   # BPH S: Patient with weak stream and frequent urination at last visit.  The symptoms are much improved on Flomax.  He does have a very elevated PSA and we have referred him to urology for their  opinion. A/P: I suspect patient has an enlarged prostate as the cause of most of his symptoms.  We did discuss there still is a possibility of prostate cancer with the elevated PSA.  He is going to follow-up with urology.  I am going to hold off on treating for prostatitis which was previously considered until he is evaluated by urology given improvement in symptoms on Flomax. He had a nontender prostate exam.  -reports visit with Dr. Jeffie Pollock on may 17th Lab Results  Component Value Date   PSA 5.53 (H) 05/01/2019   PSA 2.98 02/26/2019   PSA 2.80 10/12/2017    # unintentional weight loss S:has tried to increase food intake but still losing weight down another lb.  Boot once a day.  Wt Readings from Last 3 Encounters:  05/24/19 130 lb 9.6 oz (59.2 kg)  05/22/19 131 lb (59.4 kg)  05/07/19 131 lb (59.4 kg)  A/P: For unintentional weight loss weight is down 1 more pound-encourage patient to do his best to continue to eat regular meals and try to keep the weight up.  I would recommend doing boost or Ensure twice a day if tolerable.  We opted to follow-up in 1 month to check on him-if continues to lose weight and urology consult is reassuring could also consider CT abdomen pelvis and chest  # Concern for Depression S: Medication:none   Patient has recently been seen by sports medicine Dr. Georgina Snell.  Dr. Georgina Snell was worried about patient's worsening mood and concern for depression particular with his history of suicide attempt related to steroid use.  He is being worked up for potential compression fracture Depression screen [facility] 2/9 05/24/2019 02/26/2019 08/23/2018  Decreased Interest 0 0 0  Down, Depressed, Hopeless 0 0 0  PHQ - 2 Score 0 0 0  Altered sleeping 0 - -  Tired, decreased energy 3 - -  Change in appetite 2 - -  Feeling bad or failure about yourself  0 - -  Trouble concentrating 0 - -  Moving slowly or fidgety/restless 0 - -  Suicidal thoughts 0 - -  PHQ-9 Score 5 - -  Difficult doing  work/chores Not difficult at all - -  A/P: Dr. Georgina Snell was concerned about depression.  Patient does not or depressed mood at this time.  He does not feel depressed-he just feels stressed from his recent issues with his back.  He is also concerned about his health with weight loss as well as elevated PSA. -We did discuss possibly using an antidepressant but any and he would prefer not to add another medicine to his already numerous medications. -He feels like he has a lot going on with different medical visits-we are going to give him the name for Trey Paula and he will consider setting  up a visit but for now he is leaning away from this -feels like back pain is main issues so wants to see how he does with kyphoplasty as well -return/emergent precautions given  #Hypertension S: Compliant with losartan 100 mg--> 50mg  with weight loss and trialing flomax A/P: blood pressure controlled with lower level of losartan 50mg . Continue current medicines.    Recommended follow up: 1 month follow up Future Appointments  Date Time Provider [place]  05/29/2019 11:30 AM [facility] 2 [facility]  08/27/2019 10:40 AM Yong Channel Brayton Mars, MD [id number]    Lab/Order associations:   ICD-10-CM   1. Essential hypertension  I10   2. BPH associated with nocturia  N40.1    R35.1   3. Unintentional weight loss  R63.4     No orders of the defined types were placed in this encounter.    Return precautions advised.  [name], MD

## 2019-05-28 ENCOUNTER — Other Ambulatory Visit: Payer: Self-pay | Admitting: Radiology

## 2019-05-28 ENCOUNTER — Telehealth: Payer: Self-pay | Admitting: Family Medicine

## 2019-05-28 MED ORDER — VANCOMYCIN HCL 10 G IV SOLR: 1000.0000 mg | Freq: Once | INTRAVENOUS | Status: DC

## 2019-05-28 NOTE — Telephone Encounter (Signed)
Bone biopsy is not needed.  Ok to call back and relay that info

## 2019-05-28 NOTE — Telephone Encounter (Signed)
Ashley notified, no bone biopsy.

## 2019-05-28 NOTE — Telephone Encounter (Signed)
Kevin Perez from Dr. Oletha Blend called, pt is scheduled for a kyphoplasty tomorrow and she wants to know if you would also like a bone biopsy.  Kevin Perez [phone]

## 2019-05-29 ENCOUNTER — Telehealth: Payer: Self-pay | Admitting: Student

## 2019-05-29 ENCOUNTER — Ambulatory Visit (HOSPITAL_COMMUNITY): Admission: RE | Admit: 2019-05-29 | Payer: Medicare Other | Source: Ambulatory Visit

## 2019-05-29 NOTE — Telephone Encounter (Signed)
NIR.  Received message from patient's daughter-in-law, Butch Penny, stating that patient has been having diarrhea. States that he took OTC medications and it has subsided at this time. She is questioning if it is ok to proceed with procedure today.  Discussed case with Dr. Estanislado Pandy who recommends postponing procedure until diarrhea has subsided. Called Butch Penny [phone]) at [phone] to inform on above. Our schedulers to call patient to reschedule procedure for later this week. Patient's daughter-in-law aware to call us if diarrhea is still present on rescheduled procedure day. All questions answered and concerns addressed. Patient's daughter-in-law conveys understanding and agrees with plan.   Bea Graff Wendal Wilkie, PA-C 05/29/2019, 8:20 AM

## 2019-05-30 ENCOUNTER — Other Ambulatory Visit: Payer: Self-pay | Admitting: Radiology

## 2019-05-31 ENCOUNTER — Encounter (HOSPITAL_COMMUNITY): Payer: Self-pay

## 2019-05-31 ENCOUNTER — Ambulatory Visit: Payer: Medicare Other | Admitting: Family Medicine

## 2019-05-31 ENCOUNTER — Ambulatory Visit (HOSPITAL_COMMUNITY)
Admission: RE | Admit: 2019-05-31 | Discharge: 2019-05-31 | Disposition: A | Discharge status: Home / Self Care | Payer: Medicare Other | Source: Ambulatory Visit | Attending: Interventional Radiology | Admitting: Interventional Radiology

## 2019-05-31 ENCOUNTER — Other Ambulatory Visit: Payer: Self-pay

## 2019-05-31 ENCOUNTER — Other Ambulatory Visit (HOSPITAL_COMMUNITY): Payer: Self-pay | Admitting: Interventional Radiology

## 2019-05-31 DIAGNOSIS — X58XXXA Exposure to other specified factors, initial encounter: Secondary | ICD-10-CM | POA: Insufficient documentation

## 2019-05-31 DIAGNOSIS — K219 Gastro-esophageal reflux disease without esophagitis: Secondary | ICD-10-CM | POA: Insufficient documentation

## 2019-05-31 DIAGNOSIS — S32040A Wedge compression fracture of fourth lumbar vertebra, initial encounter for closed fracture: Secondary | ICD-10-CM | POA: Insufficient documentation

## 2019-05-31 DIAGNOSIS — Z87891 Personal history of nicotine dependence: Secondary | ICD-10-CM | POA: Diagnosis not present

## 2019-05-31 DIAGNOSIS — Z8249 Family history of ischemic heart disease and other diseases of the circulatory system: Secondary | ICD-10-CM | POA: Diagnosis not present

## 2019-05-31 DIAGNOSIS — I776 Arteritis, unspecified: Secondary | ICD-10-CM | POA: Diagnosis not present

## 2019-05-31 DIAGNOSIS — Z79899 Other long term (current) drug therapy: Secondary | ICD-10-CM | POA: Diagnosis not present

## 2019-05-31 DIAGNOSIS — M109 Gout, unspecified: Secondary | ICD-10-CM | POA: Insufficient documentation

## 2019-05-31 DIAGNOSIS — E785 Hyperlipidemia, unspecified: Secondary | ICD-10-CM | POA: Diagnosis not present

## 2019-05-31 DIAGNOSIS — I10 Essential (primary) hypertension: Secondary | ICD-10-CM | POA: Diagnosis not present

## 2019-05-31 DIAGNOSIS — I7300 Raynaud's syndrome without gangrene: Secondary | ICD-10-CM | POA: Insufficient documentation

## 2019-05-31 DIAGNOSIS — M4856XA Collapsed vertebra, not elsewhere classified, lumbar region, initial encounter for fracture: Secondary | ICD-10-CM | POA: Diagnosis not present

## 2019-05-31 HISTORY — PX: IR VERTEBROPLASTY LUMBAR BX INC UNI/BIL INC/INJECT/IMAGING: IMG5516

## 2019-05-31 LAB — BASIC METABOLIC PANEL
Anion gap: 12 (ref 5–15)
BUN: 13 mg/dL (ref 8–23)
CO2: 29 mmol/L (ref 22–32)
Calcium: 10.6 mg/dL — ABNORMAL HIGH (ref 8.9–10.3)
Chloride: 96 mmol/L — ABNORMAL LOW (ref 98–111)
Creatinine, Ser: 1.14 mg/dL (ref 0.61–1.24)
GFR calc Af Amer: >60 mL/min (ref >60)
GFR calc non Af Amer: >60 mL/min (ref >60)
Glucose, Bld: 108 mg/dL — ABNORMAL HIGH (ref 70–99)
Potassium: 3.6 mmol/L (ref 3.5–5.1)
Sodium: 137 mmol/L (ref 135–145)

## 2019-05-31 LAB — CBC WITH DIFFERENTIAL/PLATELET
Abs Immature Granulocytes: 0.01 10*3/uL (ref 0.00–0.07)
Basophils Absolute: 0 10*3/uL (ref 0.0–0.1)
Basophils Relative: 1 %
Eosinophils Absolute: 0.1 10*3/uL (ref 0.0–0.5)
Eosinophils Relative: 2 %
HCT: 44.2 % (ref 39.0–52.0)
Hemoglobin: 14.1 g/dL (ref 13.0–17.0)
Immature Granulocytes: 0 %
Lymphocytes Relative: 23 %
Lymphs Abs: 1.4 10*3/uL (ref 0.7–4.0)
MCH: 30.1 pg (ref 26.0–34.0)
MCHC: 31.9 g/dL (ref 30.0–36.0)
MCV: 94.2 fL (ref 80.0–100.0)
Monocytes Absolute: 0.5 10*3/uL (ref 0.1–1.0)
Monocytes Relative: 7 %
Neutro Abs: 4.2 10*3/uL (ref 1.7–7.7)
Neutrophils Relative %: 67 %
Platelets: 298 10*3/uL (ref 150–400)
RBC: 4.69 MIL/uL (ref 4.22–5.81)
RDW: 13.4 % (ref 11.5–15.5)
WBC: 6.2 10*3/uL (ref 4.0–10.5)
nRBC: 0 % (ref 0.0–0.2)

## 2019-05-31 LAB — URINALYSIS, ROUTINE W REFLEX MICROSCOPIC
Bilirubin Urine: NEGATIVE
Glucose, UA: NEGATIVE mg/dL
Hgb urine dipstick: NEGATIVE
Ketones, ur: NEGATIVE mg/dL
Leukocytes,Ua: NEGATIVE
Nitrite: NEGATIVE
Protein, ur: NEGATIVE mg/dL
Specific Gravity, Urine: 1.015 (ref 1.005–1.030)
pH: 6 (ref 5.0–8.0)

## 2019-05-31 LAB — PROTIME-INR
INR: 1.1 (ref 0.8–1.2)
Prothrombin Time: 13.4 seconds (ref 11.4–15.2)

## 2019-05-31 LAB — APTT: aPTT: 30 seconds (ref 24–36)

## 2019-05-31 MED ORDER — SODIUM CHLORIDE 0.9 % IV SOLN: INTRAVENOUS | Status: DC

## 2019-05-31 MED ORDER — SODIUM CHLORIDE 0.9 % IV SOLN: INTRAVENOUS | Status: AC

## 2019-05-31 MED ORDER — MIDAZOLAM HCL 2 MG/2ML IJ SOLN
INTRAMUSCULAR | Status: AC | PRN
  Administered 2019-05-31: 1 mg via INTRAVENOUS

## 2019-05-31 MED ORDER — IOHEXOL 300 MG/ML  SOLN: 50.0000 mL | Freq: Once | INTRAMUSCULAR | Status: DC | PRN

## 2019-05-31 MED ORDER — FENTANYL CITRATE (PF) 100 MCG/2ML IJ SOLN
INTRAMUSCULAR | Status: AC
  Filled 2019-05-31: qty 2

## 2019-05-31 MED ORDER — BUPIVACAINE HCL (PF) 0.5 % IJ SOLN
INTRAMUSCULAR | Status: AC
  Filled 2019-05-31: qty 30

## 2019-05-31 MED ORDER — TOBRAMYCIN SULFATE 1.2 G IJ SOLR
INTRAMUSCULAR | Status: AC
  Filled 2019-05-31: qty 1.2

## 2019-05-31 MED ORDER — HYDROMORPHONE HCL 1 MG/ML IJ SOLN
INTRAMUSCULAR | Status: AC
  Filled 2019-05-31: qty 1

## 2019-05-31 MED ORDER — VANCOMYCIN HCL 1000 MG IV SOLR
INTRAVENOUS | Status: AC | PRN
  Administered 2019-05-31: 1 g

## 2019-05-31 MED ORDER — FENTANYL CITRATE (PF) 100 MCG/2ML IJ SOLN
INTRAMUSCULAR | Status: AC | PRN
  Administered 2019-05-31: 25 ug via INTRAVENOUS

## 2019-05-31 MED ORDER — MIDAZOLAM HCL 2 MG/2ML IJ SOLN
INTRAMUSCULAR | Status: AC
  Filled 2019-05-31: qty 2

## 2019-05-31 MED ORDER — VANCOMYCIN HCL IN DEXTROSE 1-5 GM/200ML-% IV SOLN
INTRAVENOUS | Status: AC
  Filled 2019-05-31: qty 200

## 2019-05-31 NOTE — Progress Notes (Signed)
Discharge instructions reviewed with pt and Gerald Stabs (via telephone) both voice understanding.

## 2019-05-31 NOTE — Discharge Instructions (Signed)
1.NO stooping,or bending or lifting more than 10 lbs for 2 weeks     2.Use walker to ambulate for 2 weeks. 3.No driving for 2 weeks. 4.RTC PRN 2 weeks   Balloon Kyphoplasty, Care After This sheet gives you information about how to care for yourself after your procedure. Your health care provider may also give you more specific instructions. If you have problems or questions, contact your health care provider. What can I expect after the procedure? After your procedure, it is common to have back pain. Follow these instructions at home: Medicines  Take over-the-counter and prescription medicines only as told by your health care provider.  Ask your health care provider if the medicine prescribed to you: ? Requires you to avoid driving or using heavy machinery. ? Can cause constipation. You may need to take steps to prevent or treat constipation, such as:  Drink enough fluid to keep your urine pale yellow.  Take over-the-counter or prescription medicines.  Eat foods that are high in fiber, such as beans, whole grains, and fresh fruits and vegetables.  Limit foods that are high in fat and processed sugars, such as fried or sweet foods. Puncture site care   Follow instructions from your health care provider about how to take care of your puncture site. Make sure you: ? Wash your hands with soap and water before and after you change your bandage (dressing). If soap and water are not available, use hand sanitizer. ? Change your dressing as told by your health care provider. ? Leave skin glue or adhesive strips in place. These skin closures may need to be in place for 2 weeks or longer. If adhesive strip edges start to loosen and curl up, you may trim the loose edges. Do not remove adhesive strips completely unless your health care provider tells you to do that.  Check your puncture site every day for signs of infection. Watch for: ? Redness, swelling, or pain. ? Fluid or  blood. ? Warmth. ? Pus or a bad smell.  Keep your dressing dry until your health care provider says that it can be removed. Managing pain, stiffness, and swelling   If directed, put ice on the painful area. ? Put ice in a plastic bag. ? Place a towel between your skin and the bag. ? Leave the ice on for 20 minutes, 2-3 times a day. Activity  Rest your back and avoid intense physical activity for as long as told by your health care provider.  Avoid bending, lifting, or twisting your back for as long as told by your health care provider.  Return to your normal activities as told by your health care provider. Ask your health care provider what activities are safe for you.  Do not lift anything that is heavier than 5 lb (2.2 kg). You may need to avoid heavy lifting for several weeks. General instructions  Do not use any products that contain nicotine or tobacco, such as cigarettes, e-cigarettes, and chewing tobacco. These can delay bone healing. If you need help quitting, ask your health care provider.  Do not drive for 24 hours if you were given a sedative during your procedure.  Keep all follow-up visits as told by your health care provider. This is important. Contact a health care provider if:  You have a fever or chills.  You have redness, swelling, or pain at the site of your puncture.  You have fluid, blood, or pus coming from the puncture site.  You have  pain that gets worse or does not get better with medicine.  You develop numbness or weakness in any part of your body. Get help right away if:  You have chest pain.  You have difficulty breathing.  You have weakness, numbness, or tingling in your legs.  You cannot control your bladder or bowel movements.  You suddenly become weak or numb on one side of your body.  You become very confused.  You have trouble speaking or understanding, or both. Summary  Follow instructions from your health care provider about  how to take care of your puncture site.  Take over-the-counter and prescription medicines only as told by your health care provider.  Rest your back and avoid intense physical activity for as long as told by your health care provider.  Contact a health care provider if you have pain that gets worse or does not get better with medicine.  Keep all follow-up visits as told by your health care provider. This is important. This information is not intended to replace advice given to you by your health care provider. Make sure you discuss any questions you have with your health care provider. Document Revised: 12/12/2017 Document Reviewed: 12/12/2017 Elsevier Patient Education  2020 [employer].

## 2019-05-31 NOTE — Procedures (Signed)
S/P L4 VP. S.Princessa Lesmeister MD

## 2019-05-31 NOTE — H&P (Addendum)
Chief Complaint: Patient was seen in consultation today for Lumbar 4 Kyphoplasty at the request of Dr Lynne Leader   Supervising Physician: Luanne Bras  Patient Status: [facility] - Out-pt  History of Present Illness: Kevin Perez is a 74 y.o. male   Low back pain to left leg Pain x several weeks PT sessions only made things feel worse he says Meds no relief  MRI 05/19/19: IMPRESSION: 1. Chronic compression fractures T12, L1, L2, L3. Moderately severe compression fracture of L4 with bone month edema consistent with a more recent fracture. This has progressed since the prior radiographic study of 03/20/2019. 2. 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  Referred to Dr Estanislado Pandy for kyphoplasty He approves procedure    Past Medical History:  Diagnosis Date  . GERD 07/21/2006  . Gout   . HYPERLIPIDEMIA 06/17/2009  . HYPERTENSION 07/21/2006  . Raynaud's syndrome 07/21/2006  . Suicide attempt [facility])    Due to Prednisone!  Marland Kitchen VASCULITIS 05/23/2007    Past Surgical History:  Procedure Laterality Date  . HERNIA REPAIR  [id number]   umbilical hernia  . INGUINAL HERNIA REPAIR     Bil    Allergies: Prednisone, Amoxicillin-pot clavulanate, Ace inhibitors, and Codeine phosphate  Medications: Prior to Admission medications   Medication Sig Start Date End Date Taking? Authorizing Provider  acetaminophen (TYLENOL) 500 MG tablet Take 1,000 mg by mouth every 8 (eight) hours as needed for moderate pain.   Yes [provider]  allopurinol (ZYLOPRIM) 100 MG tablet Take 1 tablet (100 mg total) by mouth daily. 02/26/19  Yes [name], MD  calcium carbonate (TUMS - DOSED IN MG ELEMENTAL CALCIUM) 500 MG chewable tablet Chew 1 tablet by mouth as needed for indigestion or heartburn.   Yes [provider]  clonazePAM (KLONOPIN) 1 MG tablet TAKE 1/2 TO 1 TABLET BY MOUTH EVERY DAY  AS NEEDED Patient taking differently: Take 0.5-1 mg by mouth daily as needed for anxiety. TAKE 1/2 TO 1 TABLET BY MOUTH EVERY DAY AS NEEDED 04/30/19  Yes [name], MD  docusate sodium (COLACE) 100 MG capsule Take 100 mg by mouth daily as needed for mild constipation.   Yes [provider]  indomethacin (INDOCIN) 25 MG capsule TAKE ONE CAPSULE BY MOUTH 3 TIMES A DAY AS NEEDED Patient taking differently: Take 25 mg by mouth 3 (three) times daily as needed for mild pain (gout). TAKE ONE CAPSULE BY MOUTH 3 TIMES A DAY AS NEEDED 02/26/19  Yes [name], MD  losartan (COZAAR) 100 MG tablet Take 0.5 tablets (50 mg total) by mouth daily. 05/01/19  Yes [name], MD  tamsulosin (FLOMAX) 0.4 MG CAPS capsule TAKE 1 CAPSULE BY MOUTH EVERY DAY Patient taking differently: Take 0.4 mg by mouth daily.  05/17/19  Yes [name], MD  traMADol (ULTRAM) 50 MG tablet Take 1 tablet (50 mg total) by mouth every 8 (eight) hours as needed for severe pain. 05/16/19  Yes Gregor Hams, MD  atorvastatin (LIPITOR) 40 MG tablet Take 1 tablet (40 mg total) by mouth 3 (three) times a week. 02/28/19   [name], MD     Family History  Problem Relation Age of Onset  . Other Mother        passed age 59  . Hypertension Father   . Prostate cancer Father 80       never operated, not cause  of death  . Stroke Father   . Meniere's disease Father   . Early death Paternal Grandfather   . Bladder Cancer Son     Social History   Socioeconomic History  . Marital status: Married    Spouse name: Not on file  . Number of children: 2  . Years of education: Not on file  . Highest education level: Not on file  Occupational History  . Occupation: Retired   Tobacco Use  . Smoking status: Former Smoker    Packs/day: 0.05    Years: 20.00    Pack years: 1.00    Types: Cigarettes  . Smokeless tobacco: Former Systems developer    Types: [place] date: 06/26/1989  . Tobacco comment: chewed tobacco  until 89 more than he smoked   Substance and Sexual Activity  . Alcohol use: Yes    Alcohol/week: 2.0 standard drinks    Types: 2 Standard drinks or equivalent per week    Comment: with meals on occasion  . Drug use: No  . Sexual activity: Not Currently  Other Topics Concern  . Not on file  Social History Narrative   Widowed 2019- was Married 1967. 2 kids. 1 grandchild. 1 greatgrandchilden.       Retired 2003 from [employer]      Hobbies: fishing, shagging, exercise, cruise, 45 GTO convertible   Social Determinants of Health   Financial Resource Strain:   . Difficulty of Paying Living Expenses:   Food Insecurity:   . Worried About Charity fundraiser in the Last Year:   . Arboriculturist in the Last Year:   Transportation Needs:   . Film/video editor (Medical):   Marland Kitchen Lack of Transportation (Non-Medical):   Physical Activity:   . Days of Exercise per Week:   . Minutes of Exercise per Session:   Stress:   . Feeling of Stress :   Social Connections:   . Frequency of Communication with Friends and Family:   . Frequency of Social Gatherings with Friends and Family:   . Attends Religious Services:   . Active Member of Clubs or Organizations:   . Attends Archivist Meetings:   Marland Kitchen Marital Status:     Review of Systems: A 12 point ROS discussed and pertinent positives are indicated in the HPI above.  All other systems are negative.  Review of Systems  Constitutional: Positive for activity change and fatigue. Negative for fever.  Respiratory: Negative for cough and shortness of breath.   Cardiovascular: Negative for chest pain.  Gastrointestinal: Negative for abdominal pain.  Musculoskeletal: Positive for back pain and gait problem.  Neurological: Positive for weakness.  Psychiatric/Behavioral: Negative for behavioral problems and confusion.    Vital Signs: BP 123/68   Pulse 74   Temp 98.4 F (36.9 C) (Oral)   Resp 17   Ht 5\' 10"  (1.778  m)   Wt 131 lb (59.4 kg)   SpO2 98%   BMI 18.80 kg/m   Physical Exam Vitals reviewed.  Constitutional:      Comments: Thin/frail  Cardiovascular:     Rate and Rhythm: Normal rate and regular rhythm.     Heart sounds: Normal heart sounds.  Pulmonary:     Effort: Pulmonary effort is normal.     Breath sounds: Normal breath sounds.  Abdominal:     Palpations: Abdomen is soft.  Musculoskeletal:        General: Normal range of motion.  Comments: Low back pain   Skin:    General: Skin is warm and dry.  Neurological:     Mental Status: He is alert and oriented to person, place, and time.  Psychiatric:        Behavior: Behavior normal.        Thought Content: Thought content normal.        Judgment: Judgment normal.     Imaging: DG Chest 2 View  Result Date: 05/01/2019 CLINICAL DATA:  Cough short of breath EXAM: CHEST - 2 VIEW COMPARISON:  08/08/2007, CT chest 06/15/2007 FINDINGS: Pulmonary hyperinflation. Linear scarring in the left mid lung. No acute consolidation or effusion. Normal heart size. No pneumothorax. Diffuse degenerative changes of the spine. Chronic wedging deformities of lower thoracic vertebra. IMPRESSION: No active cardiopulmonary disease. Linear scarring in the left mid lung Electronically Signed   By: Donavan Foil M.D.   On: 05/01/2019 23:36   MR Lumbar Spine Wo Contrast  Result Date: 05/19/2019 CLINICAL DATA:  Low back pain with lumbar radiculopathy. EXAM: MRI LUMBAR SPINE WITHOUT CONTRAST TECHNIQUE: Multiplanar, multisequence MR imaging of the lumbar spine was performed. No intravenous contrast was administered. COMPARISON:  Lumbar radiographs 03/20/2019 FINDINGS: Segmentation:  Normal Alignment: Mild retrolisthesis L1-2. Reversal of the lumbar lordosis with mild kyphosis. Vertebrae: Chronic appearing compression fracture T12, L1, L2, L3. Moderately severe compression fracture L4 with bone marrow edema consistent with a recent fracture. L4 fracture has  progressed since the prior x-ray study. Conus medullaris and cauda equina: Conus extends to the L1 level. Conus and cauda equina appear normal. Paraspinal and other soft tissues: Negative for paraspinous mass. Left renal cyst. Disc levels: T12-L1: Mild degenerative change without stenosis L1-2: Disc bulging and mild facet degeneration. Mild spinal stenosis and mild subarticular and foraminal stenosis bilaterally L2-3: Diffuse disc bulging with bilateral facet degeneration. Mild spinal stenosis. Subarticular stenosis bilaterally left greater than right L3-4: Disc degeneration with diffuse disc bulging and endplate spurring. Subarticular disc protrusion on the right with impingement of the right L4 nerve root. Severe subarticular and foraminal stenosis bilaterally right greater than left. Moderate spinal stenosis. L4-5: Disc degeneration with disc bulging and diffuse endplate spurring. Severe subarticular and foraminal stenosis bilaterally L5-S1: Disc degeneration with diffuse endplate spurring. There is severe subarticular and foraminal stenosis bilaterally due to spurring. Spinal canal adequate in size. IMPRESSION: 1. Chronic compression fractures T12, L1, L2, L3. Moderately severe compression fracture of L4 with bone month edema consistent with a more recent fracture. This has progressed since the prior radiographic study of 03/20/2019. 2. 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 Electronically Signed   By: Franchot Gallo M.D.   On: 05/19/2019 17:32    Labs:  CBC: Recent Labs    02/26/19 1056 05/01/19 1433  WBC 7.2 6.8  HGB 13.7 13.7  HCT 41.8 41.2  PLT 199.0 237.0    COAGS: No results for input(s): INR, APTT in the last 8760 hours.  BMP: Recent Labs    02/02/19 1141 02/26/19 1056 05/01/19 1433  NA 137 136 135  K 4.8 4.4 4.6  CL 100 100 97  CO2 29 28 30   GLUCOSE 100* 92 91  BUN 20 19 17     CALCIUM 9.8 9.8 10.1  CREATININE 1.19 1.15 0.98    LIVER FUNCTION TESTS: Recent Labs    02/26/19 1056 05/01/19 1433  BILITOT 0.7 0.4  AST 14 15  ALT 9 11  ALKPHOS 122*  125*  PROT 8.0 7.9  ALBUMIN 4.2 4.3    TUMOR MARKERS: No results for input(s): AFPTM, CEA, CA199, CHROMGRNA in the last 8760 hours.  Assessment and Plan:  Acute painful L4 fracture; MRI findings confirm meds and PT no real help Continued worsening pain Scheduled now for L4 Kyphoplasty Risks and benefits of Lumbar 4 Kyphoplasty were discussed with the patient including, but not limited to education regarding the natural healing process of compression fractures without intervention, bleeding, infection, cement migration which may cause spinal cord damage, paralysis, pulmonary embolism or even death.  This interventional procedure involves the use of X-rays and because of the nature of the planned procedure, it is possible that we will have prolonged use of X-ray fluoroscopy.  Potential radiation risks to you include (but are not limited to) the following: - A slightly elevated risk for cancer  several years later in life. This risk is typically less than 0.5% percent. This risk is low in comparison to the normal incidence of human cancer, which is 33% for women and 50% for men according to the [place]. - Radiation induced injury can include skin redness, resembling a rash, tissue breakdown / ulcers and hair loss (which can be temporary or permanent).   The likelihood of either of these occurring depends on the difficulty of the procedure and whether you are sensitive to radiation due to previous procedures, disease, or genetic conditions.   IF your procedure requires a prolonged use of radiation, you will be notified and given written instructions for further action.  It is your responsibility to monitor the irradiated area for the 2 weeks following the procedure and to notify your physician if  you are concerned that you have suffered a radiation induced injury.    All of the patient's questions were answered, patient is agreeable to proceed.  Consent signed and in chart.  Thank you for this interesting consult.  I greatly enjoyed [place] and look forward to participating in their care.  A copy of this report was sent to the requesting provider on this date.  Electronically Signed: Lavonia Drafts, PA-C 05/31/2019, 7:48 AM   I spent a total of  30 Minutes   in face to face in clinical consultation, greater than 50% of which was counseling/coordinating care for L4 KP

## 2019-06-01 ENCOUNTER — Telehealth: Payer: Self-pay

## 2019-06-01 DIAGNOSIS — Z87891 Personal history of nicotine dependence: Secondary | ICD-10-CM

## 2019-06-01 DIAGNOSIS — R634 Abnormal weight loss: Secondary | ICD-10-CM

## 2019-06-01 NOTE — Telephone Encounter (Signed)
Patient wanted Dr. Yong Channel to know that he's lost 5 more pounds. Patient states that he's lost a total of  35 pound since his wife died. Please follow up with patient.

## 2019-06-01 NOTE — Telephone Encounter (Signed)
Please advise 

## 2019-06-01 NOTE — Telephone Encounter (Signed)
I have ordered a CT of his chest without contrast as well as a CT of the abdomen pelvis-he should be getting information in the next week or 2 about these-please let us know if not

## 2019-06-01 NOTE — Addendum Note (Signed)
Addended by: [name] on: 06/01/2019 05:58 PM   Modules accepted: Orders

## 2019-06-06 ENCOUNTER — Ambulatory Visit (INDEPENDENT_AMBULATORY_CARE_PROVIDER_SITE_OTHER): Payer: Medicare Other

## 2019-06-06 ENCOUNTER — Ambulatory Visit (INDEPENDENT_AMBULATORY_CARE_PROVIDER_SITE_OTHER): Payer: Medicare Other | Admitting: Family Medicine

## 2019-06-06 ENCOUNTER — Other Ambulatory Visit: Payer: Self-pay

## 2019-06-06 ENCOUNTER — Encounter: Payer: Self-pay | Admitting: Family Medicine

## 2019-06-06 VITALS — BP 160/78 | HR 67 | Temp 97.2°F | Ht 70.0 in | Wt 124.0 lb

## 2019-06-06 DIAGNOSIS — R634 Abnormal weight loss: Secondary | ICD-10-CM

## 2019-06-06 DIAGNOSIS — M545 Low back pain, unspecified: Secondary | ICD-10-CM

## 2019-06-06 DIAGNOSIS — R972 Elevated prostate specific antigen [PSA]: Secondary | ICD-10-CM | POA: Diagnosis not present

## 2019-06-06 LAB — PSA: PSA: 3.05 ng/mL (ref 0.10–4.00)

## 2019-06-06 MED ORDER — MIRTAZAPINE 15 MG PO TABS
15.0000 mg | ORAL_TABLET | Freq: Every day | ORAL | 0 refills | Status: DC
Start: 2019-06-06 — End: 2019-06-28

## 2019-06-06 MED ORDER — PANTOPRAZOLE SODIUM 40 MG PO TBEC: 40.0000 mg | DELAYED_RELEASE_TABLET | Freq: Every day | ORAL | 3 refills | Status: DC

## 2019-06-06 MED ORDER — PANTOPRAZOLE SODIUM 40 MG PO TBEC
40.0000 mg | DELAYED_RELEASE_TABLET | Freq: Every day | ORAL | 0 refills | Status: DC
Start: 2019-06-06 — End: 2019-06-06

## 2019-06-06 NOTE — Progress Notes (Signed)
Patient: Kevin Perez MRN: [id number] DOB: 1945-08-27 PCP: [name], MD     Subjective:  Chief Complaint  Patient presents with  . Back Pain  . Weight Loss    Lost 40 pounds    HPI: The patient is a 74 y.o. male who presents today for weight loss and back pain.   Back pain He just had a IR vertebroplasty on 05/31/2019. He states he is still having pain a week after this procedure. He is supposed to use a walker for 2 weeks and was given tylenol. He states prior to procedure his pain was a 7/10. Pain today is still a 7/10 or more than that sometimes he states. He has not called IR to discuss this. If he lays flat on his cough and puts his right leg over the back of the couch, the pain gets better. Pain is worse with sitting and getting up and down. He denies any loss of sensation, urinary incontinence. He does have weakness in his proximal legs, but this was prior to surgery.   Weight loss He states Dr. Yong Channel wanted him to let him know if he continued to lose weight and he has lost four more pounds. He has ordered a CT of his chest and his abdomen and these are scheduled for June 1st. He started to lose weight beginning in 2017/02/14 after his wife passed away. He has no appetite at all and it's hard for him to eat. He has had an elevated PSA to 5.53 done one month ago with no repeat. PCP thought that this was due to BPH and he was referred to urology. He just cancelled the appointment, but states he has another one in June. He did have a MRI of his back done this month which showed no metastatic disease. He states he has no appetite. He can think about food and nearly get sick. He sometimes he has epigastric pain. No pain with food though. He feels like it's related to his flomax. He is having to take tums more frequently. He only drinks one cup of coffee/day. No NSAIDS daily, he denies any nausea/vomiting. He denies blood in his stool.   -negative stool occult: 04/2019 -lab  work up: 05/2019 -negative CXR ine 04/2019 -MRI lumbar spine 04/2019   Review of Systems  Constitutional: Positive for unexpected weight change. Negative for diaphoresis, fatigue and fever.  Respiratory: Negative for cough, shortness of breath and wheezing.   Cardiovascular: Negative for chest pain, palpitations and leg swelling.  Gastrointestinal: Negative for abdominal pain, blood in stool, diarrhea, nausea and vomiting.  Genitourinary: Negative for difficulty urinating, dysuria and frequency.  Musculoskeletal: Positive for back pain.    Allergies Patient is allergic to prednisone; amoxicillin-pot clavulanate; ace inhibitors; and codeine phosphate.  Past Medical History Patient  has a past medical history of GERD (07/21/2006), Gout, HYPERLIPIDEMIA (06/17/2009), HYPERTENSION (07/21/2006), Raynaud's syndrome (07/21/2006), Suicide attempt ([place]), and VASCULITIS (05/23/2007).  Surgical History Patient  has a past surgical history that includes Hernia repair (06/25/11); Inguinal hernia repair; and IR VERTEBROPLASTY LUMBAR BX INC UNI/BIL INC INJECT/IMAGING (05/31/2019).  Family History Pateint's family history includes Bladder Cancer in his son; Early death in his paternal grandfather; Hypertension in his father; Meniere's disease in his father; Other in his mother; Prostate cancer (age of onset: 64) in his father; Stroke in his father.  Social History Patient  reports that he has quit smoking. His smoking use included cigarettes. He has a 1.00 pack-year smoking history.  He quit smokeless tobacco use about 29 years ago.  His smokeless tobacco use included chew. He reports current alcohol use of about 2.0 standard drinks of alcohol per week. He reports that he does not use drugs.    Objective: Vitals:   06/06/19 1305  BP: (!) 160/78  Pulse: 67  Temp: (!) 97.2 F (36.2 C)  TempSrc: Temporal  SpO2: 98%  Weight: 124 lb (56.2 kg)  Height: 5\' 10"  (1.778 m)    Body mass index is 17.79  kg/m.  Physical Exam Vitals reviewed.  Constitutional:      Appearance: Normal appearance. He is normal weight.  HENT:     Head: Normocephalic and atraumatic.  Cardiovascular:     Rate and Rhythm: Normal rate and regular rhythm.     Heart sounds: Normal heart sounds.  Pulmonary:     Effort: Pulmonary effort is normal.     Breath sounds: Normal breath sounds.  Abdominal:     General: Abdomen is flat. Bowel sounds are normal.     Palpations: Abdomen is soft.  Musculoskeletal:     Comments: Strength 5/5 in lower legs. Hip abductors and adductors with slight decrease in strength, but still quite impressive for his age. TTp over left lower back and onto left iliac crest. He is kyphotic. Gait is slow.   Skin:    Comments: No erythema or change in skin from where vertebrolasty done  Neurological:     General: No focal deficit present.     Mental Status: He is alert and oriented to person, place, and time.  Psychiatric:        Mood and Affect: Mood normal.        Behavior: Behavior normal.        Assessment/plan: 1. Low back pain, unspecified back pain laterality, unspecified chronicity, unspecified whether sciatica present He is one week out from IR vertebroplasty. Discussed research is mixed on overall improvement of pain using this approach and there is no guarantee he would be pain free. He has not reached out to IR nor sports medicine. I will get in touch with Dr. Georgina Snell so he can hopefully see him back. He is not interested in PT. He does not like pain medication. Will continue tylenol prn and advised he use tramadol for breakthrough pain if he can tolerate this. I will get in touch with Dr. Georgina Snell so he can have follow up. Xray does not show any acute findings, but will let him know about official read.  Encouraged him to use walker as instructed. - DG Lumbar Spine Complete; Future  2. Elevated PSA Repeating today. Discussed concern for prostate cancer if still elevated with weight  loss, but not overly elevated. Discussed he needs to keep appointment with urologist if PSA still elevated. - PSA  3. Weight loss Large differential and PCP has already started this work up and discussed this with him.  -BMI still within normal range. Albumin also normal. Could check prealbumin.  Has been ongoing since 2019.  -has pending CT of chest/abdomen.  -lab work reassuring except for his PSA. Mri with no lesions.  -does have some hx with possible reflux/gastritis. We are going to start him on protonix daily. Discussed starting with H2 blocker, but he is really wanting to start protonix. Will have him f/u with pcp in 2 weeks.  -starting remeron for appetite enhancement. F/u with pcp in 2 weeks -may consider GI referral as well to r/o gastroparesis or other upper GI cause.  -  side effects of medication discussed.      This visit occurred during the SARS-CoV-2 public health emergency.  Safety protocols were in place, including screening questions prior to the visit, additional usage of staff PPE, and extensive cleaning of exam room while observing appropriate contact time as indicated for disinfecting solutions.    Return if symptoms worsen or fail to improve, for keep your appointment with dr. hunter in 2 weeks. Orma Flaming, MD [place]   06/06/2019

## 2019-06-06 NOTE — Patient Instructions (Signed)
-starting protonix in the morning. You will take this first thing before you eat. This is called a proton pump inhibitor that helps decrease acid in your stomach and helps reflux symptoms. Will also help gastritis if you have this.   -staring remeron, appetite stimulant. Take this pill daily. Just do not take at same time as the tramadol. Will have you follow up with dr. Yong Channel for the appointment you already have scheduled.   -I still have concerns you could have prostate cancer. Rechecking your PSA level today and please, please keep your appointment with urology in June.    Gastritis, Adult  Gastritis is swelling (inflammation) of the stomach. Gastritis can develop quickly (acute). It can also develop slowly over time (chronic). It is important to get help for this condition. If you do not get help, your stomach can bleed, and you can get sores (ulcers) in your stomach. What are the causes? This condition may be caused by:  Germs that get to your stomach.  Drinking too much alcohol.  Medicines you are taking.  Too much acid in the stomach.  A disease of the intestines or stomach.  Stress.  An allergic reaction.  Crohn's disease.  Some cancer treatments (radiation). Sometimes the cause of this condition is not known. What are the signs or symptoms? Symptoms of this condition include:  Pain in your stomach.  A burning feeling in your stomach.  Feeling sick to your stomach (nauseous).  Throwing up (vomiting).  Feeling too full after you eat.  Weight loss.  Bad breath.  Throwing up blood.  Blood in your poop (stool). How is this diagnosed? This condition may be diagnosed with:  Your medical history and symptoms.  A physical exam.  Tests. These can include: ? Blood tests. ? Stool tests. ? A procedure to look inside your stomach (upper endoscopy). ? A test in which a sample of tissue is taken for testing (biopsy). How is this treated? Treatment for this  condition depends on what caused it. You may be given:  Antibiotic medicine, if your condition was caused by germs.  H2 blockers and similar medicines, if your condition was caused by too much acid. Follow these instructions at home: Medicines  Take over-the-counter and prescription medicines only as told by your doctor.  If you were prescribed an antibiotic medicine, take it as told by your doctor. Do not stop taking it even if you start to feel better. Eating and drinking   Eat small meals often, instead of large meals.  Avoid foods and drinks that make your symptoms worse.  Drink enough fluid to keep your pee (urine) pale yellow. Alcohol use  Do not drink alcohol if: ? Your doctor tells you not to drink. ? You are pregnant, may be pregnant, or are planning to become pregnant.  If you drink alcohol: ? Limit your use to:  0-1 drink a day for women.  0-2 drinks a day for men. ? Be aware of how much alcohol is in your drink. In the [place] one drink equals one 12 oz bottle of beer (355 mL), one 5 oz glass of wine (148 mL), or one 1 oz glass of hard liquor (44 mL). General instructions  Talk with your doctor about ways to manage stress. You can exercise or do deep breathing, meditation, or yoga.  Do not smoke or use products that have nicotine or tobacco. If you need help quitting, ask your doctor.  Keep all follow-up visits as told by  your doctor. This is important. Contact a doctor if:  Your symptoms get worse.  Your symptoms go away and then come back. Get help right away if:  You throw up blood or something that looks like coffee grounds.  You have black or dark red poop.  You throw up any time you try to drink fluids.  Your stomach pain gets worse.  You have a fever.  You do not feel better after one week. Summary  Gastritis is swelling (inflammation) of the stomach.  You must get help for this condition. If you do not get help, your stomach can bleed,  and you can get sores (ulcers).  This condition is diagnosed with medical history, physical exam, or tests.  You can be treated with medicines for germs or medicines to block too much acid in your stomach. This information is not intended to replace advice given to you by your health care provider. Make sure you discuss any questions you have with your health care provider. Document Revised: 05/24/2017 Document Reviewed: 05/24/2017 Elsevier Patient Education  [place].

## 2019-06-08 ENCOUNTER — Other Ambulatory Visit: Payer: Self-pay

## 2019-06-08 ENCOUNTER — Ambulatory Visit: Payer: Medicare Other | Admitting: Family Medicine

## 2019-06-08 ENCOUNTER — Ambulatory Visit (INDEPENDENT_AMBULATORY_CARE_PROVIDER_SITE_OTHER): Payer: Medicare Other | Admitting: Family Medicine

## 2019-06-08 ENCOUNTER — Telehealth: Payer: Self-pay | Admitting: Family Medicine

## 2019-06-08 ENCOUNTER — Encounter: Payer: Self-pay | Admitting: Family Medicine

## 2019-06-08 VITALS — BP 130/70 | HR 77 | Ht 70.0 in | Wt 124.0 lb

## 2019-06-08 DIAGNOSIS — S32040D Wedge compression fracture of fourth lumbar vertebra, subsequent encounter for fracture with routine healing: Secondary | ICD-10-CM

## 2019-06-08 DIAGNOSIS — M5441 Lumbago with sciatica, right side: Secondary | ICD-10-CM | POA: Diagnosis not present

## 2019-06-08 DIAGNOSIS — M5442 Lumbago with sciatica, left side: Secondary | ICD-10-CM | POA: Diagnosis not present

## 2019-06-08 DIAGNOSIS — G8929 Other chronic pain: Secondary | ICD-10-CM | POA: Diagnosis not present

## 2019-06-08 DIAGNOSIS — E440 Moderate protein-calorie malnutrition: Secondary | ICD-10-CM

## 2019-06-08 NOTE — Patient Instructions (Signed)
Thank you for coming in today. Plan for some PT.  If not helping the next step is to try some back injection.  The back injections can take some figuring out to find out what part of the back is causing the most pain. Keep me updated.   Facet Blocks Patient Information  Description: The facets are joints in the spine between the vertebrae.  Like any joints in the body, facets can become irritated and painful.  Arthritis can also effect the facets.  By injecting steroids and local anesthetic in and around these joints, we can temporarily block the nerve supply to them.  Steroids act directly on irritated nerves and tissues to reduce selling and inflammation which often leads to decreased pain.  Facet blocks may be done anywhere along the spine from the neck to the low back depending upon the location of your pain.   After numbing the skin with local anesthetic (like Novocaine), a small needle is passed onto the facet joints under x-ray guidance.  You may experience a sensation of pressure while this is being done.  The entire block usually lasts about 15-25 minutes.   Conditions which may be treated by facet blocks:   Low back/buttock pain  Neck/shoulder pain  Certain types of headaches  Preparation for the injection:  1. Do not eat any solid food or dairy products within 8 hours of your appointment. 2. You may drink clear liquid up to 3 hours before appointment.  Clear liquids include water, black coffee, juice or soda.  No milk or cream please. 3. You may take your regular medication, including pain medications, with a sip of water before your appointment.  Diabetics should hold regular insulin (if taken separately) and take 1/2 normal NPH dose the morning of the procedure.  Carry some sugar containing items with you to your appointment. 4. A driver must accompany you and be prepared to drive you home after your procedure. 5. Bring all your current medications with you. 6. An IV may be  inserted and sedation may be given at the discretion of the physician. 7. A blood pressure cuff, EKG and other monitors will often be applied during the procedure.  Some patients may need to have extra oxygen administered for a short period. 8. You will be asked to provide medical information, including your allergies and medications, prior to the procedure.  We must know immediately if you are taking blood thinners (like Coumadin/Warfarin) or if you are allergic to IV iodine contrast (dye).  We must know if you could possible be pregnant.  Possible side-effects:   Bleeding from needle site  Infection (rare, may require surgery)  Nerve injury (rare)  Numbness & tingling (temporary)  Difficulty urinating (rare, temporary)  Spinal headache (a headache worse with upright posture)  Light-headedness (temporary)  Pain at injection site (serveral days)  Decreased blood pressure (rare, temporary)  Weakness in arm/leg (temporary)  Pressure sensation in back/neck (temporary)   Call if you experience:   Fever/chills associated with headache or increased back/neck pain  Headache worsened by an upright position  New onset, weakness or numbness of an extremity below the injection site  Hives or difficulty breathing (go to the emergency room)  Inflammation or drainage at the injection site(s)  Severe back/neck pain greater than usual  New symptoms which are concerning to you  Please note:  Although the local anesthetic injected can often make your back or neck feel good for several hours after the injection, the  pain will likely return. It takes 3-7 days for steroids to work.  You may not notice any pain relief for at least one week.  If effective, we will often do a series of 2-3 injections spaced 3-6 weeks apart to maximally decrease your pain.  After the initial series, you may be a candidate for a more permanent nerve block of the facets.

## 2019-06-08 NOTE — Telephone Encounter (Signed)
Pt returned call and Karlene Lineman relayed message that it is ok for pt to use heat or ice.

## 2019-06-08 NOTE — Telephone Encounter (Signed)
Patient called asking if it was okay to use heat on his back to help with the pain?  Please advise.

## 2019-06-08 NOTE — Progress Notes (Signed)
I, Wendy Poet, LAT, ATC, am serving as scribe for Dr. Lynne Leader.  Kevin Perez is a 74 y.o. male who presents to [facility] today for f/u of low back pain and L leg pain.  He was last seen by Dr. Georgina Snell on 05/22/19 w/ worsening symptoms.  He was prescribed Tramadol and provided w/ a back brace.  He was also referred for a kyphoplasty and had this procedure on 05/31/19 w/ no relief noted.  Since his last visit, pt reports that his low back pain is about the same, maybe slightly better.  He notes the pain in his back is more lateral than it was earlier.  The central pain has improved.  He notes pain and difficulty standing from a seated position.  He has been taking Tylenol and using Biofreeze.  He had trial of physical therapy for his back pain prior to MRI L-spine which diagnosed the subacute compression fracture which led to vertebroplasty.  This obviously did not help and he is reluctant to consider more physical therapy.  Of note pertinent medical history for suicide attempt following IV steroid administration.   Diagnostic imaging: L-spine XR- 03/20/19 and 06/06/19; L-spine MRI- 05/19/19  Pertinent review of systems: No fevers or chills  Relevant historical information: Protein calorie malnutrition and depression.  Recently started on mirtazapine by PCP which has been helpful.   Exam:  BP 130/70 (BP Location: Left Arm, Patient Position: Sitting, Cuff Size: Normal)   Pulse 77   Ht 5\' 10"  (1.778 m)   Wt 124 lb (56.2 kg)   SpO2 98%   BMI 17.79 kg/m  General: Well Developed, decreased muscle bulk along trunk appears protein calorie malnourished, and in no acute distress.   MSK:  L-spine: Decreased musculature with increased kyphosis thoracic and lumbar spine. Nontender spinal midline. No particular tender paraspinal and lateral musculature however decreased muscle bulk at pelvic brim and into the pelvic musculature present as well. Decreased lumbar  motion.  Significantly limited extension. Difficulty standing from a seated position. Using a walker to ambulate.  Psych: Improved affect.  No longer flat or tearful.  Normal speech and thought process.   Lab and Radiology Results Results for orders placed or performed in visit on 06/06/19 (from the past 72 hour(s))  PSA     Status: None   Collection Time: 06/06/19  1:38 PM  Result Value Ref Range   PSA 3.05 0.10 - 4.00 ng/mL    Comment: Test performed using [employer] PSA Assay, a parmagnetic partical, chemiluminecent immunoassay.   DG Lumbar Spine Complete  Result Date: 06/06/2019 CLINICAL DATA:  Continued back pain status post vertebroplasty. EXAM: LUMBAR SPINE - COMPLETE 4+ VIEW COMPARISON:  None. FINDINGS: There is no evidence of an acute lumbar spine fracture. Chronic loss of vertebral body height is seen throughout the lumbar spine with evidence of prior vertebroplasty noted at the level of L4 vertebral body. Marked severity endplate sclerosis is seen at the level of L5-S1. Mild endplate sclerosis is seen throughout the remainder of the lumbar spine. Approximately 2 mm retrolisthesis of the L4 vertebral body is seen on L5. There is marked severity intervertebral disc space narrowing noted at the level of L5-S1. IMPRESSION: 1. No evidence of an acute lumbar spine fracture. 2. 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 3. 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 Electronically Signed   By: Hoover Browns  Houston M.D.   On: 06/06/2019 21:01   I, Lynne Leader, personally (independently) visualized and performed the interpretation of the images attached in this note.     Assessment and Plan: 73 y.o. male with continued low back pain.  Back pain is definitely multifactorial.  He has considerable degenerative changes seen on lumbar spine recent x-ray and prior MRI. He recently had vertebroplasty which I  think has improved the central axial back pain that he was having which is mostly due to the vertebral compression fracture.  Likely some of his pain is due to the degenerative changes seen on MRI and x-ray.  However I think a lot of his pain and dysfunction right now is due to muscle weakness and spasm and pain.  He fundamentally has not been able to use his back musculature normally for months due to the compression fracture and has developed significant weakness and muscle wasting in his core.  Plan for limited physical therapy starting in about 2 weeks once he is far enough out from vertebroplasty to be safe and helpful. If this does not help after a few sessions would consider trial of facet joint injections.  Discussed that we will need to make some educated gases on which facet joints are the best targets.  Additionally discussed that we will have to use steroids as part of these injections which may increase his risk of psychosis.  However I feel this is reasonably safe for now that he is on more effective medication to manage his depression and anxiety.  Protein calorie malnutrition: Agree strongly with PCPs decision to start mirtazapine.  This definitely has helped.  Hopefully with increased exercise he can regain some musculature along with improved diet.    Orders Placed This Encounter  Procedures  . Ambulatory referral to Physical Therapy    Referral Priority:   Routine    Referral Type:   Physical Medicine    Referral Reason:   Specialty Services Required    Requested Specialty:   Physical Therapy   No orders of the defined types were placed in this encounter.    Discussed warning signs or symptoms. Please see discharge instructions. Patient expresses understanding.   The above documentation has been reviewed and is accurate and complete Lynne Leader, M.D.
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
## 2019-06-13 ENCOUNTER — Telehealth: Payer: Self-pay | Admitting: Family Medicine

## 2019-06-13 NOTE — Telephone Encounter (Signed)
I called to get him scheduled for PT and he said there is no way he is able to do it right now.  He is still in severe pain and he can barely move.  He mentioned that tomorrow he will be cleared to drive and do other things but he said that is not an option right now.  He wanted me to mention this to Dr Georgina Snell and his team to see what they want him to do.  He sounded upset, sounded like he was in a lot of pain, but he was also extremely thankful for everyone that is currently helping him.  He didn't feel well enough to call your office - he asked me to make you aware.  Please call patient and advise him on what he needs to do.  Thank you.

## 2019-06-14 NOTE — Telephone Encounter (Signed)
Please asked the patient if he would be willing to consider home health physical therapy

## 2019-06-14 NOTE — Telephone Encounter (Signed)
Thanks friend!

## 2019-06-14 NOTE — Telephone Encounter (Signed)
Thanks friend - ill touch base with him next week.

## 2019-06-14 NOTE — Telephone Encounter (Signed)
Called Kevin Perez and LM at home number asking if he would like to do home health Kevin Perez since he declined Kevin Perez at [facility] due to how much pain he's having and difficulty w/ driving.  Please advise if Kevin Perez would like to proceed w/ home health Kevin Perez so that Dr. Georgina Snell can place an order if needed.

## 2019-06-14 NOTE — Telephone Encounter (Signed)
Patient returned your call. He said that he would prefer to do PT at [facility] rather than at home. He just wanted to wait until he was feeling a little better. He thinks he would be fine to start next week.

## 2019-06-19 ENCOUNTER — Other Ambulatory Visit: Payer: Self-pay

## 2019-06-19 ENCOUNTER — Ambulatory Visit
Admission: RE | Admit: 2019-06-19 | Discharge: 2019-06-19 | Disposition: A | Discharge status: Home / Self Care | Payer: Medicare Other | Source: Ambulatory Visit | Attending: Family Medicine | Admitting: Family Medicine

## 2019-06-19 DIAGNOSIS — R634 Abnormal weight loss: Secondary | ICD-10-CM | POA: Diagnosis not present

## 2019-06-19 DIAGNOSIS — R161 Splenomegaly, not elsewhere classified: Secondary | ICD-10-CM | POA: Diagnosis not present

## 2019-06-19 DIAGNOSIS — Z87891 Personal history of nicotine dependence: Secondary | ICD-10-CM

## 2019-06-19 MED ORDER — IOPAMIDOL (ISOVUE-300) INJECTION 61%
100.0000 mL | Freq: Once | INTRAVENOUS | Status: AC | PRN
  Administered 2019-06-19: 100 mL via INTRAVENOUS

## 2019-06-20 ENCOUNTER — Encounter: Payer: Self-pay | Admitting: Family Medicine

## 2019-06-20 ENCOUNTER — Telehealth: Payer: Self-pay | Admitting: Family Medicine

## 2019-06-20 ENCOUNTER — Telehealth: Payer: Self-pay | Admitting: Hematology and Oncology

## 2019-06-20 ENCOUNTER — Ambulatory Visit (INDEPENDENT_AMBULATORY_CARE_PROVIDER_SITE_OTHER): Payer: Medicare Other | Admitting: Family Medicine

## 2019-06-20 VITALS — BP 150/82 | HR 60 | Temp 98.6°F | Ht 70.0 in | Wt 123.6 lb

## 2019-06-20 DIAGNOSIS — M545 Low back pain, unspecified: Secondary | ICD-10-CM

## 2019-06-20 DIAGNOSIS — M899 Disorder of bone, unspecified: Secondary | ICD-10-CM

## 2019-06-20 DIAGNOSIS — I700 Atherosclerosis of aorta: Secondary | ICD-10-CM | POA: Diagnosis not present

## 2019-06-20 DIAGNOSIS — R972 Elevated prostate specific antigen [PSA]: Secondary | ICD-10-CM

## 2019-06-20 DIAGNOSIS — I2510 Atherosclerotic heart disease of native coronary artery without angina pectoris: Secondary | ICD-10-CM | POA: Diagnosis not present

## 2019-06-20 DIAGNOSIS — I10 Essential (primary) hypertension: Secondary | ICD-10-CM

## 2019-06-20 MED ORDER — OXYCODONE-ACETAMINOPHEN 7.5-300 MG PO TABS: 1.0000 | ORAL_TABLET | Freq: Four times a day (QID) | ORAL | 0 refills | Status: DC | PRN

## 2019-06-20 MED ORDER — OXYCODONE-ACETAMINOPHEN 7.5-325 MG PO TABS: 1.0000 | ORAL_TABLET | Freq: Four times a day (QID) | ORAL | 0 refills | Status: DC | PRN

## 2019-06-20 NOTE — Telephone Encounter (Signed)
Patient's son is calling on behalf of his father stating [facility] was asking to change his medication and wanted to ask what medication they were changing, states he is a bit confused.

## 2019-06-20 NOTE — Telephone Encounter (Signed)
Received a new pt referral from Dr. Yong Channel for lytic bone lesion. Mr. Kevin Perez has been scheduled to see Dr. Lorenso Courier on 6/7 at 1pm. Appt date and time given to the pt's son. Aware to arrive 15 minutes early.

## 2019-06-20 NOTE — Telephone Encounter (Signed)
Called patient daughter in law answered she is not on DPR not able to review with patient. They will check on my chart and see updates.

## 2019-06-20 NOTE — Assessment & Plan Note (Signed)
We discussed CT scan findings of coronary atherosclerosis/calcium as well as aortic atherosclerosis.  We will restart aspirin 81 mg.  He will continue statin-increased earlier this year to 3 times a week-consider rechecking LDL in the future and intensifying statin for LDL goal under 70-with patient weakness and weight loss I want to make sure things stabilize before making this change Lab Results  Component Value Date   CHOL 151 02/26/2019   HDL 31.30 (L) 02/26/2019   LDLCALC 103 (H) 02/26/2019   LDLDIRECT 94.0 08/10/2017   TRIG 81.0 02/26/2019   CHOLHDL 5 02/26/2019

## 2019-06-20 NOTE — Patient Instructions (Addendum)
Health Maintenance Due  Topic Date Due  . TETANUS/TDAP -check with pharmacy 06/18/2019   We are going to try to get you set up with oncology ASAP  I am so sorry to share this news with you today- but the oncology team is going to take great care of you  Pain med -stop tramadol. Start oxycodone mixed with tylenol just half tablet every 6 hours- if you do ok with this can take full tablet if pain not full controlled. Do not take clonazepam within 8 hours of oxycodone- also do not drive for 8 hours after taking  Take miralax along with stool softener and make sure to remain well hydrated as oxycodone can constipate you  Blood pressure slightly high today but I think that's due to pain- hoping this will improve as we improve pain  Please stop by lab before you go If you have mychart- we will send your results within 3 business days of [place] receiving them.  If you do not have mychart- we will call you about results within 5 business days of [place] receiving them.

## 2019-06-22 LAB — PE AND FLC, SERUM
A/G Ratio: 0.9 (ref 0.7–1.7)
Albumin ELP: 3.9 g/dL (ref 2.9–4.4)
Alpha 1: 0.4 g/dL (ref 0.0–0.4)
Alpha 2: 0.9 g/dL (ref 0.4–1.0)
Beta: 1.1 g/dL (ref 0.7–1.3)
Gamma Globulin: 2.1 g/dL — ABNORMAL HIGH (ref 0.4–1.8)
Globulin, Total: 4.5 g/dL — ABNORMAL HIGH (ref 2.2–3.9)
Ig Kappa Free Light Chain: 29.9 mg/L — ABNORMAL HIGH (ref 3.3–19.4)
Ig Lambda Free Light Chain: 16.1 mg/L (ref 5.7–26.3)
KAPPA/LAMBDA RATIO: 1.86 — ABNORMAL HIGH (ref 0.26–1.65)
M-Spike, %: 1.5 g/dL — ABNORMAL HIGH
Total Protein: 8.4 g/dL (ref 6.0–8.5)

## 2019-06-22 LAB — PROTEIN ELECTROPHORESIS, SERUM

## 2019-06-25 ENCOUNTER — Encounter: Payer: Self-pay | Admitting: Hematology and Oncology

## 2019-06-25 ENCOUNTER — Inpatient Hospital Stay (HOSPITAL_BASED_OUTPATIENT_CLINIC_OR_DEPARTMENT_OTHER): Payer: Medicare Other | Admitting: Hematology and Oncology

## 2019-06-25 ENCOUNTER — Other Ambulatory Visit: Payer: Self-pay

## 2019-06-25 ENCOUNTER — Inpatient Hospital Stay: Payer: Medicare Other | Attending: Hematology and Oncology

## 2019-06-25 VITALS — BP 120/74 | HR 89 | Temp 97.7°F | Resp 18 | Ht 70.0 in | Wt 123.6 lb

## 2019-06-25 DIAGNOSIS — I10 Essential (primary) hypertension: Secondary | ICD-10-CM

## 2019-06-25 DIAGNOSIS — Z8042 Family history of malignant neoplasm of prostate: Secondary | ICD-10-CM

## 2019-06-25 DIAGNOSIS — M109 Gout, unspecified: Secondary | ICD-10-CM | POA: Insufficient documentation

## 2019-06-25 DIAGNOSIS — M545 Low back pain: Secondary | ICD-10-CM

## 2019-06-25 DIAGNOSIS — M899 Disorder of bone, unspecified: Secondary | ICD-10-CM

## 2019-06-25 DIAGNOSIS — Z79899 Other long term (current) drug therapy: Secondary | ICD-10-CM | POA: Diagnosis not present

## 2019-06-25 DIAGNOSIS — K5900 Constipation, unspecified: Secondary | ICD-10-CM | POA: Diagnosis not present

## 2019-06-25 DIAGNOSIS — D472 Monoclonal gammopathy: Secondary | ICD-10-CM | POA: Insufficient documentation

## 2019-06-25 DIAGNOSIS — R634 Abnormal weight loss: Secondary | ICD-10-CM | POA: Insufficient documentation

## 2019-06-25 DIAGNOSIS — Z7189 Other specified counseling: Secondary | ICD-10-CM | POA: Insufficient documentation

## 2019-06-25 DIAGNOSIS — C9000 Multiple myeloma not having achieved remission: Secondary | ICD-10-CM | POA: Insufficient documentation

## 2019-06-25 DIAGNOSIS — Z8052 Family history of malignant neoplasm of bladder: Secondary | ICD-10-CM | POA: Insufficient documentation

## 2019-06-25 DIAGNOSIS — E785 Hyperlipidemia, unspecified: Secondary | ICD-10-CM | POA: Insufficient documentation

## 2019-06-25 DIAGNOSIS — Z87891 Personal history of nicotine dependence: Secondary | ICD-10-CM

## 2019-06-25 LAB — SAVE SMEAR(SSMR), FOR PROVIDER SLIDE REVIEW

## 2019-06-25 LAB — LACTATE DEHYDROGENASE: LDH: 131 U/L (ref 98–192)

## 2019-06-25 LAB — CMP (CANCER CENTER ONLY)
ALT: 9 U/L (ref 0–44)
AST: 14 U/L — ABNORMAL LOW (ref 15–41)
Albumin: 3.9 g/dL (ref 3.5–5.0)
Alkaline Phosphatase: 147 U/L — ABNORMAL HIGH (ref 38–126)
Anion gap: 10 (ref 5–15)
BUN: 24 mg/dL — ABNORMAL HIGH (ref 8–23)
CO2: 32 mmol/L (ref 22–32)
Calcium: 12.3 mg/dL — ABNORMAL HIGH (ref 8.9–10.3)
Chloride: 97 mmol/L — ABNORMAL LOW (ref 98–111)
Creatinine: 1.4 mg/dL — ABNORMAL HIGH (ref 0.61–1.24)
GFR, Est AFR Am: 57 mL/min — ABNORMAL LOW (ref >60)
GFR, Estimated: 49 mL/min — ABNORMAL LOW (ref >60)
Glucose, Bld: 112 mg/dL — ABNORMAL HIGH (ref 70–99)
Potassium: 4.2 mmol/L (ref 3.5–5.1)
Sodium: 139 mmol/L (ref 135–145)
Total Bilirubin: 0.3 mg/dL (ref 0.3–1.2)
Total Protein: 8.5 g/dL — ABNORMAL HIGH (ref 6.5–8.1)

## 2019-06-25 LAB — URIC ACID: Uric Acid, Serum: 5 mg/dL (ref 3.7–8.6)

## 2019-06-25 MED ORDER — OXYCODONE HCL 5 MG PO TABS: 5.0000 mg | ORAL_TABLET | Freq: Four times a day (QID) | ORAL | 0 refills | Status: DC | PRN

## 2019-06-25 NOTE — Progress Notes (Signed)
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   Future    Number of Occurrences:   1    Standing Expiration Date:   06/24/2020  . Save Smear (SSMR)    Standing Status:   Future    Number of Occurrences:   1    Standing Expiration Date:   06/24/2020  . IFE with QIG (immunofixation electrophoresis)    Standing Status:   Future    Number of Occurrences:   1    Standing Expiration Date:   06/24/2020  . Beta 2 microglobulin    Standing Status:   Future    Number of Occurrences:   1    Standing Expiration Date:   06/24/2020  . 24-Hr Ur UPEP/UIFE/Light Chains/TP    Standing Status:   Future    Standing Expiration Date:   06/24/2020   All questions were answered. The patient knows to call the clinic with any problems, questions or concerns.  A total of more than 60 minutes were spent  on this encounter and over half of that time was spent on counseling and coordination of care as outlined above.   Ledell Peoples, MD Department of Hematology/Oncology [place] at [facility] Phone: [phone] Pager: [phone] Email: [email or web address]  06/25/2019 3:15 PM

## 2019-06-26 ENCOUNTER — Telehealth: Payer: Self-pay

## 2019-06-26 ENCOUNTER — Telehealth: Payer: Self-pay | Admitting: *Deleted

## 2019-06-26 NOTE — Telephone Encounter (Signed)
Called and attempted to schedule bone marrow. He does not want to schedule appt. He wants me to call his son and schedule because he does not drive. He ask that I call later today.

## 2019-06-26 NOTE — Telephone Encounter (Signed)
Received call from pt's son, Kathryn Linarez. He states that his father had dental exam yesterday and no problems were identified.   TCT Gerald Stabs and asked him to have the dental office fax their report to Dr. Lorenso Courier @ [phone]. Gerald Stabs states he will do this. Gerald Stabs states this was done in preparation for administration of Zometa.

## 2019-06-26 NOTE — Telephone Encounter (Signed)
Called son and scheduled bone marrow with Wilber Bihari, NP for 6/15 at 0730, instructed to arrive at 0720. Son verbalized understanding. Scheduling message sent for bone marrow and labs on 6/15. Called flow cytometry they are aware of above appt.

## 2019-06-27 DIAGNOSIS — D472 Monoclonal gammopathy: Secondary | ICD-10-CM | POA: Diagnosis not present

## 2019-06-27 DIAGNOSIS — C9000 Multiple myeloma not having achieved remission: Secondary | ICD-10-CM | POA: Diagnosis not present

## 2019-06-27 DIAGNOSIS — M899 Disorder of bone, unspecified: Secondary | ICD-10-CM | POA: Diagnosis not present

## 2019-06-27 DIAGNOSIS — I10 Essential (primary) hypertension: Secondary | ICD-10-CM | POA: Diagnosis not present

## 2019-06-27 DIAGNOSIS — M545 Low back pain: Secondary | ICD-10-CM | POA: Diagnosis not present

## 2019-06-27 LAB — BETA 2 MICROGLOBULIN, SERUM: Beta-2 Microglobulin: 3.8 mg/L — ABNORMAL HIGH (ref 0.6–2.4)

## 2019-06-27 NOTE — Telephone Encounter (Signed)
Received fax from pt's dentist regarding clearance for Zometa.  Also TCT his dentist for verbal clarification. Received medical clearance for pt to receive IV Zometa. Pt had dental exam on 06/25/19. Dr. Lorenso Courier made aware.

## 2019-06-28 ENCOUNTER — Other Ambulatory Visit: Payer: Self-pay | Admitting: Family Medicine

## 2019-06-28 ENCOUNTER — Telehealth: Payer: Self-pay | Admitting: Hematology and Oncology

## 2019-06-28 LAB — UPEP/UIFE/LIGHT CHAINS/TP, 24-HR UR
% BETA, Urine: 32.4 %
ALPHA 1 URINE: 3.4 %
Albumin, U: 24.5 %
Alpha 2, Urine: 15.8 %
Free Kappa Lt Chains,Ur: 18.04 mg/L (ref 0.63–113.79)
Free Kappa/Lambda Ratio: 9.97 (ref 1.03–31.76)
Free Lambda Lt Chains,Ur: 1.81 mg/L (ref 0.47–11.77)
GAMMA GLOBULIN URINE: 24 %
Total Protein, Urine-Ur/day: 112 mg/24 hr (ref 30–150)
Total Protein, Urine: 5.9 mg/dL
Total Volume: 1900

## 2019-06-28 NOTE — Telephone Encounter (Signed)
Scheduled per los. Called and spoke with patient. Confirmed appts  

## 2019-06-28 NOTE — Telephone Encounter (Signed)
Refilled px, but making pcp aware.  Orma Flaming, MD [place]

## 2019-06-29 LAB — IMMUNOFIXATION ELECTROPHORESIS
IgA: 171 mg/dL (ref 61–437)
IgG (Immunoglobin G), Serum: 1938 mg/dL — ABNORMAL HIGH (ref 603–1613)
IgM (Immunoglobulin M), Srm: 99 mg/dL (ref 15–143)
Total Protein ELP: 8 g/dL (ref 6.0–8.5)

## 2019-07-02 ENCOUNTER — Other Ambulatory Visit: Payer: Self-pay | Admitting: Hematology and Oncology

## 2019-07-02 DIAGNOSIS — D472 Monoclonal gammopathy: Secondary | ICD-10-CM

## 2019-07-03 ENCOUNTER — Other Ambulatory Visit: Payer: Self-pay

## 2019-07-03 ENCOUNTER — Inpatient Hospital Stay: Payer: Medicare Other

## 2019-07-03 ENCOUNTER — Inpatient Hospital Stay (HOSPITAL_BASED_OUTPATIENT_CLINIC_OR_DEPARTMENT_OTHER): Payer: Medicare Other | Admitting: Adult Health

## 2019-07-03 VITALS — BP 112/84 | HR 85 | Temp 98.8°F | Resp 18

## 2019-07-03 DIAGNOSIS — D472 Monoclonal gammopathy: Secondary | ICD-10-CM

## 2019-07-03 DIAGNOSIS — M545 Low back pain: Secondary | ICD-10-CM | POA: Diagnosis not present

## 2019-07-03 DIAGNOSIS — D649 Anemia, unspecified: Secondary | ICD-10-CM | POA: Diagnosis not present

## 2019-07-03 DIAGNOSIS — C9000 Multiple myeloma not having achieved remission: Secondary | ICD-10-CM

## 2019-07-03 DIAGNOSIS — M899 Disorder of bone, unspecified: Secondary | ICD-10-CM | POA: Diagnosis not present

## 2019-07-03 DIAGNOSIS — I10 Essential (primary) hypertension: Secondary | ICD-10-CM | POA: Diagnosis not present

## 2019-07-03 LAB — CBC WITH DIFFERENTIAL (CANCER CENTER ONLY)
Abs Immature Granulocytes: 0.03 10*3/uL (ref 0.00–0.07)
Basophils Absolute: 0 10*3/uL (ref 0.0–0.1)
Basophils Relative: 0 %
Eosinophils Absolute: 0.2 10*3/uL (ref 0.0–0.5)
Eosinophils Relative: 3 %
HCT: 36.5 % — ABNORMAL LOW (ref 39.0–52.0)
Hemoglobin: 11.9 g/dL — ABNORMAL LOW (ref 13.0–17.0)
Immature Granulocytes: 0 %
Lymphocytes Relative: 20 %
Lymphs Abs: 1.4 10*3/uL (ref 0.7–4.0)
MCH: 29.9 pg (ref 26.0–34.0)
MCHC: 32.6 g/dL (ref 30.0–36.0)
MCV: 91.7 fL (ref 80.0–100.0)
Monocytes Absolute: 0.5 10*3/uL (ref 0.1–1.0)
Monocytes Relative: 7 %
Neutro Abs: 4.8 10*3/uL (ref 1.7–7.7)
Neutrophils Relative %: 70 %
Platelet Count: 193 10*3/uL (ref 150–400)
RBC: 3.98 MIL/uL — ABNORMAL LOW (ref 4.22–5.81)
RDW: 13.1 % (ref 11.5–15.5)
WBC Count: 6.9 10*3/uL (ref 4.0–10.5)
nRBC: 0 % (ref 0.0–0.2)

## 2019-07-03 NOTE — Progress Notes (Signed)
INDICATION: multiple myeloma  Brief examination was performed. ENT: adequate airway clearance Heart: regular rate and rhythm.No Murmurs Lungs: clear to auscultation, no wheezes, normal respiratory effort  Bone Marrow Biopsy and Aspiration Procedure Note   Informed consent was obtained and potential risks including bleeding, infection and pain were reviewed with the patient.  The patient's name, date of birth, identification, consent and allergies were verified prior to the start of procedure and time out was performed.  The left posterior iliac crest was chosen as the site of biopsy.  The skin was prepped with ChloraPrep.   8 cc of 2% lidocaine was used to provide local anaesthesia.   10 cc of bone marrow aspirate was obtained followed by 1cm biopsy.  Pressure was applied to the biopsy site and bandage was placed over the biopsy site. Patient was made to lie on the back for 30 mins prior to discharge.  The procedure was tolerated well. COMPLICATIONS: None BLOOD LOSS: none The patient was discharged home in stable condition with a 2 week follow up to review results.  Patient was provided with post bone marrow biopsy instructions and instructed to call if there was any bleeding or worsening pain.  Specimens sent for flow cytometry, cytogenetics and additional studies.  Signed Scot Dock, NP

## 2019-07-03 NOTE — Patient Instructions (Signed)
Bone Marrow Aspiration and Bone Marrow Biopsy, Adult, Care After This sheet gives you information about how to care for yourself after your procedure. Your health care provider may also give you more specific instructions. If you have problems or questions, contact your health care provider. What can I expect after the procedure? After the procedure, it is common to have:  Mild pain and tenderness.  Swelling.  Bruising. Follow these instructions at home: Puncture site care   Follow instructions from your health care provider about how to take care of the puncture site. Make sure you: ? Wash your hands with soap and water before and after you change your bandage (dressing). If soap and water are not available, use hand sanitizer. ? Change your dressing as told by your health care provider.  Check your puncture site every day for signs of infection. Check for: ? More redness, swelling, or pain. ? Fluid or blood. ? Warmth. ? Pus or a bad smell. Activity  Return to your normal activities as told by your health care provider. Ask your health care provider what activities are safe for you.  Do not lift anything that is heavier than 10 lb (4.5 kg), or the limit that you are told, until your health care provider says that it is safe.  Do not drive for 24 hours if you were given a sedative during your procedure. General instructions   Take over-the-counter and prescription medicines only as told by your health care provider.  Do not take baths, swim, or use a hot tub until your health care provider approves. Ask your health care provider if you may take showers. You may only be allowed to take sponge baths.  If directed, put ice on the affected area. To do this: ? Put ice in a plastic bag. ? Place a towel between your skin and the bag. ? Leave the ice on for 20 minutes, 2-3 times a day.  Keep all follow-up visits as told by your health care provider. This is important. Contact a  health care provider if:  Your pain is not controlled with medicine.  You have a fever.  You have more redness, swelling, or pain around the puncture site.  You have fluid or blood coming from the puncture site.  Your puncture site feels warm to the touch.  You have pus or a bad smell coming from the puncture site. Summary  After the procedure, it is common to have mild pain, tenderness, swelling, and bruising.  Follow instructions from your health care provider about how to take care of the puncture site and what activities are safe for you.  Take over-the-counter and prescription medicines only as told by your health care provider.  Contact a health care provider if you have any signs of infection, such as fluid or blood coming from the puncture site. This information is not intended to replace advice given to you by your health care provider. Make sure you discuss any questions you have with your health care provider. Document Revised: 05/23/2018 Document Reviewed: 05/23/2018 Elsevier Patient Education  2020 [employer]vier Inc.  

## 2019-07-04 ENCOUNTER — Ambulatory Visit (HOSPITAL_COMMUNITY)
Admission: RE | Admit: 2019-07-04 | Discharge: 2019-07-04 | Disposition: A | Discharge status: Home / Self Care | Payer: Medicare Other | Source: Ambulatory Visit | Attending: Hematology and Oncology | Admitting: Hematology and Oncology

## 2019-07-04 ENCOUNTER — Telehealth: Payer: Self-pay | Admitting: Adult Health

## 2019-07-04 DIAGNOSIS — M899 Disorder of bone, unspecified: Secondary | ICD-10-CM | POA: Diagnosis not present

## 2019-07-04 NOTE — Telephone Encounter (Signed)
No 5/15 los. No changes made to pt's schedule.

## 2019-07-05 ENCOUNTER — Ambulatory Visit: Payer: Medicare Other | Admitting: Family Medicine

## 2019-07-05 LAB — SURGICAL PATHOLOGY

## 2019-07-09 ENCOUNTER — Other Ambulatory Visit: Payer: Self-pay

## 2019-07-09 ENCOUNTER — Inpatient Hospital Stay (HOSPITAL_BASED_OUTPATIENT_CLINIC_OR_DEPARTMENT_OTHER): Payer: Medicare Other | Admitting: Hematology and Oncology

## 2019-07-09 ENCOUNTER — Emergency Department (HOSPITAL_COMMUNITY): Payer: Medicare Other

## 2019-07-09 ENCOUNTER — Emergency Department (HOSPITAL_COMMUNITY)
Admission: EM | Admit: 2019-07-09 | Discharge: 2019-07-09 | Disposition: A | Discharge status: Home / Self Care | Payer: Medicare Other | Attending: Emergency Medicine | Admitting: Emergency Medicine

## 2019-07-09 ENCOUNTER — Encounter (HOSPITAL_COMMUNITY): Payer: Self-pay

## 2019-07-09 VITALS — BP 154/73 | HR 93 | Temp 98.2°F | Resp 18 | Ht 70.0 in | Wt 122.4 lb

## 2019-07-09 DIAGNOSIS — Z79899 Other long term (current) drug therapy: Secondary | ICD-10-CM | POA: Diagnosis not present

## 2019-07-09 DIAGNOSIS — S32010A Wedge compression fracture of first lumbar vertebra, initial encounter for closed fracture: Secondary | ICD-10-CM | POA: Insufficient documentation

## 2019-07-09 DIAGNOSIS — C9000 Multiple myeloma not having achieved remission: Secondary | ICD-10-CM | POA: Diagnosis not present

## 2019-07-09 DIAGNOSIS — Z7189 Other specified counseling: Secondary | ICD-10-CM | POA: Diagnosis not present

## 2019-07-09 DIAGNOSIS — I2510 Atherosclerotic heart disease of native coronary artery without angina pectoris: Secondary | ICD-10-CM | POA: Insufficient documentation

## 2019-07-09 DIAGNOSIS — S3992XA Unspecified injury of lower back, initial encounter: Secondary | ICD-10-CM | POA: Diagnosis present

## 2019-07-09 DIAGNOSIS — Y939 Activity, unspecified: Secondary | ICD-10-CM | POA: Insufficient documentation

## 2019-07-09 DIAGNOSIS — Z7982 Long term (current) use of aspirin: Secondary | ICD-10-CM | POA: Diagnosis not present

## 2019-07-09 DIAGNOSIS — Y999 Unspecified external cause status: Secondary | ICD-10-CM | POA: Insufficient documentation

## 2019-07-09 DIAGNOSIS — Z87891 Personal history of nicotine dependence: Secondary | ICD-10-CM | POA: Diagnosis not present

## 2019-07-09 DIAGNOSIS — Y929 Unspecified place or not applicable: Secondary | ICD-10-CM | POA: Diagnosis not present

## 2019-07-09 DIAGNOSIS — X58XXXA Exposure to other specified factors, initial encounter: Secondary | ICD-10-CM | POA: Diagnosis not present

## 2019-07-09 LAB — COMPREHENSIVE METABOLIC PANEL
ALT: 12 U/L (ref 0–44)
AST: 18 U/L (ref 15–41)
Albumin: 4.1 g/dL (ref 3.5–5.0)
Alkaline Phosphatase: 143 U/L — ABNORMAL HIGH (ref 38–126)
Anion gap: 10 (ref 5–15)
BUN: 22 mg/dL (ref 8–23)
CO2: 28 mmol/L (ref 22–32)
Calcium: 12 mg/dL — ABNORMAL HIGH (ref 8.9–10.3)
Chloride: 98 mmol/L (ref 98–111)
Creatinine, Ser: 1.23 mg/dL (ref 0.61–1.24)
GFR calc Af Amer: >60 mL/min (ref >60)
GFR calc non Af Amer: 57 mL/min — ABNORMAL LOW (ref >60)
Glucose, Bld: 112 mg/dL — ABNORMAL HIGH (ref 70–99)
Potassium: 3.8 mmol/L (ref 3.5–5.1)
Sodium: 136 mmol/L (ref 135–145)
Total Bilirubin: 0.7 mg/dL (ref 0.3–1.2)
Total Protein: 8.5 g/dL — ABNORMAL HIGH (ref 6.5–8.1)

## 2019-07-09 LAB — CBC WITH DIFFERENTIAL/PLATELET
Abs Immature Granulocytes: 0.01 10*3/uL (ref 0.00–0.07)
Basophils Absolute: 0 10*3/uL (ref 0.0–0.1)
Basophils Relative: 0 %
Eosinophils Absolute: 0.2 10*3/uL (ref 0.0–0.5)
Eosinophils Relative: 2 %
HCT: 40.1 % (ref 39.0–52.0)
Hemoglobin: 13 g/dL (ref 13.0–17.0)
Immature Granulocytes: 0 %
Lymphocytes Relative: 19 %
Lymphs Abs: 1.3 10*3/uL (ref 0.7–4.0)
MCH: 30.4 pg (ref 26.0–34.0)
MCHC: 32.4 g/dL (ref 30.0–36.0)
MCV: 93.9 fL (ref 80.0–100.0)
Monocytes Absolute: 0.5 10*3/uL (ref 0.1–1.0)
Monocytes Relative: 7 %
Neutro Abs: 5 10*3/uL (ref 1.7–7.7)
Neutrophils Relative %: 72 %
Platelets: 244 10*3/uL (ref 150–400)
RBC: 4.27 MIL/uL (ref 4.22–5.81)
RDW: 13 % (ref 11.5–15.5)
WBC: 6.9 10*3/uL (ref 4.0–10.5)
nRBC: 0 % (ref 0.0–0.2)

## 2019-07-09 MED ORDER — MORPHINE SULFATE (PF) 2 MG/ML IV SOLN
2.0000 mg | Freq: Once | INTRAVENOUS | Status: AC
  Administered 2019-07-09: 2 mg via INTRAVENOUS
  Filled 2019-07-09: qty 1

## 2019-07-09 NOTE — Discharge Instructions (Addendum)
You currently have been taking your 5 mg oxycodone medication every 6 hours as needed for symptoms of discomfort.  I spoke with your oncologist and you may take 10 mg oxycodone every 6 hours as needed with continued Tylenol as previously directed for additional pain relief.  Please follow-up with your primary care provider regarding today's encounter.  I would also like you to call Dr. Lorenso Courier to schedule appointment for ongoing evaluation and management of your multiple myeloma and related pain symptoms.  Return to the ED or seek immediate medical attention should you experience any new or worsening symptoms.

## 2019-07-09 NOTE — ED Provider Notes (Signed)
[facility] DEPT Provider Note   CSN: [id number] Arrival date & time: 07/09/19  1011     History Chief Complaint  Patient presents with  . Back Pain  . CA PT    Kevin Perez is a 74 y.o. male with PMH notable for recently diagnosed multiple myeloma who presents to the ED from the [facility] with complaints of worsening back pain accompanied by family.  Patient reportedly took his at home oxycodone at 6 AM without any relief.  I spoke with Dr. Lorenso Courier who informed me that patient arrived in 10 out of 10 pain which represented a large deviation from his typical pain symptoms.  Patient had a DG Bone Survey performed 07/04/2019 which revealed moderate compression fracture deformities T12-L2 as well as lytic lesions in the skull, left posterior ribs, and femurs bilaterally.  Given patient's worsening pain symptoms, Dr. Lorenso Courier was consistent with repeat films would be warranted.  Evidently there were no neurologic deficits on his exam.  On my examination, his son informs me that he has gone from taking a single oxycodone each day to three doses per day, without complete relief of his pain symptoms.  He is pointing to the area of his lumbar spine when describing his discomfort.  His son reports that he suspects that it is partially attributed to the fact that he is getting up frequently to go to the bathroom in the middle of the night.  He is still ambulatory without any assistance required.  They do endorse a 40 pound weight loss over the course of the past couple of years and states that his appetite is significantly diminished.  Patient denies any numbness or weakness, fevers or chills, cough, chest pain, abdominal discomfort, nausea or vomiting, blurred vision, or other neurologic deficits.  HPI     Past Medical History:  Diagnosis Date  . GERD 07/21/2006  . Gout   . HYPERLIPIDEMIA 06/17/2009  . HYPERTENSION 07/21/2006  . Raynaud's syndrome 07/21/2006  . Suicide  attempt [facility])    Due to Prednisone!  Marland Kitchen VASCULITIS 05/23/2007    Patient Active Problem List   Diagnosis Date Noted  . Multiple myeloma not having achieved remission ([place]) 07/03/2019  . Goals of care, counseling/discussion 06/25/2019  . Coronary atherosclerosis 06/20/2019  . Aortic atherosclerosis ([place]) 06/20/2019  . Protein-calorie malnutrition ([place]) 05/01/2019  . Former smoker 04/30/2014  . Testicular swelling, left 04/30/2014  . BPH associated with nocturia 04/30/2014  . Prepatellar bursitis of right knee 01/22/2014  . Gout 01/08/2014  . Insomnia 10/29/2013  . Senile cataracts of both eyes 08/22/2013  . 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 Macular degeneration 02/03/2011  . Hyperlipidemia 06/17/2009  . History of arteritis 05/23/2007  . History of suicide attempt- while on high dose steroids. avoid prednisone.  01/19/2007  . Essential hypertension 07/21/2006  . GERD 07/21/2006    Past Surgical History:  Procedure Laterality Date  . HERNIA REPAIR  07/27/87   umbilical hernia  . INGUINAL HERNIA REPAIR     Bil  . IR VERTEBROPLASTY LUMBAR BX INC UNI/BIL INC/INJECT/IMAGING  05/31/2019       Family History  Problem Relation Age of Onset  . Other Mother        passed age 68  . Hypertension Father   . Prostate cancer Father 76       never operated, not cause of death  . Stroke Father   . Meniere's disease  Father   . Early death Paternal Grandfather   . Bladder Cancer Son     Social History   Tobacco Use  . Smoking status: Former Smoker    Packs/day: 0.05    Years: 20.00    Pack years: 1.00    Types: Cigarettes  . Smokeless tobacco: Former Systems developer    Types: [place]ar date: 06/26/1989  . Tobacco comment: chewed tobacco until 89 more than he smoked   Vaping Use  . Vaping Use: Never used  Substance Use Topics  . Alcohol use: Yes    Alcohol/week: 2.0 standard drinks    Types: 2 Standard drinks or equivalent per  week    Comment: with meals on occasion  . Drug use: No    Home Medications Prior to Admission medications   Medication Sig Start Date End Date Taking? Authorizing Provider  acetaminophen (TYLENOL) 500 MG tablet Take 1,000 mg by mouth every 8 (eight) hours as needed for moderate pain.   Yes [provider]  allopurinol (ZYLOPRIM) 100 MG tablet Take 1 tablet (100 mg total) by mouth daily. 02/26/19  Yes [name], MD  calcium carbonate (TUMS - DOSED IN MG ELEMENTAL CALCIUM) 500 MG chewable tablet Chew 1 tablet by mouth as needed for indigestion or heartburn.   Yes [provider]  indomethacin (INDOCIN) 25 MG capsule TAKE ONE CAPSULE BY MOUTH 3 TIMES A DAY AS NEEDED Patient taking differently: Take 25 mg by mouth 3 (three) times daily as needed for mild pain (gout). TAKE ONE CAPSULE BY MOUTH 3 TIMES A DAY AS NEEDED 02/26/19  Yes [name], MD  losartan (COZAAR) 100 MG tablet Take 0.5 tablets (50 mg total) by mouth daily. 05/01/19  Yes [name], MD  mirtazapine (REMERON) 15 MG tablet TAKE 1 TABLET BY MOUTH EVERYDAY AT BEDTIME Patient taking differently: Take 15 mg by mouth at bedtime.  06/28/19  Yes Orma Flaming, MD  oxyCODONE (OXY IR/ROXICODONE) 5 MG immediate release tablet Take 1 tablet (5 mg total) by mouth every 6 (six) hours as needed for severe pain. 06/25/19  Yes Orson Slick, MD  pantoprazole (PROTONIX) 40 MG tablet Take 1 tablet (40 mg total) by mouth daily. 06/06/19  Yes Orma Flaming, MD  sennosides-docusate sodium (SENOKOT-S) 8.6-50 MG tablet Take 1 tablet by mouth daily as needed for constipation.   Yes [provider]  tamsulosin (FLOMAX) 0.4 MG CAPS capsule TAKE 1 CAPSULE BY MOUTH EVERY DAY Patient taking differently: Take 0.4 mg by mouth daily.  05/17/19  Yes [name], MD  aspirin EC 81 MG tablet Take 81 mg by mouth daily.    [provider]  atorvastatin (LIPITOR) 40 MG tablet Take 1 tablet (40 mg total) by mouth  3 (three) times a week. 02/28/19   [name], MD  clonazePAM (KLONOPIN) 1 MG tablet TAKE 1/2 TO 1 TABLET BY MOUTH EVERY DAY AS NEEDED Patient not taking: Reported on 07/09/2019 04/30/19   [name], MD  oxyCODONE-acetaminophen (PERCOCET) 7.5-325 MG tablet Take 1 tablet by mouth every 6 (six) hours as needed for severe pain (with likely multiple myeloma). Patient not taking: Reported on 07/09/2019 06/20/19   [name], MD    Allergies    Prednisone, Amoxicillin-pot clavulanate, Ace inhibitors, and Codeine phosphate  Review of Systems   Review of Systems  All other systems reviewed and are negative.   Physical Exam Updated Vital Signs BP 122/71  Pulse 77   Temp 98.3 F (36.8 C) (Oral)   Resp 16   SpO2 97%   Physical Exam Vitals and nursing note reviewed. Exam conducted with a chaperone present.  Constitutional:      Comments: Cachectic.  HENT:     Head: Normocephalic and atraumatic.  Eyes:     General: No scleral icterus.    Conjunctiva/sclera: Conjunctivae normal.  Cardiovascular:     Rate and Rhythm: Normal rate and regular rhythm.     Pulses: Normal pulses.     Heart sounds: Normal heart sounds.  Pulmonary:     Effort: Pulmonary effort is normal. No respiratory distress.     Breath sounds: Normal breath sounds.  Musculoskeletal:     Cervical back: Normal range of motion. No rigidity or tenderness.     Comments: TTP along midline lumbar spine.  No significant TTP appreciated in cervical or thoracic region.  Can range all extremities without difficulty.  Sensation intact throughout.  Peripheral pulses intact.  Skin:    General: Skin is dry.     Capillary Refill: Capillary refill takes less than 2 seconds.  Neurological:     Mental Status: He is alert and oriented to person, place, and time.     GCS: GCS eye subscore is 4. GCS verbal subscore is 5. GCS motor subscore is 6.  Psychiatric:        Mood and Affect: Mood normal.        Behavior: Behavior  normal.        Thought Content: Thought content normal.     ED Results / Procedures / Treatments   Labs (all labs ordered are listed, but only abnormal results are displayed) Labs Reviewed  COMPREHENSIVE METABOLIC PANEL - Abnormal; Notable for the following components:      Result Value   Glucose, Bld 112 (*)    Calcium 12.0 (*)    Total Protein 8.5 (*)    Alkaline Phosphatase 143 (*)    GFR calc non Af Amer 57 (*)    All other components within normal limits  CBC WITH DIFFERENTIAL/PLATELET    EKG None  Radiology DG Lumbar Spine Complete  Result Date: 07/09/2019 CLINICAL DATA:  Worsening pain, multiple myeloma EXAM: LUMBAR SPINE - COMPLETE 4+ VIEW COMPARISON:  06/06/2019 FINDINGS: Severe osteopenia. Chronic L4 vertebral body compression fracture with prior augmentation and approximately 70% height loss. 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 Severe L1 vertebral body compression fracture with increased height loss compared with 06/06/2019 likely reflecting a acute-subacute fracture superimposed upon a chronic fracture with currently approximately 80% height loss. Degenerative disease with severe disc height loss and endplate sclerosis at Z6-X0. Diffuse bilateral facet arthropathy of the lumbar spine. IMPRESSION: 1. Severe L1 vertebral body compression fracture with increased height loss compared with 06/06/2019 likely reflecting an acute-subacute fracture superimposed upon a chronic fracture with currently approximately 80% height loss. 2. 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 Electronically Signed   By: Kathreen Devoid   On: 07/09/2019 11:17    Procedures Procedures (including critical care time)  Medications Ordered in ED Medications  morphine 2 MG/ML injection 2 mg (2 mg Intravenous Given 07/09/19 1112)    ED Course  I have reviewed the triage vital signs and the nursing notes.  Pertinent labs & imaging results that were  available during my care of the patient were reviewed by me and considered in my medical decision making (see chart for  details).    MDM Rules/Calculators/A&P                          We will provide patient with pain medication here in the ED and obtain repeat films given his dramatically worsening low back pain.  Patient had kyphoplasty performed 05/31/2019 without significant relief of his discomfort.  I reviewed patient's medical record and there is an office visit 06/20/2019 with Dr. Yong Channel, family medicine, who notes that patient was reporting significant 10 out of 10 back pain despite his prescribed tramadol which is what precipitated the prescription of Vicodin 7.5-325 every 6 hours.  He also recently had a left posterior iliac crest bone marrow biopsy and aspiration performed 07/03/2019.    Do not feel as though MRI is warranted as there is no evidence to suggest cord compression or myelopathy.  His neurologic exam is entirely unremarkable.  He can ambulate without ataxia or difficulty and his legs have no weakness or numbness.  Laboratory work-up is unremarkable aside from hypercalcemia which is consistent with his disease process and recent labs obtained.  Plain films obtained of lumbar spine demonstrate a severe L1 vertebral body compression fracture when compared to last films obtained.  Will consult neurosurgery to determine whether or not they feel as though TLSO brace would provide any additional relief or benefit.  Patient has been provided 2 mg IV morphine and on subsequent evaluation was feeling entirely improved.  I spoke with Dr. Arnoldo Morale who agrees with idea of TLSO brace purely for comfort.  Does not feel this patient would be a good surgical candidate, particularly considering prior kyphoplasty performed by fail to improve his symptoms of discomfort.    I spoke with Dr. Lorenso Courier who will follow-up with him on outpatient basis.  Patient's pain is well controlled now and do not feel  admission for pain control was immediately warranted.  Unfortunately his symptoms will continue to progress and this is likely going to require comfort measures.  Dr. Lorenso Courier recommends no more than 10 mg oxycodone every 6 hours with alternating 1 g Tylenol every 8 hours for pain control.    Patient was able to ambulate here in the ED successfully after TLSO brace placed.  Strict ED return precautions discussed with patient. They were provided opportunity to ask any additional questions and have none at this time. They have expressed understanding of verbal discharge instructions as well as return precautions and are agreeable to the plan.    Final Clinical Impression(s) / ED Diagnoses Final diagnoses:  Multiple myeloma not having achieved remission ([place])  Compression fracture of L1 lumbar vertebra, closed, initial encounter [facility])    Rx / DC Orders ED Discharge Orders    None       Corena Herter, PA-C 07/09/19 1458    Maudie Flakes, MD 07/09/19 [phone]

## 2019-07-09 NOTE — ED Notes (Signed)
Spoke to ortho tech. TSLO brace should be here within the hour.

## 2019-07-09 NOTE — Progress Notes (Signed)
Orthopedic Tech Progress Note Patient Details:  Kevin Perez [facility] [birth year] [id number]  Patient ID: Kevin Perez, male   DOB: 08-13-1945, 74 y.o.   MRN: [id number]   Kevin Perez 07/09/2019, 1:16 PMCalled and Routed TLSO bace order to [employer].

## 2019-07-09 NOTE — ED Triage Notes (Signed)
Pt from [place] dx with multiple myeloma today, c/o worsening back pain x3 days.   AOx4, ambulatory.   Family arrives with pt.  Pt reports taking oxycodone at 0600 today, no relief.

## 2019-07-09 NOTE — ED Notes (Signed)
Discharge instructions reviewed with patient and son and verbalized understanding.

## 2019-07-09 NOTE — Progress Notes (Signed)
Pt transported to ED room #15 via w/c for pain management. Pt's son in [place]. Assited pt in changing his clothes to a hospital gown. Report given to [place], [place]

## 2019-07-09 NOTE — ED Notes (Signed)
Patient ambulated very well in room. Adjusting to new brace.

## 2019-07-10 ENCOUNTER — Encounter: Payer: Self-pay | Admitting: Hematology and Oncology

## 2019-07-10 NOTE — Progress Notes (Signed)
START ON PATHWAY REGIMEN - Multiple Myeloma and Other Plasma Cell Dyscrasias     A cycle is every 21 days:     Bortezomib      Lenalidomide      Dexamethasone   **Always confirm dose/schedule in your pharmacy ordering system**  Patient Characteristics: Multiple Myeloma, Newly Diagnosed, Transplant Ineligible or Refused, Unknown or Awaiting Test Results Disease Classification: Multiple Myeloma R-ISS Staging: II Therapeutic Status: Newly Diagnosed Is Patient Eligible for Transplant<= Transplant Ineligible or Refused Risk Status: Awaiting Test Results Intent of Therapy: Non-Curative / Palliative Intent, Discussed with Patient 
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
## 2019-07-11 ENCOUNTER — Telehealth: Payer: Self-pay | Admitting: *Deleted

## 2019-07-11 NOTE — Telephone Encounter (Signed)
TCT patient regarding initiating Revlimid treatment for his Multiple myeloma. Spoke with son, Kevin Perez, with patient's verbal permission. Completed on line patient/physician agreement form via phone. Pt is in agreement to start Revlimid. Form completed. Dr. Lorenso Courier has reviewed the medication with son and patient.  Patient and son are aware of monthly survey and that this RN can do this for patient via phone.  They are in agreement. Copy of agreement and patient education material mailed to patient and his son, Kevin Perez.

## 2019-07-12 ENCOUNTER — Encounter (HOSPITAL_COMMUNITY): Payer: Self-pay | Admitting: Hematology and Oncology

## 2019-07-12 ENCOUNTER — Telehealth: Payer: Self-pay | Admitting: *Deleted

## 2019-07-12 ENCOUNTER — Other Ambulatory Visit: Payer: Self-pay | Admitting: *Deleted

## 2019-07-12 MED ORDER — LENALIDOMIDE 25 MG PO CAPS
25.0000 mg | ORAL_CAPSULE | Freq: Every day | ORAL | 0 refills | Status: DC
Start: 2019-07-12 — End: 2019-07-13

## 2019-07-12 NOTE — Telephone Encounter (Signed)
Completed pt's sign up for oral Revlimid. Confirmed with [employer]. Obtained auth # Prescription left in oral chemo folder

## 2019-07-13 ENCOUNTER — Telehealth: Payer: Self-pay | Admitting: Pharmacist

## 2019-07-13 ENCOUNTER — Telehealth: Payer: Self-pay

## 2019-07-13 ENCOUNTER — Encounter (HOSPITAL_COMMUNITY): Payer: Self-pay | Admitting: Hematology and Oncology

## 2019-07-13 DIAGNOSIS — C9000 Multiple myeloma not having achieved remission: Secondary | ICD-10-CM

## 2019-07-13 MED ORDER — LENALIDOMIDE 10 MG PO CAPS: 10.0000 mg | ORAL_CAPSULE | Freq: Every day | ORAL | 0 refills | Status: DC

## 2019-07-13 NOTE — Telephone Encounter (Signed)
Oral Oncology Pharmacist Encounter  Received new prescription for Revlimid (lenalidomide) for the treatment of multiple myeloma in conjunction with bortezomib and dexamethasone, planned duration until disease control or unacceptable drug toxicity. Planned start after 07/18/19.  CMP from 07/09/19 assessed, SCr 1.'23mg'$ /dL and CrCl ~41 mL/min. Dose reduced due to renal function. Prescription dose and frequency assessed.   Current medication list in Epic reviewed, no DDIs with lenalidomide identified.  Prescription has been e-scribed to [place] for benefits analysis and approval.  Oral Oncology Clinic will continue to follow for insurance authorization, copayment issues, initial counseling and start date.  Darl Pikes, PharmD, BCPS, BCOP, CPP Hematology/Oncology Clinical Pharmacist Practitioner [facility] [phone]  07/13/2019 8:37 AM

## 2019-07-13 NOTE — Telephone Encounter (Signed)
Oral Oncology Patient Advocate Encounter  Prior Authorization for Revlimid has been approved.    PA# [id number] Effective dates: 07/13/19 through 07/12/20   Oral Oncology Clinic will continue to follow.   [place] Patient [place] Phone [phone] Fax [phone] 07/13/2019 9:03 AM

## 2019-07-13 NOTE — Telephone Encounter (Signed)
Oral Chemotherapy Pharmacist Encounter  Spoke to patient's son Kevin Perez, he know the Revlimid prescription was sent to [facility]. Provided him with the phone number.  Patient Education I spoke with Kevin Perez for overview of new oral chemotherapy medication: Revlimid (lenalidomide) for the treatment of multiple myeloma in conjunction with bortezomib and dexamethasone, planned duration until disease control or unacceptable drug toxicity. Planned start after 07/18/19.   Counseled Kevin Perez on administration, dosing, side effects, monitoring, drug-food interactions, safe handling, storage, and disposal. Patient will take 1 capsule (10 mg total) by mouth daily. Take for 14 days, then hold for 7 days. Repeat every 21 days.  Side effects include but not limited to: rash/itchy skin, N/V, fatigue, decreased wbc/hgb/plt, diarrhea or constipation.    Reviewed with Kevin Perez importance of keeping a medication schedule and plan for any missed doses.  Kevin Perez voiced understanding and appreciation. All questions answered. Medication handout placed in the mail.  Provided [facility] with Oral Chemotherapy Navigation Clinic phone number. Kevin Perez knows to call the office with questions or concerns. Oral Chemotherapy Navigation Clinic will continue to follow.  Darl Pikes, PharmD, BCPS, BCOP, CPP Hematology/Oncology Clinical Pharmacist Practitioner [facility] [phone]  07/13/2019 12:01 PM

## 2019-07-13 NOTE — Telephone Encounter (Signed)
Oral Oncology Patient Advocate Encounter  Received notification from [place] that prior authorization for Revlimid is required.  PA submitted on CoverMyMeds Key [id number] Status is pending  Oral Oncology Clinic will continue to follow.  [place] Patient [place] Phone [phone] Fax [phone] 07/13/2019 8:24 AM

## 2019-07-13 NOTE — Telephone Encounter (Signed)
Oral Chemotherapy Pharmacist Encounter   Attempted to cal patient/son for Revlimid edu and to let them know Rx was sent to [facility]. No answer, LVM for them to call me back.  Darl Pikes, PharmD, BCPS, BCOP, CPP Hematology/Oncology Clinical Pharmacist [facility] [phone]  07/13/2019 11:10 AM

## 2019-07-16 ENCOUNTER — Encounter (HOSPITAL_COMMUNITY): Payer: Self-pay | Admitting: Emergency Medicine

## 2019-07-16 ENCOUNTER — Ambulatory Visit: Payer: Medicare Other | Admitting: Hematology and Oncology

## 2019-07-16 ENCOUNTER — Inpatient Hospital Stay (HOSPITAL_COMMUNITY)
Admission: EM | Admit: 2019-07-16 | Discharge: 2019-07-20 | DRG: 064 | Disposition: A | Discharge status: Skilled Nursing Facility | Payer: Medicare Other | Attending: Internal Medicine | Admitting: Internal Medicine

## 2019-07-16 ENCOUNTER — Inpatient Hospital Stay (HOSPITAL_COMMUNITY): Payer: Medicare Other

## 2019-07-16 ENCOUNTER — Other Ambulatory Visit: Payer: Medicare Other

## 2019-07-16 ENCOUNTER — Other Ambulatory Visit: Payer: Self-pay

## 2019-07-16 ENCOUNTER — Encounter: Payer: Self-pay | Admitting: Hematology and Oncology

## 2019-07-16 ENCOUNTER — Emergency Department (HOSPITAL_COMMUNITY): Payer: Medicare Other

## 2019-07-16 DIAGNOSIS — I63113 Cerebral infarction due to embolism of bilateral vertebral arteries: Secondary | ICD-10-CM | POA: Diagnosis not present

## 2019-07-16 DIAGNOSIS — I63412 Cerebral infarction due to embolism of left middle cerebral artery: Principal | ICD-10-CM | POA: Diagnosis present

## 2019-07-16 DIAGNOSIS — Z880 Allergy status to penicillin: Secondary | ICD-10-CM

## 2019-07-16 DIAGNOSIS — Z8249 Family history of ischemic heart disease and other diseases of the circulatory system: Secondary | ICD-10-CM

## 2019-07-16 DIAGNOSIS — S32000S Wedge compression fracture of unspecified lumbar vertebra, sequela: Secondary | ICD-10-CM

## 2019-07-16 DIAGNOSIS — I776 Arteritis, unspecified: Secondary | ICD-10-CM | POA: Diagnosis present

## 2019-07-16 DIAGNOSIS — R351 Nocturia: Secondary | ICD-10-CM

## 2019-07-16 DIAGNOSIS — I10 Essential (primary) hypertension: Secondary | ICD-10-CM | POA: Diagnosis not present

## 2019-07-16 DIAGNOSIS — F419 Anxiety disorder, unspecified: Secondary | ICD-10-CM | POA: Diagnosis not present

## 2019-07-16 DIAGNOSIS — E785 Hyperlipidemia, unspecified: Secondary | ICD-10-CM | POA: Diagnosis not present

## 2019-07-16 DIAGNOSIS — E871 Hypo-osmolality and hyponatremia: Secondary | ICD-10-CM | POA: Diagnosis not present

## 2019-07-16 DIAGNOSIS — I639 Cerebral infarction, unspecified: Secondary | ICD-10-CM

## 2019-07-16 DIAGNOSIS — R636 Underweight: Secondary | ICD-10-CM | POA: Diagnosis present

## 2019-07-16 DIAGNOSIS — I25119 Atherosclerotic heart disease of native coronary artery with unspecified angina pectoris: Secondary | ICD-10-CM | POA: Diagnosis not present

## 2019-07-16 DIAGNOSIS — M1A0790 Idiopathic chronic gout, unspecified ankle and foot, without tophus (tophi): Secondary | ICD-10-CM | POA: Diagnosis not present

## 2019-07-16 DIAGNOSIS — Z8679 Personal history of other diseases of the circulatory system: Secondary | ICD-10-CM | POA: Diagnosis not present

## 2019-07-16 DIAGNOSIS — I69991 Dysphagia following unspecified cerebrovascular disease: Secondary | ICD-10-CM | POA: Diagnosis not present

## 2019-07-16 DIAGNOSIS — I5032 Chronic diastolic (congestive) heart failure: Secondary | ICD-10-CM | POA: Diagnosis not present

## 2019-07-16 DIAGNOSIS — E46 Unspecified protein-calorie malnutrition: Secondary | ICD-10-CM | POA: Diagnosis not present

## 2019-07-16 DIAGNOSIS — I6340 Cerebral infarction due to embolism of unspecified cerebral artery: Secondary | ICD-10-CM | POA: Diagnosis not present

## 2019-07-16 DIAGNOSIS — H40003 Preglaucoma, unspecified, bilateral: Secondary | ICD-10-CM | POA: Diagnosis not present

## 2019-07-16 DIAGNOSIS — I130 Hypertensive heart and chronic kidney disease with heart failure and stage 1 through stage 4 chronic kidney disease, or unspecified chronic kidney disease: Secondary | ICD-10-CM | POA: Diagnosis present

## 2019-07-16 DIAGNOSIS — E876 Hypokalemia: Secondary | ICD-10-CM | POA: Diagnosis not present

## 2019-07-16 DIAGNOSIS — Z79899 Other long term (current) drug therapy: Secondary | ICD-10-CM

## 2019-07-16 DIAGNOSIS — Z681 Body mass index (BMI) 19 or less, adult: Secondary | ICD-10-CM | POA: Diagnosis not present

## 2019-07-16 DIAGNOSIS — R278 Other lack of coordination: Secondary | ICD-10-CM | POA: Diagnosis not present

## 2019-07-16 DIAGNOSIS — I63432 Cerebral infarction due to embolism of left posterior cerebral artery: Secondary | ICD-10-CM | POA: Diagnosis present

## 2019-07-16 DIAGNOSIS — N401 Enlarged prostate with lower urinary tract symptoms: Secondary | ICD-10-CM | POA: Diagnosis not present

## 2019-07-16 DIAGNOSIS — Z8673 Personal history of transient ischemic attack (TIA), and cerebral infarction without residual deficits: Secondary | ICD-10-CM | POA: Diagnosis present

## 2019-07-16 DIAGNOSIS — I69398 Other sequelae of cerebral infarction: Secondary | ICD-10-CM | POA: Diagnosis not present

## 2019-07-16 DIAGNOSIS — C9000 Multiple myeloma not having achieved remission: Secondary | ICD-10-CM

## 2019-07-16 DIAGNOSIS — M109 Gout, unspecified: Secondary | ICD-10-CM | POA: Diagnosis present

## 2019-07-16 DIAGNOSIS — I69318 Other symptoms and signs involving cognitive functions following cerebral infarction: Secondary | ICD-10-CM | POA: Diagnosis not present

## 2019-07-16 DIAGNOSIS — K5900 Constipation, unspecified: Secondary | ICD-10-CM | POA: Diagnosis present

## 2019-07-16 DIAGNOSIS — N400 Enlarged prostate without lower urinary tract symptoms: Secondary | ICD-10-CM | POA: Diagnosis not present

## 2019-07-16 DIAGNOSIS — I7300 Raynaud's syndrome without gangrene: Secondary | ICD-10-CM | POA: Diagnosis present

## 2019-07-16 DIAGNOSIS — Z915 Personal history of self-harm: Secondary | ICD-10-CM

## 2019-07-16 DIAGNOSIS — Z823 Family history of stroke: Secondary | ICD-10-CM

## 2019-07-16 DIAGNOSIS — K219 Gastro-esophageal reflux disease without esophagitis: Secondary | ICD-10-CM | POA: Diagnosis not present

## 2019-07-16 DIAGNOSIS — I6389 Other cerebral infarction: Secondary | ICD-10-CM | POA: Diagnosis not present

## 2019-07-16 DIAGNOSIS — I2510 Atherosclerotic heart disease of native coronary artery without angina pectoris: Secondary | ICD-10-CM | POA: Diagnosis present

## 2019-07-16 DIAGNOSIS — N1831 Chronic kidney disease, stage 3a: Secondary | ICD-10-CM

## 2019-07-16 DIAGNOSIS — Z981 Arthrodesis status: Secondary | ICD-10-CM

## 2019-07-16 DIAGNOSIS — Z7982 Long term (current) use of aspirin: Secondary | ICD-10-CM | POA: Diagnosis not present

## 2019-07-16 DIAGNOSIS — N183 Chronic kidney disease, stage 3 unspecified: Secondary | ICD-10-CM | POA: Diagnosis present

## 2019-07-16 DIAGNOSIS — H269 Unspecified cataract: Secondary | ICD-10-CM | POA: Diagnosis not present

## 2019-07-16 DIAGNOSIS — H3530 Unspecified macular degeneration: Secondary | ICD-10-CM | POA: Diagnosis not present

## 2019-07-16 DIAGNOSIS — E43 Unspecified severe protein-calorie malnutrition: Secondary | ICD-10-CM | POA: Diagnosis present

## 2019-07-16 DIAGNOSIS — Z973 Presence of spectacles and contact lenses: Secondary | ICD-10-CM

## 2019-07-16 DIAGNOSIS — Z8616 Personal history of COVID-19: Secondary | ICD-10-CM | POA: Diagnosis not present

## 2019-07-16 DIAGNOSIS — I959 Hypotension, unspecified: Secondary | ICD-10-CM | POA: Diagnosis present

## 2019-07-16 DIAGNOSIS — Z87891 Personal history of nicotine dependence: Secondary | ICD-10-CM

## 2019-07-16 DIAGNOSIS — Z8042 Family history of malignant neoplasm of prostate: Secondary | ICD-10-CM

## 2019-07-16 DIAGNOSIS — I700 Atherosclerosis of aorta: Secondary | ICD-10-CM | POA: Diagnosis not present

## 2019-07-16 DIAGNOSIS — S32000A Wedge compression fracture of unspecified lumbar vertebra, initial encounter for closed fracture: Secondary | ICD-10-CM | POA: Diagnosis present

## 2019-07-16 DIAGNOSIS — M6281 Muscle weakness (generalized): Secondary | ICD-10-CM | POA: Diagnosis not present

## 2019-07-16 DIAGNOSIS — Z888 Allergy status to other drugs, medicaments and biological substances status: Secondary | ICD-10-CM

## 2019-07-16 DIAGNOSIS — Z8052 Family history of malignant neoplasm of bladder: Secondary | ICD-10-CM

## 2019-07-16 HISTORY — DX: Malignant (primary) neoplasm, unspecified: C80.1

## 2019-07-16 LAB — URINALYSIS, ROUTINE W REFLEX MICROSCOPIC
Bilirubin Urine: NEGATIVE
Glucose, UA: NEGATIVE mg/dL
Hgb urine dipstick: NEGATIVE
Ketones, ur: NEGATIVE mg/dL
Nitrite: NEGATIVE
Protein, ur: NEGATIVE mg/dL
Specific Gravity, Urine: 1.011 (ref 1.005–1.030)
pH: 6 (ref 5.0–8.0)

## 2019-07-16 LAB — SEDIMENTATION RATE: Sed Rate: 61 mm/hr — ABNORMAL HIGH (ref 0–16)

## 2019-07-16 LAB — CBC WITH DIFFERENTIAL/PLATELET
Abs Immature Granulocytes: 0.02 10*3/uL (ref 0.00–0.07)
Basophils Absolute: 0 10*3/uL (ref 0.0–0.1)
Basophils Relative: 0 %
Eosinophils Absolute: 0 10*3/uL (ref 0.0–0.5)
Eosinophils Relative: 0 %
HCT: 32.9 % — ABNORMAL LOW (ref 39.0–52.0)
Hemoglobin: 11.1 g/dL — ABNORMAL LOW (ref 13.0–17.0)
Immature Granulocytes: 0 %
Lymphocytes Relative: 12 %
Lymphs Abs: 0.9 10*3/uL (ref 0.7–4.0)
MCH: 30.9 pg (ref 26.0–34.0)
MCHC: 33.7 g/dL (ref 30.0–36.0)
MCV: 91.6 fL (ref 80.0–100.0)
Monocytes Absolute: 0.6 10*3/uL (ref 0.1–1.0)
Monocytes Relative: 8 %
Neutro Abs: 6.5 10*3/uL (ref 1.7–7.7)
Neutrophils Relative %: 80 %
Platelets: 236 10*3/uL (ref 150–400)
RBC: 3.59 MIL/uL — ABNORMAL LOW (ref 4.22–5.81)
RDW: 12.6 % (ref 11.5–15.5)
WBC: 8.1 10*3/uL (ref 4.0–10.5)
nRBC: 0 % (ref 0.0–0.2)

## 2019-07-16 LAB — PROTIME-INR
INR: 1.1 (ref 0.8–1.2)
Prothrombin Time: 13.9 seconds (ref 11.4–15.2)

## 2019-07-16 LAB — COMPREHENSIVE METABOLIC PANEL
ALT: 13 U/L (ref 0–44)
AST: 19 U/L (ref 15–41)
Albumin: 3.6 g/dL (ref 3.5–5.0)
Alkaline Phosphatase: 128 U/L — ABNORMAL HIGH (ref 38–126)
Anion gap: 9 (ref 5–15)
BUN: 24 mg/dL — ABNORMAL HIGH (ref 8–23)
CO2: 32 mmol/L (ref 22–32)
Calcium: 11.7 mg/dL — ABNORMAL HIGH (ref 8.9–10.3)
Chloride: 93 mmol/L — ABNORMAL LOW (ref 98–111)
Creatinine, Ser: 1.27 mg/dL — ABNORMAL HIGH (ref 0.61–1.24)
GFR calc Af Amer: >60 mL/min (ref >60)
GFR calc non Af Amer: 55 mL/min — ABNORMAL LOW (ref >60)
Glucose, Bld: 130 mg/dL — ABNORMAL HIGH (ref 70–99)
Potassium: 3.6 mmol/L (ref 3.5–5.1)
Sodium: 134 mmol/L — ABNORMAL LOW (ref 135–145)
Total Bilirubin: 0.6 mg/dL (ref 0.3–1.2)
Total Protein: 8 g/dL (ref 6.5–8.1)

## 2019-07-16 LAB — RAPID URINE DRUG SCREEN, HOSP PERFORMED
Amphetamines: NOT DETECTED
Barbiturates: NOT DETECTED
Benzodiazepines: NOT DETECTED
Cocaine: NOT DETECTED
Opiates: NOT DETECTED
Tetrahydrocannabinol: NOT DETECTED

## 2019-07-16 LAB — C-REACTIVE PROTEIN: CRP: 7.7 mg/dL — ABNORMAL HIGH (ref <1.0)

## 2019-07-16 LAB — SARS CORONAVIRUS 2 BY RT PCR (HOSPITAL ORDER, PERFORMED IN ~~LOC~~ HOSPITAL LAB): SARS Coronavirus 2: NEGATIVE

## 2019-07-16 LAB — APTT: aPTT: 29 seconds (ref 24–36)

## 2019-07-16 MED ORDER — MIRTAZAPINE 7.5 MG PO TABS
15.0000 mg | ORAL_TABLET | Freq: Every day | ORAL | Status: DC
  Administered 2019-07-17 – 2019-07-19 (×3): 15 mg via ORAL
  Filled 2019-07-16 (×5): qty 2

## 2019-07-16 MED ORDER — LENALIDOMIDE 10 MG PO CAPS: 10.0000 mg | ORAL_CAPSULE | Freq: Every day | ORAL | 0 refills | Status: DC

## 2019-07-16 MED ORDER — ACETAMINOPHEN 500 MG PO TABS: 1000.0000 mg | ORAL_TABLET | Freq: Three times a day (TID) | ORAL | Status: DC | PRN

## 2019-07-16 MED ORDER — ACETAMINOPHEN 650 MG RE SUPP: 650.0000 mg | RECTAL | Status: DC | PRN

## 2019-07-16 MED ORDER — STROKE: EARLY STAGES OF RECOVERY BOOK
Freq: Once | Status: AC
  Filled 2019-07-16 (×2): qty 1

## 2019-07-16 MED ORDER — SENNOSIDES-DOCUSATE SODIUM 8.6-50 MG PO TABS
1.0000 | ORAL_TABLET | Freq: Every evening | ORAL | Status: DC | PRN
  Administered 2019-07-17: 1 via ORAL
  Filled 2019-07-16: qty 1

## 2019-07-16 MED ORDER — ASPIRIN 325 MG PO TABS
325.0000 mg | ORAL_TABLET | Freq: Every day | ORAL | Status: DC
  Administered 2019-07-16: 325 mg via ORAL
  Filled 2019-07-16: qty 1

## 2019-07-16 MED ORDER — GADOBUTROL 1 MMOL/ML IV SOLN
5.0000 mL | Freq: Once | INTRAVENOUS | Status: AC | PRN
  Administered 2019-07-16: 5 mL via INTRAVENOUS

## 2019-07-16 MED ORDER — ACETAMINOPHEN 325 MG PO TABS: 650.0000 mg | ORAL_TABLET | ORAL | Status: DC | PRN

## 2019-07-16 MED ORDER — LORAZEPAM 2 MG/ML IJ SOLN
0.5000 mg | Freq: Once | INTRAMUSCULAR | Status: AC
  Administered 2019-07-16: 0.5 mg via INTRAVENOUS
  Filled 2019-07-16: qty 1

## 2019-07-16 MED ORDER — SODIUM CHLORIDE 0.9 % IV SOLN: INTRAVENOUS | Status: AC

## 2019-07-16 MED ORDER — HEPARIN SODIUM (PORCINE) 5000 UNIT/ML IJ SOLN
5000.0000 [IU] | Freq: Three times a day (TID) | INTRAMUSCULAR | Status: DC
  Administered 2019-07-16 – 2019-07-20 (×12): 5000 [IU] via SUBCUTANEOUS
  Filled 2019-07-16 (×12): qty 1

## 2019-07-16 MED ORDER — ACETAMINOPHEN 500 MG PO TABS
1000.0000 mg | ORAL_TABLET | Freq: Three times a day (TID) | ORAL | Status: DC
  Administered 2019-07-17 – 2019-07-20 (×10): 1000 mg via ORAL
  Filled 2019-07-16 (×11): qty 2

## 2019-07-16 MED ORDER — PANTOPRAZOLE SODIUM 40 MG PO TBEC
40.0000 mg | DELAYED_RELEASE_TABLET | Freq: Every day | ORAL | Status: DC
  Administered 2019-07-18 – 2019-07-20 (×3): 40 mg via ORAL
  Filled 2019-07-16 (×3): qty 1

## 2019-07-16 MED ORDER — SENNOSIDES-DOCUSATE SODIUM 8.6-50 MG PO TABS: 1.0000 | ORAL_TABLET | Freq: Every day | ORAL | Status: DC | PRN

## 2019-07-16 MED ORDER — ALLOPURINOL 100 MG PO TABS
100.0000 mg | ORAL_TABLET | Freq: Every day | ORAL | Status: DC
  Administered 2019-07-18 – 2019-07-20 (×3): 100 mg via ORAL
  Filled 2019-07-16 (×3): qty 1

## 2019-07-16 MED ORDER — ATORVASTATIN CALCIUM 40 MG PO TABS
80.0000 mg | ORAL_TABLET | Freq: Every day | ORAL | Status: DC
  Administered 2019-07-20: 80 mg via ORAL
  Filled 2019-07-16 (×4): qty 2

## 2019-07-16 MED ORDER — ACETAMINOPHEN 160 MG/5ML PO SOLN: 650.0000 mg | ORAL | Status: DC | PRN

## 2019-07-16 MED ORDER — TAMSULOSIN HCL 0.4 MG PO CAPS
0.4000 mg | ORAL_CAPSULE | Freq: Every day | ORAL | Status: DC
  Administered 2019-07-18 – 2019-07-20 (×3): 0.4 mg via ORAL
  Filled 2019-07-16 (×3): qty 1

## 2019-07-16 MED ORDER — OXYCODONE HCL 5 MG PO TABS: 2.5000 mg | ORAL_TABLET | Freq: Four times a day (QID) | ORAL | Status: DC | PRN

## 2019-07-16 NOTE — Progress Notes (Signed)
Patient returned from MRI.

## 2019-07-16 NOTE — H&P (Signed)
History and Physical  Kevin Perez [facility] HUD:[id number] DOB: 15-Apr-1945 DOA: 07/16/2019   PCP: [name], MD  Outpatient Specialists: Dr. Lorenso Courier (oncology) Patient coming from: Home At his baseline ambulates independently  Chief Complaint: Blurred vision  HPI: Kevin Perez is a 74 y.o. male with medical history significant for recently diagnosed multiple myeloma, history of vasculitis, HLD, HTN and anxiety.  Patient is a poor historian as he is a bit confused.  Most of history obtained by chart review is supplemented by son at bedside.  He stated that his dad was in his usual state of health until his brother caught him around 4:58 PM last night concerned that his father was complaining of trouble seeing as well as increased clumsiness stating he knocked down his drink at dinner twice which is unlike him.  Per chart review by ED provider symptoms reportedly started 5 days prior to this admission with an acute change in his vision patient stating he cannot "seem to focus on anything" as well as endorsing intermittent headaches that occurred 24 hours ago.  Of note, patient and son report back surgery (fusion) for pathologic fracture about 4 to 5 weeks prior.  Patient has been using a back brace as well as as needed use of oxycodone for pain control.  His son reports he tends to get more confused than taking his pain medication ED Course:   Afebrile, respiratory rate 22, hemodynamically stable, blood pressure range 94/73-115/62.  INR 1.1, UDS unremarkable SARS PCR negative.  Sodium 134, creatinine 1.7, BUN 24, calcium 11.7.  Hemoglobin 11.1.  ESR 61.  CRP 7.7.  Chest x-ray showed cardiomegaly with possible atelectasis.  MRI brain/orbit showed normal orbits but several punctate acute infarctions in the right cerebellar consistent with microembolic disease.  Neurology was consulted [facility] service was called for further management and evaluation of patient with blurred vision found  to have multiple acute infarcts in cerebellum.    Review of Systems:As mentioned in the history of present illness.Review of systems are otherwise negative Patient seen in the ED.   Past Medical History:  Diagnosis Date  . Cancer ([place])   . GERD 07/21/2006  . Gout   . HYPERLIPIDEMIA 06/17/2009  . HYPERTENSION 07/21/2006  . Raynaud's syndrome 07/21/2006  . Suicide attempt [facility])    Due to Prednisone!  Marland Kitchen VASCULITIS 05/23/2007   Past Surgical History:  Procedure Laterality Date  . HERNIA REPAIR  08/23/86   umbilical hernia  . INGUINAL HERNIA REPAIR     Bil  . IR VERTEBROPLASTY LUMBAR BX INC UNI/BIL INC/INJECT/IMAGING  05/31/2019   Allergies  Allergen Reactions  . Prednisone     Patient attributes suicide attempt to prednisone use.   Marland Kitchen Amoxicillin-Pot Clavulanate Nausea And Vomiting  . Ace Inhibitors     REACTION: cough  . Codeine Phosphate     REACTION: vomiting   Social History:  reports that he has quit smoking. His smoking use included cigarettes. He has a 1.00 pack-year smoking history. He quit smokeless tobacco use about 30 years ago.  His smokeless tobacco use included chew. He reports current alcohol use of about 2.0 standard drinks of alcohol per week. He reports that he does not use drugs. Family History  Problem Relation Age of Onset  . Other Mother        passed age 19  . Hypertension Father   . Prostate cancer Father 40       never operated, not cause of death  .  Stroke Father   . Meniere's disease Father   . Early death Paternal Grandfather   . Bladder Cancer Son       Prior to Admission medications   Medication Sig Start Date End Date Taking? Authorizing Provider  acetaminophen (TYLENOL) 500 MG tablet Take 1,000 mg by mouth every 8 (eight) hours as needed for moderate pain.   Yes [provider]  allopurinol (ZYLOPRIM) 100 MG tablet Take 1 tablet (100 mg total) by mouth daily. 02/26/19  Yes Shelva Majestic, MD  atorvastatin (LIPITOR) 40 MG tablet Take 1  tablet (40 mg total) by mouth 3 (three) times a week. 02/28/19  Yes Shelva Majestic, MD  losartan (COZAAR) 100 MG tablet Take 0.5 tablets (50 mg total) by mouth daily. 05/01/19  Yes Shelva Majestic, MD  mirtazapine (REMERON) 15 MG tablet TAKE 1 TABLET BY MOUTH EVERYDAY AT BEDTIME Patient taking differently: Take 15 mg by mouth at bedtime.  06/28/19  Yes Orland Mustard, MD  oxyCODONE (OXY IR/ROXICODONE) 5 MG immediate release tablet Take 1 tablet (5 mg total) by mouth every 6 (six) hours as needed for severe pain. 06/25/19  Yes Jaci Standard, MD  pantoprazole (PROTONIX) 40 MG tablet Take 1 tablet (40 mg total) by mouth daily. 06/06/19  Yes Orland Mustard, MD  sennosides-docusate sodium (SENOKOT-S) 8.6-50 MG tablet Take 1 tablet by mouth daily as needed for constipation.   Yes [provider]  tamsulosin (FLOMAX) 0.4 MG CAPS capsule TAKE 1 CAPSULE BY MOUTH EVERY DAY Patient taking differently: Take 0.4 mg by mouth daily.  05/17/19  Yes Shelva Majestic, MD  aspirin EC 81 MG tablet Take 81 mg by mouth daily. Patient not taking: Reported on 07/16/2019    [provider]  calcium carbonate (TUMS - DOSED IN MG ELEMENTAL CALCIUM) 500 MG chewable tablet Chew 1 tablet by mouth as needed for indigestion or heartburn. Patient not taking: Reported on 07/16/2019    [provider]  clonazePAM (KLONOPIN) 1 MG tablet TAKE 1/2 TO 1 TABLET BY MOUTH EVERY DAY AS NEEDED Patient not taking: Reported on 07/09/2019 04/30/19   Shelva Majestic, MD  indomethacin (INDOCIN) 25 MG capsule TAKE ONE CAPSULE BY MOUTH 3 TIMES A DAY AS NEEDED Patient not taking: Reported on 07/16/2019 02/26/19   Shelva Majestic, MD  lenalidomide (REVLIMID) 10 MG capsule Take 1 capsule (10 mg total) by mouth daily. Take for 14 days, then hold for 7 days. Repeat every 21 days. 07/16/19   Jaci Standard, MD  oxyCODONE-acetaminophen (PERCOCET) 7.5-325 MG tablet Take 1 tablet by mouth every 6 (six) hours as needed for severe  pain (with likely multiple myeloma). Patient not taking: Reported on 07/09/2019 06/20/19   Shelva Majestic, MD    Physical Exam: BP 126/69 (BP Location: Right Arm)   Pulse 77   Temp 98.8 F (37.1 C) (Oral)   Resp 20   Ht 5\' 6"  (1.676 m)   Wt 53.9 kg   SpO2 97%   BMI 19.18 kg/m   Constitutional elderly male Eyes: Anicteric sclera, normal conjunctiva ENMT: Oropharynx with moist mucous membranes, Cardiovascular: RRR no MRGs, with no peripheral edema Respiratory: Normal respiratory effort on room air, clear breath sounds  Abdomen: Soft, scaphoid abdomen, normal bowel sounds Skin: No rash ulcers, or lesions. Without skin tenting  Neurologic: Moving all extremities against gravity on his own, otherwise not following much of exam, easily distractible, no facial droop, no slurred speech Psychiatric:Appropriate affect, and  mood. Alert to self, year, place, context          Labs on Admission:  Basic Metabolic Panel: Recent Labs  Lab 07/16/19 1040  NA 134*  K 3.6  CL 93*  CO2 32  GLUCOSE 130*  BUN 24*  CREATININE 1.27*  CALCIUM 11.7*   Liver Function Tests: Recent Labs  Lab 07/16/19 1040  AST 19  ALT 13  ALKPHOS 128*  BILITOT 0.6  PROT 8.0  ALBUMIN 3.6   No results for input(s): LIPASE, AMYLASE in the last 168 hours. No results for input(s): AMMONIA in the last 168 hours. CBC: Recent Labs  Lab 07/16/19 1040  WBC 8.1  NEUTROABS 6.5  HGB 11.1*  HCT 32.9*  MCV 91.6  PLT 236   Cardiac Enzymes: No results for input(s): CKTOTAL, CKMB, CKMBINDEX, TROPONINI in the last 168 hours.  BNP (last 3 results) No results for input(s): BNP in the last 8760 hours.  ProBNP (last 3 results) No results for input(s): PROBNP in the last 8760 hours.  CBG: No results for input(s): GLUCAP in the last 168 hours.  Radiological Exams on Admission: DG Chest 2 View  Result Date: 07/16/2019 CLINICAL DATA:  CVA EXAM: CHEST - 2 VIEW COMPARISON:  05/01/2019 FINDINGS: Mild  cardiomegaly. Areas of scarring in the left mid lung. Scarring or atelectasis in the lung bases. No effusions or acute bony abnormality. IMPRESSION: Cardiomegaly. Areas of scarring or atelectasis as above. No acute cardiopulmonary disease. Electronically Signed   By: Rolm Baptise M.D.   On: 07/16/2019 17:45   MR Brain W and Wo Contrast  Result Date: 07/16/2019 CLINICAL DATA:  Vision loss. 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 History of multiple myeloma. EXAM: MRI HEAD AND ORBITS WITHOUT AND WITH CONTRAST TECHNIQUE: Multiplanar, multiecho pulse sequences of the brain and surrounding structures were obtained without and with intravenous contrast. Multiplanar, multiecho pulse sequences of the orbits and surrounding structures were obtained including fat saturation techniques, before and after intravenous contrast administration. CONTRAST:  48m GADAVIST GADOBUTROL 1 MMOL/ML IV SOLN COMPARISON:  Head CT 07/26/1998 FINDINGS: MRI HEAD FINDINGS Brain: Diffusion imaging shows a punctate acute infarction within the right superior cerebellum. Punctate acute infarction present in the left occipital cortex and at the left parietooccipital junction region. No large confluent infarction. Elsewhere, brain shows mild chronic small-vessel disease of hemispheric white matter. No mass, hemorrhage, hydrocephalus or extra-axial collection. After contrast administration, no abnormal enhancement occurs. Vascular: Major vessels at the base of the brain show flow. Skull and upper cervical spine: Negative Other: None MRI ORBITS FINDINGS Orbits: Both globes appear normal. Both optic nerves are normal. Orbital fat is normal. Extra-ocular muscles are normal. No abnormal contrast enhancement. Visualized sinuses: Normal Soft tissues: Normal Limited intracranial: See results of brain MRI. IMPRESSION: MRI orbits: Normal MRI head: Several punctate acute infarctions including 1 in the right cerebellum and 2 to 4 in the left  occipital and parietooccipital junction region. 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 No large confluent infarction. No mass effect or hemorrhage. Mild chronic small-vessel ischemic change elsewhere affecting the cerebral hemispheric white matter Electronically Signed   By: MNelson ChimesM.D.   On: 07/16/2019 15:00   MR ORBITS W WO CONTRAST  Result Date: 07/16/2019 CLINICAL DATA:  Vision loss. 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 History of multiple myeloma. EXAM: MRI HEAD AND ORBITS WITHOUT AND WITH CONTRAST TECHNIQUE: Multiplanar, multiecho pulse sequences of the brain and surrounding structures were  obtained without and with intravenous contrast. Multiplanar, multiecho pulse sequences of the orbits and surrounding structures were obtained including fat saturation techniques, before and after intravenous contrast administration. CONTRAST:  59m GADAVIST GADOBUTROL 1 MMOL/ML IV SOLN COMPARISON:  Head CT 07/26/1998 FINDINGS: MRI HEAD FINDINGS Brain: Diffusion imaging shows a punctate acute infarction within the right superior cerebellum. Punctate acute infarction present in the left occipital cortex and at the left parietooccipital junction region. No large confluent infarction. Elsewhere, brain shows mild chronic small-vessel disease of hemispheric white matter. No mass, hemorrhage, hydrocephalus or extra-axial collection. After contrast administration, no abnormal enhancement occurs. Vascular: Major vessels at the base of the brain show flow. Skull and upper cervical spine: Negative Other: None MRI ORBITS FINDINGS Orbits: Both globes appear normal. Both optic nerves are normal. Orbital fat is normal. Extra-ocular muscles are normal. No abnormal contrast enhancement. Visualized sinuses: Normal Soft tissues: Normal Limited intracranial: See results of brain MRI. IMPRESSION: MRI orbits: Normal MRI head: Several punctate acute infarctions including 1 in the  right cerebellum and 2 to 4 in the left occipital and parietooccipital junction region. 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 No large confluent infarction. No mass effect or hemorrhage. Mild chronic small-vessel ischemic change elsewhere affecting the cerebral hemispheric white matter Electronically Signed   By: MNelson ChimesM.D.   On: 07/16/2019 15:00     Assessment/Plan Present on Admission: . CVA (cerebral vascular accident) ([place] . Hyponatremia . BPH associated with nocturia . Essential hypertension . GERD . Gout . Hyperlipidemia . Lumbar compression fracture ([place] . CKD (chronic kidney disease), stage III  Active Problems:   Hyperlipidemia   Essential hypertension   History of arteritis   GERD   Gout   BPH associated with nocturia   Multiple myeloma without remission (H[facility])   CVA (cerebral vascular accident) ([place]   Hyponatremia   Lumbar compression fracture (H[facility])   CKD (chronic kidney disease), stage III   Blurry vision secondary to several punctate acute infarct in the right cerebellum, left occipital/parietal occipital lobe, concern for microembolic disease Unclear if seen intermittent atrial fibrillation on telemetry.  High concern for embolic event.  Seems to still be having some vision changes, able to follow commands, no acute strength deficits on exam, no slurred speech, no facial droop -Appreciate neurology recommendations, obtain MRA brain wo contrast, okay for BP management outside of permissive hypertensive window -A1c, fasting lipid panel -PT/OT/speech -TTE, carotid Dopplers -Continue aspirin, statin -Monitor on telemetry, frequent neuro checks, check EKG  Hypotension SBP's in the 90s to low 100s.  Suspect related to diminished oral intake witnessed by family.  Doubt infection given afebrile, no white count, no localizing signs or symptoms of infection -IV fluids x24 hours and reassess -Hold home BP meds -Blood  cultures if becomes febrile  Remote history of (05/2007) arteritis. ESR AND CRP elevated.  Blurry vision seems more consistent with CVA.  Doubt temporal arteritis, no jaw claudication or temporal lobe tenderness. -Continue to monitor  HTN. Blood pressure actually little soft on admission with low of SBP in the 90s, currently 115 -Currently hold off on home losartan, HCTZ  Multiple myeloma not having received remission with lytic bone lesions Newly diagnosed 06/2019.  Bone marrow biopsy 6/15  confirmed plasma cell myeloma -followed outpatient by Dr. DLorenso Courier(oncology), on Revlimid as outpatient  CKD stage IIIa, stable Creatinine stable at baseline of 1.2 -Avoid nephrotoxins -Monitor output  GERD -continue PPI  Hyperlipidemia -Increase statin to high intensity dosing  given CVA  Gout, stable -Continue allopurinol  Status post IR vertebral body augmentation for painful compression fracture at L4 using vertebroplasty technique (05/31/2019) Back pain stable currently  Last ED for worsening back pain on 07/09/2019 -Neurosurgery recommended TLSO brace for comfort on 07/09/19 given failed improvement in symptoms with kyphoplasty -Decrease oxycodone to 2.5 mg twice daily given reports of confusion with higher dose -1 g Tylenol every 8 hourly for pain control -Delirium protocol -Bowel regimen  BPH, stable -Monitor output -On Flomax  DVT prophylaxis: Heparin  Code Status: Full code, confirmed with son Merrilee Seashore) at bedside  Family Communication: Son Merrilee Seashore updated at bedside Disposition Plan: Expect greater than 2 midnight stay given acute multiple CVAs requiring stroke work-up, close monitoring of hypotension with IV fluids  Consults called: Neurology (ED provider)  Admission status: Admitted as inpatient to telemetry unit to [facility].      Desiree Hane MD [facility]  Pager [phone]  If 7PM-7AM, please contact night-coverage [email or web address] Password  [facility]  07/16/2019, 9:12 PM

## 2019-07-16 NOTE — ED Notes (Signed)
Called to give report. Nurse will return call

## 2019-07-16 NOTE — Progress Notes (Signed)
Patient received from [facility]. Alert and oriented X4. Denies any pain. Oriented to unit and procedures. Sons at bedside. Patient planned for MRI. Scheduled Ativan to be given. VSS. Skin intact. Denies any needs at this time, call bell in reach.

## 2019-07-16 NOTE — Addendum Note (Signed)
Addended by: Darl Pikes on: 07/16/2019 09:50 AM   Modules accepted: Orders

## 2019-07-16 NOTE — ED Notes (Signed)
Patient transported to MRI 

## 2019-07-16 NOTE — ED Triage Notes (Signed)
Patient complains of "fuzzy vision" since 5pm yesterday, states it is equally in both eyes. Patient ambulatory, has been more cautious walking D/T his vision. Denies any eye or head pain, denies recent falls or trauma. Grips strength, BLE strength and upper extremity strength 5/5. Unable to read numbers on the clock. Wears glasses.

## 2019-07-16 NOTE — Consult Note (Signed)
Referring Physician: Dr. Lonny Prude    Chief Complaint: Vision changes  HPI: Kevin Perez is an 74 y.o. male with multiple myeloma, HLD, HTN, severe low back pain, cataracts and vasculitis, who presented to the [facility] ED this morning with a chief complaint of binocular "fuzzy vision" since beginning 5 days ago. Son states that the patient complained of only being able to "see half of you" at one point when looking at his son. He did not have any associated weakness. He has had some associated trouble with speech. He has no headache or jaw claudication symptoms. Does not endorse myalgias. No dysphagia.   MRI brain obtained in the ED revealed several punctate acute infarctions including 1 in the right cerebellum and 2 to 4 in the left occipital and parietooccipital junction region. 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 No large confluent infarction. No mass effect or hemorrhage. Mild chronic small-vessel ischemic change elsewhere affecting the cerebral hemispheric white matter  MRI orbits was normal  LSN: 5 days prior to presentation tPA Given: No: Out of time window  Past Medical History:  Diagnosis Date  . Cancer ([place])   . GERD 07/21/2006  . Gout   . HYPERLIPIDEMIA 06/17/2009  . HYPERTENSION 07/21/2006  . Raynaud's syndrome 07/21/2006  . Suicide attempt [facility])    Due to Prednisone!  Marland Kitchen VASCULITIS 05/23/2007    Past Surgical History:  Procedure Laterality Date  . HERNIA REPAIR  06/19/20   umbilical hernia  . INGUINAL HERNIA REPAIR     Bil  . IR VERTEBROPLASTY LUMBAR BX INC UNI/BIL INC/INJECT/IMAGING  05/31/2019    Family History  Problem Relation Age of Onset  . Other Mother        passed age 35  . Hypertension Father   . Prostate cancer Father 38       never operated, not cause of death  . Stroke Father   . Meniere's disease Father   . Early death Paternal Grandfather   . Bladder Cancer Son    Social History:  reports that he has quit smoking. His  smoking use included cigarettes. He has a 1.00 pack-year smoking history. He quit smokeless tobacco use about 30 years ago.  His smokeless tobacco use included chew. He reports current alcohol use of about 2.0 standard drinks of alcohol per week. He reports that he does not use drugs.  Allergies:  Allergies  Allergen Reactions  . Prednisone     Patient attributes suicide attempt to prednisone use.   Marland Kitchen Amoxicillin-Pot Clavulanate Nausea And Vomiting  . Ace Inhibitors     REACTION: cough  . Codeine Phosphate     REACTION: vomiting    Medications:   Current Facility-Administered Medications on File Prior to Encounter  Medication Dose Route Frequency Provider Last Rate Last Admin  . vancomycin (VANCOCIN) 1,000 mg in sodium chloride 0.9 % 500 mL IVPB  1,000 mg Intravenous Once Jacqualine Mau, NP       Current Outpatient Medications on File Prior to Encounter  Medication Sig Dispense Refill  . acetaminophen (TYLENOL) 500 MG tablet Take 1,000 mg by mouth every 8 (eight) hours as needed for moderate pain.    Marland Kitchen allopurinol (ZYLOPRIM) 100 MG tablet Take 1 tablet (100 mg total) by mouth daily. 90 tablet 3  . atorvastatin (LIPITOR) 40 MG tablet Take 1 tablet (40 mg total) by mouth 3 (three) times a week. 90 tablet 3  . losartan (COZAAR) 100 MG tablet Take 0.5 tablets (  50 mg total) by mouth daily. 45 tablet 3  . mirtazapine (REMERON) 15 MG tablet TAKE 1 TABLET BY MOUTH EVERYDAY AT BEDTIME (Patient taking differently: Take 15 mg by mouth at bedtime. ) 90 tablet 1  . oxyCODONE (OXY IR/ROXICODONE) 5 MG immediate release tablet Take 1 tablet (5 mg total) by mouth every 6 (six) hours as needed for severe pain. 56 tablet 0  . pantoprazole (PROTONIX) 40 MG tablet Take 1 tablet (40 mg total) by mouth daily. 30 tablet 3  . sennosides-docusate sodium (SENOKOT-S) 8.6-50 MG tablet Take 1 tablet by mouth daily as needed for constipation.    . tamsulosin (FLOMAX) 0.4 MG CAPS capsule TAKE 1 CAPSULE BY MOUTH  EVERY DAY (Patient taking differently: Take 0.4 mg by mouth daily. ) 90 capsule 1  . aspirin EC 81 MG tablet Take 81 mg by mouth daily. (Patient not taking: Reported on 07/16/2019)    . calcium carbonate (TUMS - DOSED IN MG ELEMENTAL CALCIUM) 500 MG chewable tablet Chew 1 tablet by mouth as needed for indigestion or heartburn. (Patient not taking: Reported on 07/16/2019)    . clonazePAM (KLONOPIN) 1 MG tablet TAKE 1/2 TO 1 TABLET BY MOUTH EVERY DAY AS NEEDED (Patient not taking: Reported on 07/09/2019) 15 tablet 1  . indomethacin (INDOCIN) 25 MG capsule TAKE ONE CAPSULE BY MOUTH 3 TIMES A DAY AS NEEDED (Patient not taking: Reported on 07/16/2019) 60 capsule 1  . lenalidomide (REVLIMID) 10 MG capsule Take 1 capsule (10 mg total) by mouth daily. Take for 14 days, then hold for 7 days. Repeat every 21 days. 14 capsule 0  . oxyCODONE-acetaminophen (PERCOCET) 7.5-325 MG tablet Take 1 tablet by mouth every 6 (six) hours as needed for severe pain (with likely multiple myeloma). (Patient not taking: Reported on 07/09/2019) 20 tablet 0    Prior to Admission:  Scheduled: .  stroke: mapping our early stages of recovery book   Does not apply Once  . allopurinol  100 mg Oral Daily  . aspirin  325 mg Oral Daily  . atorvastatin  80 mg Oral Daily  . heparin  5,000 Units Subcutaneous Q8H  . mirtazapine  15 mg Oral QHS  . pantoprazole  40 mg Oral Daily  . tamsulosin  0.4 mg Oral Daily    ROS: As per HPI. Comprehensive ROS otherwise negative.   Physical Examination: Blood pressure (!) 106/51, pulse 79, temperature 98.6 F (37 C), temperature source Oral, resp. rate 17, height '5\' 10"'$  (1.778 m), weight 55 kg, SpO2 100 %.  HEENT: [place]/AT Lungs: Respirations unlabored Ext: No edema  Neurologic Examination: Mental Status: Alert, oriented to city, state, year and day of week, but not to month. Speech is fluent. Intact comprehension for basic commands, but has difficulty with more complex questions and commands. Was  able to repeat a phrase. Missed one word, substituting a phonemic paraphasic error for "thumb" when asked to name 3 objects. Cranial Nerves: II:  Visual fields with no consistent field cut; he does give different answers on some separate trials. PERRL III,IV, VI: EOMI with saccadic pursuits noted. No ptosis.  V,VII: Smile symmetric, facial temp sensation equal bilaterally VIII: hearing intact to voice IX,X: No hypophonia XI: Symmetric shoulder shrug XII: Tongue protrudes slightly to the right.  Motor: No pronator drift.  5/5 BUE and BLE proximally and distally Sensory: Temp and light touch intact throughout, bilaterally. No extinction to DSS.  Deep Tendon Reflexes:  2+ bilateral biceps, brachioradialis and patellae Plantars: Right: downgoing  Left: downgoing Cerebellar: No ataxia on the right. Has mild dysmetria with left FNF.  Gait: Deferred  Results for orders placed or performed during the hospital encounter of 07/16/19 (from the past 48 hour(s))  Comprehensive metabolic panel     Status: Abnormal   Collection Time: 07/16/19 10:40 AM  Result Value Ref Range   Sodium 134 (L) 135 - 145 mmol/L   Potassium 3.6 3.5 - 5.1 mmol/L   Chloride 93 (L) 98 - 111 mmol/L   CO2 32 22 - 32 mmol/L   Glucose, Bld 130 (H) 70 - 99 mg/dL    Comment: Glucose reference range applies only to samples taken after fasting for at least 8 hours.   BUN 24 (H) 8 - 23 mg/dL   Creatinine, Ser 1.27 (H) 0.61 - 1.24 mg/dL   Calcium 11.7 (H) 8.9 - 10.3 mg/dL   Total Protein 8.0 6.5 - 8.1 g/dL   Albumin 3.6 3.5 - 5.0 g/dL   AST 19 15 - 41 U/L   ALT 13 0 - 44 U/L   Alkaline Phosphatase 128 (H) 38 - 126 U/L   Total Bilirubin 0.6 0.3 - 1.2 mg/dL   GFR calc non Af Amer 55 (L) >60 mL/min   GFR calc Af Amer >60 >60 mL/min   Anion gap 9 5 - 15    Comment: Performed at [facility], [place] [address], [place], [place] [address]  CBC with Differential     Status: Abnormal   Collection Time: 07/16/19  10:40 AM  Result Value Ref Range   WBC 8.1 4.0 - 10.5 K/uL   RBC 3.59 (L) 4.22 - 5.81 MIL/uL   Hemoglobin 11.1 (L) 13.0 - 17.0 g/dL   HCT 32.9 (L) 39 - 52 %   MCV 91.6 80.0 - 100.0 fL   MCH 30.9 26.0 - 34.0 pg   MCHC 33.7 30.0 - 36.0 g/dL   RDW 12.6 11.5 - 15.5 %   Platelets 236 150 - 400 K/uL   nRBC 0.0 0.0 - 0.2 %   Neutrophils Relative % 80 %   Neutro Abs 6.5 1.7 - 7.7 K/uL   Lymphocytes Relative 12 %   Lymphs Abs 0.9 0.7 - 4.0 K/uL   Monocytes Relative 8 %   Monocytes Absolute 0.6 0 - 1 K/uL   Eosinophils Relative 0 %   Eosinophils Absolute 0.0 0 - 0 K/uL   Basophils Relative 0 %   Basophils Absolute 0.0 0 - 0 K/uL   Immature Granulocytes 0 %   Abs Immature Granulocytes 0.02 0.00 - 0.07 K/uL    Comment: Performed at [facility], [place] [address] [place], [place] [address]  Urinalysis, Routine w reflex microscopic     Status: Abnormal   Collection Time: 07/16/19 10:40 AM  Result Value Ref Range   Color, Urine YELLOW YELLOW   APPearance CLEAR CLEAR   Specific Gravity, Urine 1.011 1.005 - 1.030   pH 6.0 5.0 - 8.0   Glucose, UA NEGATIVE NEGATIVE mg/dL   Hgb urine dipstick NEGATIVE NEGATIVE   Bilirubin Urine NEGATIVE NEGATIVE   Ketones, ur NEGATIVE NEGATIVE mg/dL   Protein, ur NEGATIVE NEGATIVE mg/dL   Nitrite NEGATIVE NEGATIVE   Leukocytes,Ua LARGE (A) NEGATIVE   RBC / HPF 0-5 0 - 5 RBC/hpf   WBC, UA 21-50 0 - 5 WBC/hpf   Bacteria, UA RARE (A) NONE SEEN    Comment: Performed at [facility], [place] [address] [place], [place] [address]  Sedimentation rate     Status: Abnormal   Collection Time: 07/16/19 10:40 AM  Result Value Ref Range   Sed Rate 61 (H) 0 - 16 mm/hr    Comment: Performed at [facility], [place] [address] [place], [place] [address]  C-reactive protein     Status: Abnormal   Collection Time: 07/16/19 10:40 AM  Result Value Ref Range   CRP 7.7 (H) <1.0 mg/dL    Comment: Performed at [facility], [place] [address], [place], [place] [address]  SARS Coronavirus 2 by RT PCR (hospital order, performed in [facility] hospital lab) Nasopharyngeal Nasopharyngeal Swab     Status: None   Collection Time: 07/16/19  3:16 PM   Specimen: Nasopharyngeal Swab  Result Value Ref Range   SARS Coronavirus 2 NEGATIVE NEGATIVE    Comment: (NOTE) SARS-CoV-2 target nucleic acids are NOT DETECTED.  The SARS-CoV-2 RNA is generally detectable in upper and lower respiratory specimens during the acute phase of infection. The lowest concentration of SARS-CoV-2 viral copies this assay can detect is 250 copies / mL. A negative result does not preclude SARS-CoV-2 infection and should not be used as the sole basis for treatment or other patient management decisions.  A negative result may occur with improper specimen collection / handling, submission of specimen other than nasopharyngeal swab, presence of viral mutation(s) within the areas targeted by this assay, and inadequate number of viral copies (<250 copies / mL). A negative result must be combined with clinical observations, patient history, and epidemiological information.  Fact Sheet for Patients:   StrictlyIdeas.no  Fact Sheet for Healthcare Providers: BankingDealers.co.za  This test is not yet approved or  cleared by the [place] FDA and has been authorized for detection and/or diagnosis of SARS-CoV-2 by FDA under an Emergency Use Authorization (EUA).  This EUA will remain in effect (meaning this test can be used) for the duration of the COVID-19 declaration under Section 564(b)(1) of the Act, 21 U.S.C. section 360bbb-3(b)(1), unless the authorization is terminated or revoked sooner.  Performed at [facility], [place] [address], [place], [place] [address]   Protime-INR     Status: None   Collection Time: 07/16/19  5:01 PM  Result Value Ref Range    Prothrombin Time 13.9 11.4 - 15.2 seconds   INR 1.1 0.8 - 1.2    Comment: (NOTE) INR goal varies based on device and disease states. Performed at [facility], [place] [address], [place], [place] [address]   APTT     Status: None   Collection Time: 07/16/19  5:01 PM  Result Value Ref Range   aPTT 29 24 - 36 seconds    Comment: Performed at [facility], [place] [address] [place],  [address]  Urine rapid drug screen (hosp performed)not at [facility]     Status: None   Collection Time: 07/16/19  5:01 PM  Result Value Ref Range   Opiates NONE DETECTED NONE DETECTED   Cocaine NONE DETECTED NONE DETECTED   Benzodiazepines NONE DETECTED NONE DETECTED   Amphetamines NONE DETECTED NONE DETECTED   Tetrahydrocannabinol NONE DETECTED NONE DETECTED   Barbiturates NONE DETECTED NONE DETECTED    Comment: (NOTE) DRUG SCREEN FOR MEDICAL PURPOSES ONLY.  IF CONFIRMATION IS NEEDED FOR ANY PURPOSE, NOTIFY LAB WITHIN 5 DAYS.  LOWEST DETECTABLE LIMITS FOR URINE DRUG SCREEN Drug Class  Cutoff (ng/mL) Amphetamine and metabolites    1000 Barbiturate and metabolites    200 Benzodiazepine                 858 Tricyclics and metabolites     300 Opiates and metabolites        300 Cocaine and metabolites        300 THC                            50 Performed at [facility], [place] [address] [place], [place] [address]    DG Chest 2 View  Result Date: 07/16/2019 CLINICAL DATA:  CVA EXAM: CHEST - 2 VIEW COMPARISON:  05/01/2019 FINDINGS: Mild cardiomegaly. Areas of scarring in the left mid lung. Scarring or atelectasis in the lung bases. No effusions or acute bony abnormality. IMPRESSION: Cardiomegaly. Areas of scarring or atelectasis as above. No acute cardiopulmonary disease. Electronically Signed   By: Rolm Baptise M.D.   On: 07/16/2019 17:45   MR Brain W and Wo Contrast  Result Date: 07/16/2019 CLINICAL DATA:  Vision loss.  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 History of multiple myeloma. EXAM: MRI HEAD AND ORBITS WITHOUT AND WITH CONTRAST TECHNIQUE: Multiplanar, multiecho pulse sequences of the brain and surrounding structures were obtained without and with intravenous contrast. Multiplanar, multiecho pulse sequences of the orbits and surrounding structures were obtained including fat saturation techniques, before and after intravenous contrast administration. CONTRAST:  43m GADAVIST GADOBUTROL 1 MMOL/ML IV SOLN COMPARISON:  Head CT 07/26/1998 FINDINGS: MRI HEAD FINDINGS Brain: Diffusion imaging shows a punctate acute infarction within the right superior cerebellum. Punctate acute infarction present in the left occipital cortex and at the left parietooccipital junction region. No large confluent infarction. Elsewhere, brain shows mild chronic small-vessel disease of hemispheric white matter. No mass, hemorrhage, hydrocephalus or extra-axial collection. After contrast administration, no abnormal enhancement occurs. Vascular: Major vessels at the base of the brain show flow. Skull and upper cervical spine: Negative Other: None MRI ORBITS FINDINGS Orbits: Both globes appear normal. Both optic nerves are normal. Orbital fat is normal. Extra-ocular muscles are normal. No abnormal contrast enhancement. Visualized sinuses: Normal Soft tissues: Normal Limited intracranial: See results of brain MRI. IMPRESSION: MRI orbits: Normal MRI head: Several punctate acute infarctions including 1 in the right cerebellum and 2 to 4 in the left occipital and parietooccipital junction region. 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 No large confluent infarction. No mass effect or hemorrhage. Mild chronic small-vessel ischemic change elsewhere affecting the cerebral hemispheric white matter Electronically Signed   By: MNelson ChimesM.D.   On: 07/16/2019 15:00   MR ORBITS W WO CONTRAST  Result Date:  07/16/2019 CLINICAL DATA:  Vision loss. 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 History of multiple myeloma. EXAM: MRI HEAD AND ORBITS WITHOUT AND WITH CONTRAST TECHNIQUE: Multiplanar, multiecho pulse sequences of the brain and surrounding structures were obtained without and with intravenous contrast. Multiplanar, multiecho pulse sequences of the orbits and surrounding structures were obtained including fat saturation techniques, before and after intravenous contrast administration. CONTRAST:  529mGADAVIST GADOBUTROL 1 MMOL/ML IV SOLN COMPARISON:  Head CT 07/26/1998 FINDINGS: MRI HEAD FINDINGS Brain: Diffusion imaging shows a punctate acute infarction within the right superior cerebellum. Punctate acute infarction present in the left occipital cortex and at the left parietooccipital junction region. No large confluent infarction. Elsewhere, brain shows mild chronic small-vessel  disease of hemispheric white matter. No mass, hemorrhage, hydrocephalus or extra-axial collection. After contrast administration, no abnormal enhancement occurs. Vascular: Major vessels at the base of the brain show flow. Skull and upper cervical spine: Negative Other: None MRI ORBITS FINDINGS Orbits: Both globes appear normal. Both optic nerves are normal. Orbital fat is normal. Extra-ocular muscles are normal. No abnormal contrast enhancement. Visualized sinuses: Normal Soft tissues: Normal Limited intracranial: See results of brain MRI. IMPRESSION: MRI orbits: Normal MRI head: Several punctate acute infarctions including 1 in the right cerebellum and 2 to 4 in the left occipital and parietooccipital junction region. 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 No large confluent infarction. No mass effect or hemorrhage. Mild chronic small-vessel ischemic change elsewhere affecting the cerebral hemispheric white matter Electronically Signed   By: Nelson Chimes M.D.   On: 07/16/2019 15:00     Assessment: 74 y.o. male presenting with visual blurring and speech deficit 1. Exam reveals some mild language deficit and mild LUE dysmetria 2. MRI brain reveals several punctate acute infarctions including 1 in the right cerebellum and 2 to 4 in the left occipital and parietooccipital junction region. 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  3. MRI orbits negative.  4. Stroke Risk Factors - Cancer history, HLD, HTN and history of vasculitis NOS  Recommendations: 1. HgbA1c, fasting lipid panel 2. MRA  of the brain without contrast 3. PT consult, OT consult, Speech consult 4. TTE 5. Carotid dopplers 6. Prophylactic therapy-Continue ASA and statin. Pending results of workup, may need to be switched to Plavix or an anticoagulant. 7. Risk factor modification 8. Telemetry monitoring 9. Frequent neuro checks 10. BP management. Out of permissive HTN time window.    '@Electronically'$  signed: Dr. Kerney Elbe  07/16/2019, 7:59 PM

## 2019-07-16 NOTE — ED Notes (Signed)
RN to return call for report

## 2019-07-16 NOTE — ED Provider Notes (Signed)
[facility] DEPT Provider Note   CSN: [id number] Arrival date & time: 07/16/19  8144     History Chief Complaint  Patient presents with  . Blurred Vision    Kevin Perez is a 74 y.o. male with a past medical history of recently diagnosed multiple myeloma, chronic back pain, history of vasculitis, hyperlipidemia, hypertension, and anxiety.  The patient is a poor historian which limits HPI. he is attended by his son.  Apparently 5 days ago the patient seems to have had an acute change in his vision.  He states "I just cannot seem to focus on anything."  He wears glasses and has a history of cataracts.  He denies previous surgeries.  EMR states that he has a history of glaucoma but patient states that he does not have this and does not take any eyedrops for management.  He is having a lot of difficulty specifying exactly what is wrong.  He does endorse having a few intermittent headaches yesterday.  He denies jaw claudication symptoms, unilateral weakness, difficulty with speech or swallowing.  He is unsure if he is seeing double.  He was unable to attend a recent ophthalmologic visit due to his severe back pain.  HPI     Past Medical History:  Diagnosis Date  . Cancer ([place])   . GERD 07/21/2006  . Gout   . HYPERLIPIDEMIA 06/17/2009  . HYPERTENSION 07/21/2006  . Raynaud's syndrome 07/21/2006  . Suicide attempt [facility])    Due to Prednisone!  Marland Kitchen VASCULITIS 05/23/2007    Patient Active Problem List   Diagnosis Date Noted  . Multiple myeloma not having achieved remission () 07/03/2019  . Goals of care, counseling/discussion 06/25/2019  . Coronary atherosclerosis 06/20/2019  . Aortic atherosclerosis ([place]) 06/20/2019  . Protein-calorie malnutrition ([place]) 05/01/2019  . Former smoker 04/30/2014  . Testicular swelling, left 04/30/2014  . BPH associated with nocturia 04/30/2014  . Prepatellar bursitis of right knee 01/22/2014  . Gout 01/08/2014  . Insomnia  10/29/2013  . Senile cataracts of both eyes 08/22/2013  . 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 Macular degeneration 02/03/2011  . Hyperlipidemia 06/17/2009  . History of arteritis 05/23/2007  . History of suicide attempt- while on high dose steroids. avoid prednisone.  01/19/2007  . Essential hypertension 07/21/2006  . GERD 07/21/2006    Past Surgical History:  Procedure Laterality Date  . HERNIA REPAIR  08/19/83   umbilical hernia  . INGUINAL HERNIA REPAIR     Bil  . IR VERTEBROPLASTY LUMBAR BX INC UNI/BIL INC/INJECT/IMAGING  05/31/2019       Family History  Problem Relation Age of Onset  . Other Mother        passed age 42  . Hypertension Father   . Prostate cancer Father 60       never operated, not cause of death  . Stroke Father   . Meniere's disease Father   . Early death Paternal Grandfather   . Bladder Cancer Son     Social History   Tobacco Use  . Smoking status: Former Smoker    Packs/day: 0.05    Years: 20.00    Pack years: 1.00    Types: Cigarettes  . Smokeless tobacco: Former Systems developer    Types: [place] date: 06/26/1989  . Tobacco comment: chewed tobacco until 89 more than he smoked   Vaping Use  . Vaping Use: Never used  Substance Use  Topics  . Alcohol use: Yes    Alcohol/week: 2.0 standard drinks    Types: 2 Standard drinks or equivalent per week    Comment: with meals on occasion  . Drug use: No    Home Medications Prior to Admission medications   Medication Sig Start Date End Date Taking? Authorizing Provider  acetaminophen (TYLENOL) 500 MG tablet Take 1,000 mg by mouth every 8 (eight) hours as needed for moderate pain.    [provider]  allopurinol (ZYLOPRIM) 100 MG tablet Take 1 tablet (100 mg total) by mouth daily. 02/26/19   [name], MD  aspirin EC 81 MG tablet Take 81 mg by mouth daily.    [provider]  atorvastatin (LIPITOR) 40 MG tablet Take 1 tablet  (40 mg total) by mouth 3 (three) times a week. 02/28/19   [name], MD  calcium carbonate (TUMS - DOSED IN MG ELEMENTAL CALCIUM) 500 MG chewable tablet Chew 1 tablet by mouth as needed for indigestion or heartburn.    [provider]  clonazePAM (KLONOPIN) 1 MG tablet TAKE 1/2 TO 1 TABLET BY MOUTH EVERY DAY AS NEEDED Patient not taking: Reported on 07/09/2019 04/30/19   [name], MD  indomethacin (INDOCIN) 25 MG capsule TAKE ONE CAPSULE BY MOUTH 3 TIMES A DAY AS NEEDED Patient taking differently: Take 25 mg by mouth 3 (three) times daily as needed for mild pain (gout). TAKE ONE CAPSULE BY MOUTH 3 TIMES A DAY AS NEEDED 02/26/19   [name], MD  lenalidomide (REVLIMID) 10 MG capsule Take 1 capsule (10 mg total) by mouth daily. Take for 14 days, then hold for 7 days. Repeat every 21 days. 07/16/19   Orson Slick, MD  losartan (COZAAR) 100 MG tablet Take 0.5 tablets (50 mg total) by mouth daily. 05/01/19   [name], MD  mirtazapine (REMERON) 15 MG tablet TAKE 1 TABLET BY MOUTH EVERYDAY AT BEDTIME Patient taking differently: Take 15 mg by mouth at bedtime.  06/28/19   Orma Flaming, MD  oxyCODONE (OXY IR/ROXICODONE) 5 MG immediate release tablet Take 1 tablet (5 mg total) by mouth every 6 (six) hours as needed for severe pain. 06/25/19   Orson Slick, MD  oxyCODONE-acetaminophen (PERCOCET) 7.5-325 MG tablet Take 1 tablet by mouth every 6 (six) hours as needed for severe pain (with likely multiple myeloma). Patient not taking: Reported on 07/09/2019 06/20/19   [name], MD  pantoprazole (PROTONIX) 40 MG tablet Take 1 tablet (40 mg total) by mouth daily. 06/06/19   Orma Flaming, MD  sennosides-docusate sodium (SENOKOT-S) 8.6-50 MG tablet Take 1 tablet by mouth daily as needed for constipation.    [provider]  tamsulosin (FLOMAX) 0.4 MG CAPS capsule TAKE 1 CAPSULE BY MOUTH EVERY DAY Patient taking differently: Take 0.4 mg by mouth daily.   05/17/19   [name], MD    Allergies    Prednisone, Amoxicillin-pot clavulanate, Ace inhibitors, and Codeine phosphate  Review of Systems   Review of Systems Ten systems reviewed and are negative for acute change, except as noted in the HPI.   Physical Exam Updated Vital Signs BP (!) 141/65 (BP Location: Right Arm)   Pulse 88   Temp 98.6 F (37 C) (Oral)   Ht '5\' 10"'$  (1.778 m)   Wt 55 kg   SpO2 99%   BMI 17.40 kg/m   Physical Exam Vitals and nursing note reviewed.  Constitutional:  General: He is not in acute distress.    Appearance: He is well-developed and underweight. He is not diaphoretic.  HENT:     Head: Normocephalic and atraumatic.  Eyes:     General: Gaze aligned appropriately. Visual field deficit present. No scleral icterus.    Extraocular Movements:     Right eye: Normal extraocular motion and no nystagmus.     Left eye: Normal extraocular motion.     Conjunctiva/sclera: Conjunctivae normal.     Right eye: Right conjunctiva is not injected. No chemosis, exudate or hemorrhage.    Left eye: Left conjunctiva is not injected. No chemosis, exudate or hemorrhage.    Pupils: Pupils are equal, round, and reactive to light.     Visual Fields: Left eye visual fields normal.     Comments: Doesn't seem to have vision in the R eye  Cardiovascular:     Rate and Rhythm: Normal rate and regular rhythm.     Heart sounds: Normal heart sounds.  Pulmonary:     Effort: Pulmonary effort is normal. No respiratory distress.     Breath sounds: Normal breath sounds.  Abdominal:     Palpations: Abdomen is soft.     Tenderness: There is no abdominal tenderness.  Musculoskeletal:     Cervical back: Normal range of motion and neck supple.  Skin:    General: Skin is warm and dry.  Neurological:     Mental Status: He is alert. Mental status is at baseline. He is confused.     GCS: GCS eye subscore is 4. GCS verbal subscore is 5. GCS motor subscore is 6.     Motor: Motor  function is intact.     Coordination: Finger-Nose-Finger Test abnormal.  Psychiatric:        Behavior: Behavior normal. Behavior is cooperative.     ED Results / Procedures / Treatments   Labs (all labs ordered are listed, but only abnormal results are displayed) Labs Reviewed - No data to display  EKG None  Radiology No results found.  Procedures .Critical Care Performed by: Arthor Captain, PA-C Authorized by: Arthor Captain, PA-C   Critical care provider statement:    Critical care time (minutes):  45   Critical care time was exclusive of:  Separately billable procedures and treating other patients   Critical care was necessary to treat or prevent imminent or life-threatening deterioration of the following conditions:  CNS failure or compromise   Critical care was time spent personally by me on the following activities:  Discussions with consultants, evaluation of patient's response to treatment, examination of patient, ordering and performing treatments and interventions, ordering and review of laboratory studies, ordering and review of radiographic studies, pulse oximetry, re-evaluation of patient's condition, obtaining history from patient or surrogate and review of old charts   (including critical care time)  Medications Ordered in ED Medications - No data to display  ED Course  I have reviewed the triage vital signs and the nursing notes.  Pertinent labs & imaging results that were available during my care of the patient were reviewed by me and considered in my medical decision making (see chart for details).    MDM Rules/Calculators/A&P                          CC: vision changes VS: BP 109/62   Pulse 81   Temp 98.6 F (37 C) (Oral)   Resp (!) 21   Ht 5'  10" (1.778 m)   Wt 55 kg   SpO2 100%   BMI 17.40 kg/m  NI:DPOEUMP is gathered by patient  and EMR, son at bedside. Previous records obtained and reviewed. DDX:The patient's complaint of visual change  involves an extensive number of diagnostic and treatment options, and is a complaint that carries with it a high risk of complications, morbidity, and potential mortality. Given the large differential diagnosis, medical decision making is of high complexity.  Retinal detachment, dirty glasses, need for new glasses prescription, acute angle-closure glaucoma less likely given patient has no eye pain.  Stroke, retinal artery occlusion.  Labs: I ordered reviewed and interpreted labs which include CMP which shows mild hyponatremia, mild renal insufficiency, sed rate and CRP are elevated although I doubt temporal arteritis.  Urine shows large amount of blood, rare bacteria, no urinary symptoms.  And I would not treat this asymptomatic bacteriuria.  CBC without elevated white blood cell count. Imaging: I ordered and reviewed images which included MRI brain and orbits. I independently visualized and interpreted all imaging. Significant findings include multiple punctate acute infarcts in the bilateral cerebellar region consistent with embolic stroke. EKG: Consults: Dr. Cheral Marker of neurology I reviewed labs and imaging of the patient who will be admitted MDM: Patient here with visual changes.  Difficult physical examination concerning for complete vision loss versus peripheral field loss.  Patient has acute stroke findings.  He will be admitted to hospitalist service.     Final Clinical Impression(s) / ED Diagnoses Final diagnoses:  None    Rx / DC Orders ED Discharge Orders    None       Margarita Mail, PA-C 07/16/19 1811    Dorie Rank, MD 07/17/19 [phone]

## 2019-07-16 NOTE — Telephone Encounter (Addendum)
Oral Chemotherapy Pharmacist Encounter  Due to insurance restriction the medication could not be filled at [facility]. Prescription has been e-scribed to [facility]  Supportive information was faxed to [facility]. We will continue to follow medication access.   Called and notified Gerald Stabs, provided with new pharmacy number.  Darl Pikes, PharmD, BCPS, [facility] Hematology/Oncology Clinical Pharmacist [facility]/HP/AP [facility] [phone]  07/16/2019 9:49 AM

## 2019-07-16 NOTE — ED Notes (Signed)
[facility] called. 

## 2019-07-17 ENCOUNTER — Inpatient Hospital Stay (HOSPITAL_COMMUNITY): Payer: Medicare Other

## 2019-07-17 DIAGNOSIS — I6340 Cerebral infarction due to embolism of unspecified cerebral artery: Secondary | ICD-10-CM

## 2019-07-17 DIAGNOSIS — I5032 Chronic diastolic (congestive) heart failure: Secondary | ICD-10-CM

## 2019-07-17 DIAGNOSIS — I6389 Other cerebral infarction: Secondary | ICD-10-CM

## 2019-07-17 LAB — BASIC METABOLIC PANEL
Anion gap: 7 (ref 5–15)
BUN: 19 mg/dL (ref 8–23)
CO2: 30 mmol/L (ref 22–32)
Calcium: 11.6 mg/dL — ABNORMAL HIGH (ref 8.9–10.3)
Chloride: 99 mmol/L (ref 98–111)
Creatinine, Ser: 1.16 mg/dL (ref 0.61–1.24)
GFR calc Af Amer: >60 mL/min (ref >60)
GFR calc non Af Amer: >60 mL/min (ref >60)
Glucose, Bld: 101 mg/dL — ABNORMAL HIGH (ref 70–99)
Potassium: 3.2 mmol/L — ABNORMAL LOW (ref 3.5–5.1)
Sodium: 136 mmol/L (ref 135–145)

## 2019-07-17 LAB — CBC
HCT: 29.9 % — ABNORMAL LOW (ref 39.0–52.0)
Hemoglobin: 9.8 g/dL — ABNORMAL LOW (ref 13.0–17.0)
MCH: 30 pg (ref 26.0–34.0)
MCHC: 32.8 g/dL (ref 30.0–36.0)
MCV: 91.4 fL (ref 80.0–100.0)
Platelets: 210 10*3/uL (ref 150–400)
RBC: 3.27 MIL/uL — ABNORMAL LOW (ref 4.22–5.81)
RDW: 12.6 % (ref 11.5–15.5)
WBC: 6.9 10*3/uL (ref 4.0–10.5)
nRBC: 0 % (ref 0.0–0.2)

## 2019-07-17 LAB — ECHOCARDIOGRAM COMPLETE
Height: 66 in
Weight: 1901.25 oz

## 2019-07-17 MED ORDER — ENSURE ENLIVE PO LIQD
237.0000 mL | Freq: Three times a day (TID) | ORAL | Status: DC
  Administered 2019-07-17 – 2019-07-20 (×9): 237 mL via ORAL

## 2019-07-17 MED ORDER — ASPIRIN EC 81 MG PO TBEC
81.0000 mg | DELAYED_RELEASE_TABLET | Freq: Every day | ORAL | Status: DC
  Administered 2019-07-18 – 2019-07-20 (×3): 81 mg via ORAL
  Filled 2019-07-17 (×3): qty 1

## 2019-07-17 MED ORDER — ADULT MULTIVITAMIN W/MINERALS CH
1.0000 | ORAL_TABLET | Freq: Every day | ORAL | Status: DC
  Administered 2019-07-18 – 2019-07-20 (×3): 1 via ORAL
  Filled 2019-07-17 (×3): qty 1

## 2019-07-17 MED ORDER — HALOPERIDOL LACTATE 5 MG/ML IJ SOLN
2.0000 mg | Freq: Four times a day (QID) | INTRAMUSCULAR | Status: DC | PRN
  Administered 2019-07-17: 2 mg via INTRAVENOUS
  Filled 2019-07-17: qty 1

## 2019-07-17 MED ORDER — CLOPIDOGREL BISULFATE 75 MG PO TABS
75.0000 mg | ORAL_TABLET | Freq: Every day | ORAL | Status: DC
  Administered 2019-07-18 – 2019-07-20 (×3): 75 mg via ORAL
  Filled 2019-07-17 (×3): qty 1

## 2019-07-17 NOTE — Evaluation (Signed)
Speech Language Pathology Evaluation Patient Details Name: Kevin Perez MRN: [id number] DOB: 01-20-1945 Today's Date: 07/17/2019 Time: 2706-2376 SLP Time Calculation (min) (ACUTE ONLY): 40 min  Problem List:  Patient Active Problem List   Diagnosis Date Noted  . CVA (cerebral vascular accident) ([place]) 07/16/2019  . Hyponatremia 07/16/2019  . Lumbar compression fracture ([place]) 07/16/2019  . CKD (chronic kidney disease), stage III 07/16/2019  . Multiple myeloma without remission ([place]) 07/03/2019  . Goals of care, counseling/discussion 06/25/2019  . Coronary atherosclerosis 06/20/2019  . Aortic atherosclerosis ([place]) 06/20/2019  . Protein-calorie malnutrition ([place]) 05/01/2019  . Former smoker 04/30/2014  . Testicular swelling, left 04/30/2014  . BPH associated with nocturia 04/30/2014  . Prepatellar bursitis of right knee 01/22/2014  . Gout 01/08/2014  . Insomnia 10/29/2013  . Senile cataracts of both eyes 08/22/2013  . 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 Macular degeneration 02/03/2011  . Hyperlipidemia 06/17/2009  . History of arteritis 05/23/2007  . History of suicide attempt- while on high dose steroids. avoid prednisone.  01/19/2007  . Essential hypertension 07/21/2006  . GERD 07/21/2006   Past Medical History:  Past Medical History:  Diagnosis Date  . Cancer ([place])   . GERD 07/21/2006  . Gout   . HYPERLIPIDEMIA 06/17/2009  . HYPERTENSION 07/21/2006  . Raynaud's syndrome 07/21/2006  . Suicide attempt [facility])    Due to Prednisone!  Marland Kitchen VASCULITIS 05/23/2007   Past Surgical History:  Past Surgical History:  Procedure Laterality Date  . HERNIA REPAIR  02/25/29   umbilical hernia  . INGUINAL HERNIA REPAIR     Bil  . IR VERTEBROPLASTY LUMBAR BX INC UNI/BIL INC/INJECT/IMAGING  05/31/2019   HPI:  Kevin Perez is an 74 y.o. male with multiple myeloma, HLD, HTN, severe low back pain, cataracts and vasculitis, who presented to  the [facility] ED this morning with a chief complaint of binocular "fuzzy vision" since beginning 5 days ago. He did not have any associated weakness. He has had some associated trouble with speech.  Does not endorse myalgias. No dysphagia.  MRI brain obtained in the ED revealed several punctate acute infarctions including 1 in theright cerebellum and 2 to 4 in the left occipital and parietooccipital junction region. 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 No large confluent infarction. No mass effect or hemorrhage. Mild chronic small-vessel ischemic change elsewhere affecting the cerebral hemispheric white matter   Assessment / Plan / Recommendation Clinical Impression  Pt presents with mild cognitive deficits in setting of recent CVA.  Family reports that some areas of impairment reflect baseline functioning.  Pt's son manages medication and has been assisting with finance management since his back surgery.  Pt was assessed using COGNISTAT (see below for additional information).  Memory was area of largest deficit.  Family notes that he can't remember what happened today, but has excellent recall for 1954.  With word finding task, pt required cueing to name 3 of 8 items, but family believes these are not high frequency words for him and have not noted difficulty with word finding in conversation.  Calculation deficits noted, but pt reports using calculator and may have been able to perform better with additional time. Of note, during picture description task for language sample, language was fluent and grammatical; however, content was completely unrelated to image.  Pt admitted with visual disturbances, but he was able to recognize most other objects and pictures.  It is unclear how  much visual changes are impacting his performance.  SLP to follow while pt is admitted for cognitive therapy. Consider reassessment if visual deficits resolve.   COGNISTAT - All subtests are within  the average range, except where otherwise specified Orientation: 10/12 Attention: 7/8 Comprehension: 5/6 Repetition: 12/12 Naming: 5/8, Mild Impairment Construction: not assessed Memory: 4/12, Severe Impairment Calculations: 1/4, Moderate impairment Similarities: 4/8, Mild Impairment Reasoning: 6/6     SLP Assessment  SLP Recommendation/Assessment: Patient needs continued Speech Lanaguage Pathology Services SLP Visit Diagnosis: Cognitive communication deficit (R41.841)    Follow Up Recommendations   (TBD; continue ST at next level of care at present)    Frequency and Duration min 2x/week  2 weeks      SLP Evaluation Cognition  Overall Cognitive Status: Impaired/Different from baseline Orientation Level: Oriented X4 Attention: Sustained;Focused Focused Attention: Appears intact Sustained Attention: Appears intact Memory: Impaired Memory Impairment: Storage deficit;Retrieval deficit;Decreased short term Hydrologist Function: Reasoning Reasoning: Appears intact       Comprehension  Auditory Comprehension Overall Auditory Comprehension: Appears within functional limits for tasks assessed Commands: Within Functional Limits Conversation: Simple    Expression Expression Primary Mode of Expression: Verbal Verbal Expression Overall Verbal Expression: Appears within functional limits for tasks assessed Naming: Impairment Responsive: 51-75% accurate Written Expression Dominant Hand: Right   Oral / Motor  Motor Speech Overall Motor Speech: Appears within functional limits for tasks assessed Respiration: Within functional limits Resonance: Hypernasality Articulation: Within functional limitis Intelligibility: Intelligible Motor Planning: Witnin functional limits Motor Speech Errors: Not applicable   [place], [place], [place] Office: [phone]  07/17/2019, 12:39 PM

## 2019-07-17 NOTE — Progress Notes (Signed)
  Echocardiogram 2D Echocardiogram has been performed.  Kevin Perez M 07/17/2019, 9:53 AM

## 2019-07-17 NOTE — Progress Notes (Signed)
Patient is restless. Attempts to get OOB. Believes he is home, reorientation unsuccessful. Mittens placed on patient due to him removing monitoring equipment. This nurse has sat in room to help redirect patient. Unsuccessful. Patient states he is going to "call the sheriff". MD made aware via secure text. IV haldol ordered. Will monitor effectiveness.

## 2019-07-17 NOTE — Progress Notes (Signed)
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  . atorvastatin  80 mg Oral q1800  . clopidogrel  75 mg Oral Daily  . feeding supplement (ENSURE ENLIVE)  237 mL Oral TID BM  . heparin  5,000 Units Subcutaneous Q8H  . mirtazapine  15 mg Oral QHS  . [START ON 07/18/2019] multivitamin with minerals  1 tablet Oral Daily  . pantoprazole  40 mg Oral Daily  . tamsulosin  0.4 mg Oral Daily   Continuous Infusions:   LOS: 1 day    Time spent:40 min    Cheron Coryell, Geraldo Docker, MD [facility] Pager [phone]  If 7PM-7AM, please contact night-coverage [email or web address] Password [facility] 07/17/2019, 8:47 PM

## 2019-07-17 NOTE — Progress Notes (Signed)
Initial Nutrition Assessment  DOCUMENTATION CODES:   Severe malnutrition in context of chronic illness  INTERVENTION:   - Recommend liberalizing diet to Regular  - Ensure Enlive po TID, each supplement provides 350 kcal and 20 grams of protein  - Provided "High Calorie, High Protein Nutrition Therapy" handout and education to pt's son  - MVI with minerals daily  NUTRITION DIAGNOSIS:   Severe Malnutrition related to chronic illness (multiple myeloma) as evidenced by severe muscle depletion, severe fat depletion, percent weight loss (16.2% weight loss in less than 7 months).  GOAL:   Patient will meet greater than or equal to 90% of their needs  MONITOR:   PO intake, Supplement acceptance, Labs, Weight trends  REASON FOR ASSESSMENT:   Malnutrition Screening Tool    ASSESSMENT:   74 year old male who presented to the ED on 6/28 with blurred vision. PMH of recently diagnosed multiple myeloma, chronic back pain, HLD, HTN, anxiety, vasculitis.   Spoke with pt and son at bedside. Pt's son concerned that pt has not had anything to eat since 9:00 am yesterday. Discussed NPO diet order with MD who is going to evaluate pt and order the appropriate diet order. Noted pt's diet was later advanced to [facility].  Pt's son reports that pt was diagnosed with multiple myeloma in March 2021 and that he began eating less at that time due to decreased appetite. Pt's son also shares that pt lost his wife in January 2020 which is when pt's weight loss began because he "started eating for one." They report pt's UW as 165-170 lbs and that he last weighed this in January 2020. Pt now weighs about 120 lbs per pt's son.  Pt typically eats 3 meals daily and is very active, walking 4-5 miles daily prior to multiple myeloma diagnosis. Pt's son reports that pt's portion sizes have gotten smaller since the diagnosis in March. Noted pt is on Remeron. Pt typically drinks 3-4 Ensures daily at home. RD will  order Ensure Enlive TID during admission. Pt and pt's son aware. Pt prefers the vanilla flavor.  Reviewed weight history available in chart. Pt with a 10.4 kg weight loss since 02/02/19. This is a 16.2% weight loss in less than 7 months which is severe and significant for timeframe. Pt meets criteria for malnutrition.  Discussed ways to increase kcal and protein in pt's diet with pt and his son. Provided handouts from the [employer].  Medications reviewed and include: Remeron 15 mg daily, protonix IVF: NS @ 75 ml/hr  Labs reviewed: potassium 3.2, hemoglobin 9.8  NUTRITION - FOCUSED PHYSICAL EXAM:    Most Recent Value  Orbital Region Severe depletion  Upper Arm Region Severe depletion  Thoracic and Lumbar Region Severe depletion  Buccal Region Severe depletion  Temple Region Severe depletion  Clavicle Bone Region Severe depletion  Clavicle and Acromion Bone Region Severe depletion  Scapular Bone Region Severe depletion  Dorsal Hand Severe depletion  Patellar Region Moderate depletion  Anterior Thigh Region Moderate depletion  Posterior Calf Region Moderate depletion  Edema (RD Assessment) None  Hair Reviewed  Eyes Reviewed  Mouth Reviewed  Skin Reviewed  Nails Reviewed       Diet Order:   Diet Order            Diet Heart Room service appropriate? Yes with Assist; Fluid consistency: Thin  Diet effective now                 EDUCATION  NEEDS:   Education needs have been addressed  Skin:  Skin Assessment: Reviewed RN Assessment  Last BM:  no documented BM  Height:   Ht Readings from Last 1 Encounters:  07/16/19 '5\' 6"'$  (1.676 m)    Weight:   Wt Readings from Last 1 Encounters:  07/16/19 53.9 kg    Ideal Body Weight:  64.5 kg  BMI:  Body mass index is 19.18 kg/m.  Estimated Nutritional Needs:   Kcal:  1600-1800  Protein:  75-90 grams  Fluid:  1.6-1.8 L    Gaynell Face, MS, RD, LDN Inpatient Clinical  Dietitian Pager: [phone] Weekend/After Hours: [phone]

## 2019-07-17 NOTE — Progress Notes (Signed)
Carotid duplex       has been completed. Preliminary results can be found under CV proc through chart review. Lucianne Smestad, BS, RDMS, RVT   

## 2019-07-17 NOTE — Evaluation (Signed)
Occupational Therapy Evaluation Patient Details Name: Kevin Perez MRN: [id number] DOB: 1945/10/25 Today's Date: 07/17/2019    History of Present Illness Pt is a 74 y.o. male with PMH of multiple myeloma, HLD, HTN, severe low back pain, cataracts and vasculitis, who presented to the [facility] ED this morning with a chief complaint of binocular "fuzzy vision" since 07/11/2019. S/p back fusion 4-5 weeks ago. 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   Clinical Impression   PTA pt reports living home alone ambulating with no DME. Pt had son that lived close by that would check on him before his back surgery. Pt was admitted for above and treated for problem list below. Pt is A&Ox2 disoriented to time and situation. Pt impulsive and restless trying to stand up when told to sit down. Very distractible and decreased safety awareness. Decreased awareness of back precautions (not wearing brace or following precautions) and situation. Requires Min-Mod A with verbal cues for ADLs due to distractibility and safety. Requires Min-Mod A with transfers and ambulation with multimodal cues for safety. Pt reports vision change, but hard to assess due to cognition - will further assess next session. Son present for eval and open to SNF but would prefer father come home due to cognition. Believe pt would benefit from skilled OT services acutely and at the SNF level (hopeful to progression of HH service to increase participation in ADLs and return to PLOF.    Follow Up Recommendations  SNF;Supervision/Assistance - 24 hour (hopeful for progession to [facility] services )    Equipment Recommendations  None recommended by OT       Precautions / Restrictions Precautions Precautions: Fall;Back Restrictions Weight Bearing Restrictions: No      Mobility Bed Mobility Overal bed mobility: Needs Assistance Bed Mobility:  Supine to Sit     Supine to sit: Min assist     General bed mobility comments: Pt not adhering to back precautions - flexing trunk and walking legs to EOB instead of log roll. Needed R shoulder support when sitting EOB and min A to scoot EOB  Transfers Overall transfer level: Needs assistance Equipment used: 2 person hand held assist Transfers: Sit to/from Stand Sit to Stand: Min assist;Mod assist;+2 physical assistance;+2 safety/equipment         General transfer comment: Min A with physical assist with Mod cues for attention to task.     Balance Overall balance assessment: Needs assistance Sitting-balance support: Feet supported;No upper extremity supported Sitting balance-Leahy Scale: Poor Sitting balance - Comments: needed support on R shoulder   Standing balance support: During functional activity Standing balance-Leahy Scale: Poor Standing balance comment: requiring 2 HHA                           ADL either performed or assessed with clinical judgement   ADL Overall ADL's : Needs assistance/impaired Eating/Feeding: NPO   Grooming: Moderate assistance;Cueing for safety;Cueing for sequencing;Standing   Upper Body Bathing: Minimal assistance;Cueing for safety;Cueing for sequencing;Sitting   Lower Body Bathing: Moderate assistance;Sitting/lateral leans   Upper Body Dressing : Minimal assistance;Sitting   Lower Body Dressing: Sit to/from stand;Maximal assistance;Cueing for back precautions;Cueing for safety;Cueing for sequencing   Toilet Transfer: Moderate assistance;Ambulation;Regular Toilet   Toileting- Clothing Manipulation and Hygiene: Minimal assistance;Sitting/lateral lean;Sit to/from stand;Adhering to back precautions;Cueing for safety;Cueing for sequencing  Functional mobility during ADLs: Moderate assistance;Minimal assistance;+2 for physical assistance;+2 for safety/equipment (2 HHA) General ADL Comments: Pt impulsive and non-compliant  with back precautions. Needs extra assist for cognitive deficits and safety     Vision Baseline Vision/History: Wears glasses Wears Glasses: At all times Patient Visual Report: Peripheral vision impairment;Blurring of vision Vision Assessment?: Yes Eye Alignment: Within Functional Limits Ocular Range of Motion: Within Functional Limits Alignment/Gaze Preference: Within Defined Limits Tracking/Visual Pursuits: Requires cues, head turns, or add eye shifts to track Visual Fields: Impaired-to be further tested in functional context Additional Comments: Difficult to assess due to cognition will need to be further assessed.              Pertinent Vitals/Pain Pain Assessment: Faces Faces Pain Scale: Hurts even more Pain Location: back Pain Descriptors / Indicators: Guarding;Grimacing;Discomfort Pain Intervention(s): Monitored during session;Repositioned     Hand Dominance Right   Extremity/Trunk Assessment Upper Extremity Assessment Upper Extremity Assessment: Generalized weakness   Lower Extremity Assessment Lower Extremity Assessment: Defer to PT evaluation   Cervical / Trunk Assessment Cervical / Trunk Assessment: Kyphotic   Communication Communication Communication: Expressive difficulties   Cognition Arousal/Alertness: Awake/alert Behavior During Therapy: Restless;Impulsive Overall Cognitive Status: Impaired/Different from baseline Area of Impairment: Orientation;Attention;Memory;Following commands;Safety/judgement;Awareness;Problem solving                 Orientation Level: Situation;Time Current Attention Level: Focused Memory: Decreased recall of precautions;Decreased short-term memory Following Commands: Follows one step commands inconsistently Safety/Judgement: Decreased awareness of safety;Decreased awareness of deficits Awareness: Intellectual Problem Solving: Difficulty sequencing;Requires verbal cues;Requires tactile cues General Comments: Pt  impulsive and restless trying to stand up when told to sit down. Very distractible and decreased safety awareness. Required extra cues to keep pt on task. Decreased awareness of back precautions (not wearing brace or following precautions) and situation.    General Comments  Pt's son in room and able to confirm PLOF and home set up. Son reports that he is open to SNF or rehab but would like for his father to return home to help with cognition. Would like to work towards [facility].            Home Living Family/patient expects to be discharged to:: Private residence Living Arrangements: Alone Available Help at Discharge: Family Type of Home: House Home Access: Stairs to enter Technical brewer of Steps: 3 Entrance Stairs-Rails: Left;Right Home Layout: One level     Bathroom Shower/Tub: Tub/shower unit;Walk-in shower   Bathroom Toilet: Handicapped height     Home Equipment: Shower seat - built in;Walker - 2 wheels          Prior Functioning/Environment Level of Independence: Needs assistance                 OT Problem List: Decreased strength;Decreased activity tolerance;Impaired balance (sitting and/or standing);Impaired vision/perception;Decreased coordination;Decreased cognition;Decreased safety awareness;Decreased knowledge of use of DME or AE;Decreased knowledge of precautions;Pain      OT Treatment/Interventions: Self-care/ADL training;Therapeutic exercise;Energy conservation;DME and/or AE instruction;Therapeutic activities;Cognitive remediation/compensation;Visual/perceptual remediation/compensation;Patient/family education;Balance training    OT Goals(Current goals can be found in the care plan section) Acute Rehab OT Goals Patient Stated Goal: go home OT Goal Formulation: With patient/family Time For Goal Achievement: 07/31/19 Potential to Achieve Goals: Good  OT Frequency: Min 2X/week           Co-evaluation PT/OT/SLP Co-Evaluation/Treatment: Yes Reason  for Co-Treatment: Complexity of the patient's impairments (multi-system involvement);Necessary to address cognition/behavior during functional activity;For patient/therapist safety;To address functional/ADL transfers  OT goals addressed during session: ADL's and self-care;Strengthening/ROM      AM-PAC OT "6 Clicks" Daily Activity     Outcome Measure Help from another person eating meals?:  (NPO) Help from another person taking care of personal grooming?: A Little Help from another person toileting, which includes using toliet, bedpan, or urinal?: A Little Help from another person bathing (including washing, rinsing, drying)?: A Lot Help from another person to put on and taking off regular upper body clothing?: A Little Help from another person to put on and taking off regular lower body clothing?: A Little 6 Click Score: 14   End of Session Nurse Communication: Mobility status  Activity Tolerance: Patient limited by pain Patient left: in chair;with call bell/phone within reach;with chair alarm set;with family/visitor present (son)  OT Visit Diagnosis: Unsteadiness on feet (R26.81);Muscle weakness (generalized) (M62.81);Other symptoms and signs involving the nervous system (R29.898);Other symptoms and signs involving cognitive function;Pain;Low vision, both eyes (H54.2) Pain - part of body:  (back)                Time: 5146-0479 OT Time Calculation (min): 39 min Charges:  OT General Charges $OT Visit: 1 Visit OT Evaluation $OT Eval Moderate Complexity: 1 Mod OT Treatments $Self Care/Home Management : 8-22 mins  Shawntavia Saunders/OTS  Janeal Abadi 07/17/2019, 12:19 PM

## 2019-07-17 NOTE — Evaluation (Signed)
Physical Therapy Evaluation Patient Details Name: Kevin Perez MRN: [id number] DOB: 1945/03/20 Today's Date: 07/17/2019   History of Present Illness  Pt is a 74 y.o. male with PMH of multiple myeloma, HLD, HTN, severe low back pain, cataracts and vasculitis, who presented to the [facility] ED this morning with a chief complaint of binocular "fuzzy vision" since 07/11/2019. S/p back fusion 4-5 weeks ago. 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  Clinical Impression  Pt admitted with/for fuzzy vision with MRI revealing stroke.  Pt needing min to moderate assist for basic mobility and gait.Marland Kitchen  Pt currently limited functionally due to the problems listed. ( See problems list.)   Pt will benefit from PT to maximize function and safety in order to get ready for next venue listed below.     Follow Up Recommendations SNF;Supervision/Assistance - 24 hour;Other (comment) (will work toward [facility] per son's wishes for pt.)    Equipment Recommendations  Other (comment) (TBA, has rollator)    Recommendations for Other Services       Precautions / Restrictions Precautions Precautions: Fall;Back      Mobility  Bed Mobility Overal bed mobility: Needs Assistance Bed Mobility: Supine to Sit     Supine to sit: Min assist     General bed mobility comments: Pt not adhering to back precautions - flexing trunk and walking legs to EOB instead of log roll. Needed R shoulder support when sitting EOB and min A to scoot EOB  Transfers Overall transfer level: Needs assistance Equipment used: 2 person hand held assist Transfers: Sit to/from Stand Sit to Stand: Min assist;Mod assist;+2 physical assistance;+2 safety/equipment         General transfer comment: Min A with physical assist with Mod cues for attention to task.   Ambulation/Gait Ambulation/Gait assistance: Min assist;+2 physical  assistance (mod for attentional cues/assist with fatigue) Gait Distance (Feet): 40 Feet Assistive device: 2 person hand held assist;IV Pole Gait Pattern/deviations: Step-through pattern     General Gait Details: mildly retropulsive and unsteady, needing at least external support from 1 person  Stairs            Wheelchair Mobility    Modified Rankin (Stroke Patients Only) Modified Rankin (Stroke Patients Only) Pre-Morbid Rankin Score: No symptoms Modified Rankin: Moderately severe disability     Balance Overall balance assessment: Needs assistance Sitting-balance support: Feet supported;No upper extremity supported Sitting balance-Leahy Scale: Poor Sitting balance - Comments: needed support on R shoulder   Standing balance support: During functional activity Standing balance-Leahy Scale: Poor Standing balance comment: requiring 2 HHA                             Pertinent Vitals/Pain Pain Assessment: Faces Faces Pain Scale: Hurts even more Pain Location: back Pain Descriptors / Indicators: Guarding;Grimacing;Discomfort Pain Intervention(s): Monitored during session    Home Living Family/patient expects to be discharged to:: Private residence Living Arrangements: Alone Available Help at Discharge: Family Type of Home: House Home Access: Stairs to enter Entrance Stairs-Rails: Chemical engineer of Steps: 3 Home Layout: One level Home Equipment: Shower seat - built in;Walker - 2 wheels      Prior Function Level of Independence: Needs assistance               Hand Dominance   Dominant Hand: Right    Extremity/Trunk Assessment  Upper Extremity Assessment Upper Extremity Assessment: Defer to OT evaluation    Lower Extremity Assessment Lower Extremity Assessment: Generalized weakness    Cervical / Trunk Assessment Cervical / Trunk Assessment: Kyphotic  Communication   Communication: Expressive difficulties  Cognition  Arousal/Alertness: Awake/alert Behavior During Therapy: Restless;Impulsive Overall Cognitive Status: Impaired/Different from baseline Area of Impairment: Orientation;Attention;Memory;Following commands;Safety/judgement;Awareness;Problem solving                 Orientation Level: Situation;Time Current Attention Level: Focused Memory: Decreased recall of precautions;Decreased short-term memory Following Commands: Follows one step commands inconsistently Safety/Judgement: Decreased awareness of safety;Decreased awareness of deficits Awareness: Intellectual Problem Solving: Difficulty sequencing;Requires verbal cues;Requires tactile cues General Comments: Pt impulsive and restless trying to stand up when told to sit down. Very distractible and decreased safety awareness. Required extra cues to keep pt on task. Decreased awareness of back precautions (not wearing brace or following precautions) and situation.       General Comments General comments (skin integrity, edema, etc.): Pt's son in room and able to confirm PLOF and home set up. Son reports that he is open to SNF or rehab but would like for his father to return home to help with cognition. Would like to work towards [facility].    Exercises     Assessment/Plan    PT Assessment Patient needs continued PT services  PT Problem List Decreased strength;Decreased activity tolerance;Decreased balance;Decreased mobility;Decreased coordination;Decreased cognition;Decreased safety awareness       PT Treatment Interventions      PT Goals (Current goals can be found in the Care Plan section)  Acute Rehab PT Goals Patient Stated Goal: go home PT Goal Formulation: With patient/family Potential to Achieve Goals: Fair    Frequency Min 3X/week   Barriers to discharge        Co-evaluation PT/OT/SLP Co-Evaluation/Treatment: Yes Reason for Co-Treatment: Complexity of the patient's impairments (multi-system involvement) PT goals addressed  during session: Mobility/safety with mobility         AM-PAC PT "6 Clicks" Mobility  Outcome Measure Help needed turning from your back to your side while in a flat bed without using bedrails?: A Little Help needed moving from lying on your back to sitting on the side of a flat bed without using bedrails?: A Little Help needed moving to and from a bed to a chair (including a wheelchair)?: A Lot Help needed standing up from a chair using your arms (e.g., wheelchair or bedside chair)?: A Lot Help needed to walk in hospital room?: A Lot Help needed climbing 3-5 steps with a railing? : A Lot 6 Click Score: 14    End of Session   Activity Tolerance: Patient tolerated treatment well Patient left: in chair;with call bell/phone within reach;with chair alarm set;with family/visitor present Nurse Communication: Mobility status PT Visit Diagnosis: Unsteadiness on feet (R26.81);Other symptoms and signs involving the nervous system (R29.898);Other abnormalities of gait and mobility (R26.89)    Time: 7482-7078 PT Time Calculation (min) (ACUTE ONLY): 39 min   Charges:   PT Evaluation $PT Eval Moderate Complexity: 1 Mod          07/17/2019  Ginger Carne., PT Acute Rehabilitation Services [phone]  (pager) [phone]  (office)  Tessie Fass Julio Zappia 07/17/2019, 5:30 PM
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
## 2019-07-18 ENCOUNTER — Ambulatory Visit: Payer: Medicare Other | Admitting: Hematology and Oncology

## 2019-07-18 ENCOUNTER — Other Ambulatory Visit: Payer: Medicare Other

## 2019-07-18 DIAGNOSIS — I63113 Cerebral infarction due to embolism of bilateral vertebral arteries: Secondary | ICD-10-CM

## 2019-07-18 LAB — COMPREHENSIVE METABOLIC PANEL
ALT: 13 U/L (ref 0–44)
AST: 18 U/L (ref 15–41)
Albumin: 3 g/dL — ABNORMAL LOW (ref 3.5–5.0)
Alkaline Phosphatase: 110 U/L (ref 38–126)
Anion gap: 9 (ref 5–15)
BUN: 18 mg/dL (ref 8–23)
CO2: 29 mmol/L (ref 22–32)
Calcium: 11.6 mg/dL — ABNORMAL HIGH (ref 8.9–10.3)
Chloride: 99 mmol/L (ref 98–111)
Creatinine, Ser: 1.03 mg/dL (ref 0.61–1.24)
GFR calc Af Amer: >60 mL/min (ref >60)
GFR calc non Af Amer: >60 mL/min (ref >60)
Glucose, Bld: 108 mg/dL — ABNORMAL HIGH (ref 70–99)
Potassium: 3.1 mmol/L — ABNORMAL LOW (ref 3.5–5.1)
Sodium: 137 mmol/L (ref 135–145)
Total Bilirubin: 0.3 mg/dL (ref 0.3–1.2)
Total Protein: 7.2 g/dL (ref 6.5–8.1)

## 2019-07-18 LAB — LIPID PANEL
Cholesterol: 118 mg/dL (ref 0–200)
HDL: 30 mg/dL — ABNORMAL LOW (ref >40)
LDL Cholesterol: 67 mg/dL (ref 0–99)
Total CHOL/HDL Ratio: 3.9 RATIO
Triglycerides: 103 mg/dL (ref <150)
VLDL: 21 mg/dL (ref 0–40)

## 2019-07-18 LAB — PHOSPHORUS: Phosphorus: 3.1 mg/dL (ref 2.5–4.6)

## 2019-07-18 LAB — CBC WITH DIFFERENTIAL/PLATELET
Abs Immature Granulocytes: 0.02 10*3/uL (ref 0.00–0.07)
Basophils Absolute: 0 10*3/uL (ref 0.0–0.1)
Basophils Relative: 1 %
Eosinophils Absolute: 0.2 10*3/uL (ref 0.0–0.5)
Eosinophils Relative: 3 %
HCT: 34.7 % — ABNORMAL LOW (ref 39.0–52.0)
Hemoglobin: 11.3 g/dL — ABNORMAL LOW (ref 13.0–17.0)
Immature Granulocytes: 0 %
Lymphocytes Relative: 28 %
Lymphs Abs: 1.6 10*3/uL (ref 0.7–4.0)
MCH: 29.7 pg (ref 26.0–34.0)
MCHC: 32.6 g/dL (ref 30.0–36.0)
MCV: 91.3 fL (ref 80.0–100.0)
Monocytes Absolute: 0.5 10*3/uL (ref 0.1–1.0)
Monocytes Relative: 9 %
Neutro Abs: 3.4 10*3/uL (ref 1.7–7.7)
Neutrophils Relative %: 59 %
Platelets: 246 10*3/uL (ref 150–400)
RBC: 3.8 MIL/uL — ABNORMAL LOW (ref 4.22–5.81)
RDW: 12.6 % (ref 11.5–15.5)
WBC: 5.7 10*3/uL (ref 4.0–10.5)
nRBC: 0 % (ref 0.0–0.2)

## 2019-07-18 LAB — URINE CULTURE: Culture: <10000 — AB

## 2019-07-18 LAB — MAGNESIUM: Magnesium: 1.7 mg/dL (ref 1.7–2.4)

## 2019-07-18 MED ORDER — POLYETHYLENE GLYCOL 3350 17 GM/SCOOP PO POWD
1.0000 | Freq: Once | ORAL | Status: DC
  Filled 2019-07-18: qty 255

## 2019-07-18 MED ORDER — POTASSIUM CHLORIDE CRYS ER 20 MEQ PO TBCR
40.0000 meq | EXTENDED_RELEASE_TABLET | Freq: Once | ORAL | Status: AC
  Administered 2019-07-18: 40 meq via ORAL
  Filled 2019-07-18: qty 2

## 2019-07-18 MED ORDER — BISACODYL 5 MG PO TBEC
5.0000 mg | DELAYED_RELEASE_TABLET | Freq: Once | ORAL | Status: AC
  Administered 2019-07-18: 5 mg via ORAL
  Filled 2019-07-18: qty 1
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
## 2019-07-18 NOTE — Progress Notes (Signed)
PROGRESS NOTE    Kevin Perez [facility]  TGP:[id number] DOB: 1945-11-29 DOA: 07/16/2019 PCP: [name], MD    Brief Narrative:  74 y.o. male with medical history significant for recently diagnosed multiple myeloma, history of vasculitis, HLD, HTN and anxiety.  Patient is a poor historian as he is a bit confused.  Most of history obtained by chart review is supplemented by son at bedside.  He stated that his dad was in his usual state of health until his brother caught him around 4:58 PM last night concerned that his father was complaining of trouble seeing as well as increased clumsiness stating he knocked down his drink at dinner twice which is unlike him.  Per chart review by ED provider symptoms reportedly started 5 days prior to this admission with an acute change in his vision patient stating he cannot "seem to focus on anything" as well as endorsing intermittent headaches that occurred 24 hours ago.  Of note, patient and son report back surgery (fusion) for pathologic fracture about 4 to 5 weeks prior.  Patient has been using a back brace as well as as needed use of oxycodone for pain control.  His son reports he tends to get more confused than taking his pain medication ED Course:   Afebrile, respiratory rate 22, hemodynamically stable, blood pressure range 94/73-115/62.  INR 1.1, UDS unremarkable SARS PCR negative.  Sodium 134, creatinine 1.7, BUN 24, calcium 11.7.  Hemoglobin 11.1.  ESR 61.  CRP 7.7.  Chest x-ray showed cardiomegaly with possible atelectasis.  MRI brain/orbit showed normal orbits but several punctate acute infarctions in the right cerebellar consistent with microembolic disease.  Neurology was consulted [facility] was called for further management and evaluation of patient with blurred vision found to have multiple acute infarcts in cerebellum.  Assessment & Plan:   Active Problems:   Hyperlipidemia   Essential hypertension   History of  arteritis   GERD   Gout   BPH associated with nocturia   Multiple myeloma without remission (HCC)   CVA (cerebral vascular accident) ([place])   Hyponatremia   Lumbar compression fracture (HCC)   CKD (chronic kidney disease), stage III   Chronic diastolic CHF (congestive heart failure) ([place])   Blurry vision secondary to several punctate acute infarct in the right cerebellum, left occipital/parietal occipital lobe, concern for microembolic disease -Neurology following, recommend 30 day event monitoring -cont DAPT x 3 weeks then plavix alone afterwards -SNF pending -Family reports some improvement over the past 24hrs  Hypotension SBP's in the 90s to low 100s on presentation.  Suspect related to diminished oral intake witnessed by family. -BP currently stable and improved  Remote history of (05/2007) arteritis. ESR AND CRP elevated.  Blurry vision seems more consistent with CVA.  Doubt temporal arteritis, no jaw claudication or temporal lobe tenderness. -Continue to monitor. Discussed with Neurology. Given concerns of intolerance to steroids in the past, would hold off for now and recommend close outpatient follow up  HTN. Blood pressure actually little soft on admission with low of SBP in the 90s, currently 115 -Currently hold off on home losartan, HCTZ  Multiple myeloma not having received remission with lytic bone lesions Newly diagnosed 06/2019.  Bone marrow biopsy 6/15  confirmed plasma cell myeloma -followed outpatient by Dr. Lorenso Courier (oncology), on Revlimid as outpatient -Seems to be stable at this time.   CKD stage IIIa, stable Creatinine stable at baseline of 1.2 -Avoid nephrotoxins -Cr today 1.03, stable and voiding well  GERD -continue PPI  Hyperlipidemia -Increase statin to high intensity dosing given CVA  Gout, stable -Continue allopurinol  Status post IR vertebral body augmentation for painful compression fracture at L4 using vertebroplasty technique  (05/31/2019) Back pain stable currently  Last ED for worsening back pain on 07/09/2019 -Neurosurgery recommended TLSO brace for comfort on 07/09/19 given failed improvement in symptoms with kyphoplasty -Currently on oxycodone to 2.5 mg q6hPRN -Delirium protocol -Bowel regimen  BPH, stable -Monitor output. Condom cath at present -On Flomax  Constipation -No significant BM since 6/27 noted -Will give trial of miralax plus dulcolax stimulant -Continue cathartics until good result  DVT prophylaxis: Heparin subq Code Status: Full Family Communication: Pt in room, family at bedside  Status is: Inpatient  Remains inpatient appropriate because:Unsafe d/c plan   Dispo: The patient is from: Home              Anticipated d/c is to: SNF              Anticipated d/c date is: 1 day              Patient currently is medically stable to d/c. Just awaiting SNF placement   Consultants:   Neurology  Procedures:     Antimicrobials: Anti-infectives (From admission, onward)   None       Subjective: Reports no significant BM in several days  Objective: Vitals:   07/18/19 0700 07/18/19 0844 07/18/19 1612 07/18/19 1613  BP:  137/76  (!) 149/76  Pulse: 67 84    Resp: 10 14    Temp:  97.9 F (36.6 C) 98.2 F (36.8 C)   TempSrc:  Oral  Oral  SpO2: 95% 97%    Weight:      Height:        Intake/Output Summary (Last 24 hours) at 07/18/2019 1824 Last data filed at 07/18/2019 1211 Gross per 24 hour  Intake 480 ml  Output 900 ml  Net -420 ml   Filed Weights   07/16/19 0944 07/16/19 2109 07/18/19 0500  Weight: 55 kg 53.9 kg 55.4 kg    Examination:  General exam: Appears calm and comfortable  Respiratory system: Clear to auscultation. Respiratory effort normal. Cardiovascular system: S1 & S2 heard, Regular Gastrointestinal system: Abdomen is nondistended, soft and nontender. No organomegaly or masses felt. Normal bowel sounds heard. Central nervous system: Alert and  oriented. No focal neurological deficits. Extremities: Symmetric 5 x 5 power. Skin: No rashes, lesions Psychiatry: Judgement and insight appear normal. Mood & affect appropriate.   Data Reviewed: I have personally reviewed following labs and imaging studies  CBC: Recent Labs  Lab 07/16/19 1040 07/17/19 0319 07/18/19 0409  WBC 8.1 6.9 5.7  NEUTROABS 6.5  --  3.4  HGB 11.1* 9.8* 11.3*  HCT 32.9* 29.9* 34.7*  MCV 91.6 91.4 91.3  PLT 236 210 016   Basic Metabolic Panel: Recent Labs  Lab 07/16/19 1040 07/17/19 0319 07/18/19 0409  NA 134* 136 137  K 3.6 3.2* 3.1*  CL 93* 99 99  CO2 32 30 29  GLUCOSE 130* 101* 108*  BUN 24* 19 18  CREATININE 1.27* 1.16 1.03  CALCIUM 11.7* 11.6* 11.6*  MG  --   --  1.7  PHOS  --   --  3.1   GFR: Estimated Creatinine Clearance: 49.3 mL/min (by C-G formula based on SCr of 1.03 mg/dL). Liver Function Tests: Recent Labs  Lab 07/16/19 1040 07/18/19 0409  AST 19 18  ALT 13 13  ALKPHOS 128* 110  BILITOT 0.6 0.3  PROT 8.0 7.2  ALBUMIN 3.6 3.0*   No results for input(s): LIPASE, AMYLASE in the last 168 hours. No results for input(s): AMMONIA in the last 168 hours. Coagulation Profile: Recent Labs  Lab 07/16/19 1701  INR 1.1   Cardiac Enzymes: No results for input(s): CKTOTAL, CKMB, CKMBINDEX, TROPONINI in the last 168 hours. BNP (last 3 results) No results for input(s): PROBNP in the last 8760 hours. HbA1C: No results for input(s): HGBA1C in the last 72 hours. CBG: No results for input(s): GLUCAP in the last 168 hours. Lipid Profile: Recent Labs    07/18/19 0409  CHOL 118  HDL 30*  LDLCALC 67  TRIG 103  CHOLHDL 3.9   Thyroid Function Tests: No results for input(s): TSH, T4TOTAL, FREET4, T3FREE, THYROIDAB in the last 72 hours. Anemia Panel: No results for input(s): VITAMINB12, FOLATE, FERRITIN, TIBC, IRON, RETICCTPCT in the last 72 hours. Sepsis Labs: No results for input(s): PROCALCITON, LATICACIDVEN in the last 168  hours.  Recent Results (from the past 240 hour(s))  SARS Coronavirus 2 by RT PCR (hospital order, performed in [facility] hospital lab) Nasopharyngeal Nasopharyngeal Swab     Status: None   Collection Time: 07/16/19  3:16 PM   Specimen: Nasopharyngeal Swab  Result Value Ref Range Status   SARS Coronavirus 2 NEGATIVE NEGATIVE Final    Comment: (NOTE) SARS-CoV-2 target nucleic acids are NOT DETECTED.  The SARS-CoV-2 RNA is generally detectable in upper and lower respiratory specimens during the acute phase of infection. The lowest concentration of SARS-CoV-2 viral copies this assay can detect is 250 copies / mL. A negative result does not preclude SARS-CoV-2 infection and should not be used as the sole basis for treatment or other patient management decisions.  A negative result may occur with improper specimen collection / handling, submission of specimen other than nasopharyngeal swab, presence of viral mutation(s) within the areas targeted by this assay, and inadequate number of viral copies (<250 copies / mL). A negative result must be combined with clinical observations, patient history, and epidemiological information.  Fact Sheet for Patients:   StrictlyIdeas.no  Fact Sheet for Healthcare Providers: BankingDealers.co.za  This test is not yet approved or  cleared by the [place] FDA and has been authorized for detection and/or diagnosis of SARS-CoV-2 by FDA under an Emergency Use Authorization (EUA).  This EUA will remain in effect (meaning this test can be used) for the duration of the COVID-19 declaration under Section 564(b)(1) of the Act, 21 U.S.C. section 360bbb-3(b)(1), unless the authorization is terminated or revoked sooner.  Performed at [facility], [place] [address] [place], [place] [address]   Culture, Urine     Status: Abnormal   Collection Time: 07/16/19  5:04 PM   Specimen: Urine, Random   Result Value Ref Range Status   Specimen Description   Final    URINE, RANDOM Performed at [facility] [address] [place], [place] [address]    Special Requests   Final    NONE Performed at [facility], [place] [address], [place], [place] [address]    Culture (A)  Final    <10,000 COLONIES/mL INSIGNIFICANT GROWTH Performed at [place] [address] [place], [place] [address]    Report Status 07/18/2019 FINAL  Final     Radiology Studies: MR ANGIO HEAD WO CONTRAST  Result Date: 07/16/2019 CLINICAL DATA:  Follow-up examination for posterior circulation infarcts.  EXAM: MRA HEAD WITHOUT CONTRAST TECHNIQUE: Angiographic images of the Circle of Willis were obtained using MRA technique without intravenous contrast. COMPARISON:  Prior brain MRI from earlier the same day. FINDINGS: ANTERIOR CIRCULATION: Examination degraded by motion artifact. Visualized distal cervical segments of the internal carotid arteries are widely patent with symmetric antegrade flow. Petrous, cavernous, and supraclinoid ICAs patent without appreciable stenosis or other abnormality. A1 segments patent bilaterally. Grossly normal anterior communicating artery complex. Anterior cerebral arteries patent to their distal aspects without appreciable stenosis. No M1 stenosis or occlusion. Negative MCA bifurcations. Distal MCA branches well perfused and symmetric. POSTERIOR CIRCULATION: Vertebral arteries widely patent to the vertebrobasilar junction without stenosis. Right vertebral artery dominant. Both picas patent. Basilar widely patent to its distal aspect without stenosis. Superior cerebral arteries patent bilaterally. Both PCAs primarily supplied via the basilar and are well perfused to their distal aspects without stenosis. No intracranial aneurysm or other vascular abnormality. IMPRESSION: Negative intracranial MRA. No large vessel occlusion. No hemodynamically significant  or correctable stenosis. Electronically Signed   By: Jeannine Boga M.D.   On: 07/16/2019 22:15   ECHOCARDIOGRAM COMPLETE  Result Date: 07/17/2019    ECHOCARDIOGRAM REPORT   Patient Name:   Kevin Perez Date of Exam: 07/17/2019 Medical Rec #:  [id number]        Height:       66.0 in Accession #:    [id number]       Weight:       118.8 lb Date of Birth:  [birth year]         BSA:          1.603 m Patient Age:    64 years         BP:           126/65 mmHg Patient Gender: M                HR:           72 bpm. Exam Location:  Inpatient Procedure: 2D Echo Indications:    Stroke 434.91 / I163.9  History:        Patient has no prior history of Echocardiogram examinations.                 Risk Factors:Hypertension and Dyslipidemia. Multiple myeloma,                 Raynaud's syndrome.  Sonographer:    Darlina Sicilian RDCS Referring Phys: [id number] [facility] D NETTEY  Sonographer Comments: Patient is restless and unoriented making exam very difficult IMPRESSIONS  1. Left ventricular ejection fraction, by estimation, is 60 to 65%. The left ventricle has normal function. The left ventricle has no regional wall motion abnormalities. Left ventricular diastolic parameters are consistent with Grade I diastolic dysfunction (impaired relaxation).  2. Right ventricular systolic function is normal. The right ventricular size is normal.  3. The mitral valve is normal in structure. No evidence of mitral valve regurgitation. No evidence of mitral stenosis.  4. The aortic valve is tricuspid. Aortic valve regurgitation is not visualized. Mild to moderate aortic valve sclerosis/calcification is present, without any evidence of aortic stenosis.  5. The inferior vena cava is normal in size with greater than 50% respiratory variability, suggesting right atrial pressure of 3 mmHg. FINDINGS  Left Ventricle: Left ventricular ejection fraction, by estimation, is 60 to 65%. The left ventricle has normal function. The left ventricle has no  regional wall motion abnormalities. The left ventricular internal cavity size  was normal in size. There is  no left ventricular hypertrophy. Left ventricular diastolic parameters are consistent with Grade I diastolic dysfunction (impaired relaxation). Normal left ventricular filling pressure. Right Ventricle: The right ventricular size is normal. No increase in right ventricular wall thickness. Right ventricular systolic function is normal. Left Atrium: Left atrial size was normal in size. Right Atrium: Right atrial size was normal in size. Pericardium: There is no evidence of pericardial effusion. Mitral Valve: The mitral valve is normal in structure. Normal mobility of the mitral valve leaflets. No evidence of mitral valve regurgitation. No evidence of mitral valve stenosis. Tricuspid Valve: The tricuspid valve is normal in structure. Tricuspid valve regurgitation is not demonstrated. No evidence of tricuspid stenosis. Aortic Valve: The aortic valve is tricuspid. Aortic valve regurgitation is not visualized. Mild to moderate aortic valve sclerosis/calcification is present, without any evidence of aortic stenosis. Pulmonic Valve: The pulmonic valve was normal in structure. Pulmonic valve regurgitation is not visualized. No evidence of pulmonic stenosis. Aorta: The aortic root is normal in size and structure. Venous: The inferior vena cava is normal in size with greater than 50% respiratory variability, suggesting right atrial pressure of 3 mmHg. IAS/Shunts: No atrial level shunt detected by color flow Doppler.  LEFT VENTRICLE PLAX 2D LVIDd:         3.60 cm  Diastology LVIDs:         2.70 cm  LV e' lateral:   9.36 cm/s LV PW:         1.10 cm  LV E/e' lateral: 8.6 LV IVS:        1.00 cm  LV e' medial:    6.31 cm/s LVOT diam:     2.40 cm  LV E/e' medial:  12.8 LVOT Area:     4.52 cm  RIGHT VENTRICLE RV S prime:     11.70 cm/s TAPSE (M-mode): 1.6 cm LEFT ATRIUM           Index LA diam:      2.70 cm 1.68 cm/m LA Vol  (A4C): 26.0 ml 16.22 ml/m   AORTA Ao Root diam: 3.30 cm MITRAL VALVE MV Area (PHT): 3.77 cm     SHUNTS MV Decel Time: 201 msec     Systemic Diam: 2.40 cm MV E velocity: 80.60 cm/s MV A velocity: 101.00 cm/s MV E/A ratio:  0.80 Mihai Croitoru MD Electronically signed by Sanda Klein MD Signature Date/Time: 07/17/2019/11:09:10 AM    Final    VAS US CAROTID (at [facility] and [facility] only)  Result Date: 07/18/2019 Carotid Arterial Duplex Study Indications:   CVA. Risk Factors:  Hypertension, hyperlipidemia. Other Factors: Several punctate acute infarctions. Performing Technologist: June Leap RDMS, RVT  Examination Guidelines: A complete evaluation includes B-mode imaging, spectral Doppler, color Doppler, and power Doppler as needed of all accessible portions of each vessel. Bilateral testing is considered an integral part of a complete examination. Limited examinations for reoccurring indications may be performed as noted.  Right Carotid Findings: +----------+--------+--------+--------+------------------+--------+           PSV cm/sEDV cm/sStenosisPlaque DescriptionComments +----------+--------+--------+--------+------------------+--------+ CCA Prox  86      15                                         +----------+--------+--------+--------+------------------+--------+ CCA Distal77      15                                         +----------+--------+--------+--------+------------------+--------+  ICA Prox  73      21      1-39%   heterogenous               +----------+--------+--------+--------+------------------+--------+ ICA Distal98      24                                         +----------+--------+--------+--------+------------------+--------+ ECA       104     11                                         +----------+--------+--------+--------+------------------+--------+ +----------+--------+-------+----------------+-------------------+           PSV cm/sEDV cmsDescribe         Arm Pressure (mmHG) +----------+--------+-------+----------------+-------------------+ Subclavian119            Multiphasic, WNL                    +----------+--------+-------+----------------+-------------------+ +---------+--------+--+--------+--+---------+ VertebralPSV cm/s50EDV cm/s14Antegrade +---------+--------+--+--------+--+---------+  Left Carotid Findings: +----------+--------+--------+--------+------------------+--------+           PSV cm/sEDV cm/sStenosisPlaque DescriptionComments +----------+--------+--------+--------+------------------+--------+ CCA Prox  130     20                                         +----------+--------+--------+--------+------------------+--------+ CCA Distal114     17                                         +----------+--------+--------+--------+------------------+--------+ ICA Prox  99      25      1-39%   heterogenous               +----------+--------+--------+--------+------------------+--------+ ICA Distal89      20                                         +----------+--------+--------+--------+------------------+--------+ ECA       102     11                                         +----------+--------+--------+--------+------------------+--------+ +----------+--------+--------+----------------+-------------------+           PSV cm/sEDV cm/sDescribe        Arm Pressure (mmHG) +----------+--------+--------+----------------+-------------------+ [id number]              Multiphasic, WNL                    +----------+--------+--------+----------------+-------------------+ +---------+--------+--+--------+--+---------+ VertebralPSV cm/s48EDV cm/s12Antegrade +---------+--------+--+--------+--+---------+   Summary: Right Carotid: Velocities in the right ICA are consistent with a 1-39% stenosis. Left Carotid: Velocities in the left ICA are consistent with a 1-39% stenosis. Vertebrals:  Bilateral  vertebral arteries demonstrate antegrade flow. Subclavians: Normal flow hemodynamics were seen in bilateral subclavian              arteries. *See table(s) above for measurements and observations.  Electronically signed by Antony Contras MD on 07/18/2019  at 9:33:43 AM.    Final     Scheduled Meds: . acetaminophen  1,000 mg Oral Q8H  . allopurinol  100 mg Oral Daily  . aspirin EC  81 mg Oral Daily  . atorvastatin  80 mg Oral q1800  . clopidogrel  75 mg Oral Daily  . feeding supplement (ENSURE ENLIVE)  237 mL Oral TID BM  . heparin  5,000 Units Subcutaneous Q8H  . mirtazapine  15 mg Oral QHS  . multivitamin with minerals  1 tablet Oral Daily  . pantoprazole  40 mg Oral Daily  . tamsulosin  0.4 mg Oral Daily   Continuous Infusions:   LOS: 2 days   Marylu Lund, MD [facility] Pager On Amion  If 7PM-7AM, please contact night-coverage 07/18/2019, 6:24 PM

## 2019-07-18 NOTE — Progress Notes (Signed)
Physical Therapy Treatment Patient Details Name: Kevin Perez MRN: [id number] DOB: 1945/09/15 Today's Date: 07/18/2019    History of Present Illness Pt is a 74 y.o. male with PMH of multiple myeloma, HLD, HTN, severe low back pain, cataracts and vasculitis, who presented to the [facility] ED this morning with a chief complaint of binocular "fuzzy vision" since 07/11/2019. S/p back fusion 4-5 weeks ago. 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    PT Comments    Pt with much improved mentation, though not at baseline per family.  Initiated back education/precautions for kyphoplasty.  Modified the TLSO to fit properly.  Worked on gait stability and rolling to get OOB>    Follow Up Recommendations  SNF;Supervision/Assistance - 24 hour     Equipment Recommendations  Other (comment) (TBA next venue)    Recommendations for Other Services       Precautions / Restrictions Precautions Precautions: Fall;Back    Mobility  Bed Mobility Overal bed mobility: Needs Assistance Bed Mobility: Sidelying to Sit;Rolling Rolling: Min guard Sidelying to sit: Min assist Supine to sit: Min guard     General bed mobility comments: Trying to instruct pt in and reinforce coming up from sidelying  Transfers Overall transfer level: Needs assistance   Transfers: Sit to/from Stand Sit to Stand: Min assist;+2 safety/equipment         General transfer comment: Min A with physical assist with Mod cues for attention to task.   Ambulation/Gait Ambulation/Gait assistance: Min assist;+2 physical assistance (mod for attentional cues/assist with fatigue)   Assistive device: 2 person hand held assist;IV Pole Gait Pattern/deviations: Step-through pattern     General Gait Details: mildly retropulsive and unsteady, needing at least external support from 1 person   Stairs              Wheelchair Mobility    Modified Rankin (Stroke Patients Only) Modified Rankin (Stroke Patients Only) Pre-Morbid Rankin Score: No symptoms Modified Rankin: Moderately severe disability     Balance Overall balance assessment: Needs assistance Sitting-balance support: Feet supported;No upper extremity supported Sitting balance-Leahy Scale: Poor     Standing balance support: During functional activity Standing balance-Leahy Scale: Poor                              Cognition Arousal/Alertness: Awake/alert Behavior During Therapy: WFL for tasks assessed/performed;Impulsive Overall Cognitive Status: Impaired/Different from baseline (but much improved) Area of Impairment: Orientation;Attention;Memory;Following commands;Safety/judgement;Awareness;Problem solving                 Orientation Level: Situation Current Attention Level: Sustained Memory: Decreased short-term memory;Decreased recall of precautions Following Commands: Follows one step commands with increased time Safety/Judgement: Decreased awareness of safety;Decreased awareness of deficits Awareness: Intellectual Problem Solving: Difficulty sequencing;Requires verbal cues;Requires tactile cues;Slow processing        Exercises      General Comments General comments (skin integrity, edema, etc.): pt's sons present and participating today.  Modified pt's TLSO to fit properly.      Pertinent Vitals/Pain Pain Assessment: Faces Faces Pain Scale: Hurts even more Pain Location: back Pain Descriptors / Indicators: Guarding;Discomfort Pain Intervention(s): Monitored during session    Home Living Family/patient expects to be discharged to:: Private residence Living Arrangements: Alone Available Help at Discharge: Family Type of Home: House Home Access: Stairs to enter Entrance Stairs-Rails: Left;Right Home Layout:  One level Home Equipment: Shower seat - built in;Walker - 2 wheels      Prior  Function Level of Independence: Needs assistance          PT Goals (current goals can now be found in the care plan section) Acute Rehab PT Goals Patient Stated Goal: go home PT Goal Formulation: With patient/family Potential to Achieve Goals: Fair Progress towards PT goals: Progressing toward goals    Frequency    Min 3X/week      PT Plan Current plan remains appropriate    Co-evaluation              AM-PAC PT "6 Clicks" Mobility   Outcome Measure  Help needed turning from your back to your side while in a flat bed without using bedrails?: A Little Help needed moving from lying on your back to sitting on the side of a flat bed without using bedrails?: A Little Help needed moving to and from a bed to a chair (including a wheelchair)?: A Lot Help needed standing up from a chair using your arms (e.g., wheelchair or bedside chair)?: A Little Help needed to walk in hospital room?: A Lot Help needed climbing 3-5 steps with a railing? : A Lot 6 Click Score: 15    End of Session   Activity Tolerance: Patient tolerated treatment well Patient left: in chair;with call bell/phone within reach;with chair alarm set;with family/visitor present Nurse Communication: Mobility status PT Visit Diagnosis: Unsteadiness on feet (R26.81);Other symptoms and signs involving the nervous system (R29.898);Other abnormalities of gait and mobility (R26.89)     Time: 4210-3128 PT Time Calculation (min) (ACUTE ONLY): 46 min  Charges:  $Gait Training: 8-22 mins $Therapeutic Activity: 8-22 mins $Self Care/Home Management: 8-22                     07/18/2019  Ginger Carne., PT Acute Rehabilitation Services [phone]  (pager) [phone]  (office)   Tessie Fass Makailee Nudelman 07/18/2019, 6:50 PM

## 2019-07-18 NOTE — TOC Initial Note (Addendum)
Transition of Care [facility]) - Initial/Assessment Note    Patient Details  Name: Kevin Perez MRN: [id number] Date of Birth: 10-16-1945  Transition [facility]) CM/SW Contact:    Kermit Balo, RN Phone Number: 07/18/2019, 2:21 PM  Clinical Narrative:                 CM met with the patient and his son, Thayer Ohm at the bedside. They are interested in SNF rehab at d/c. They asked to have the patient faxed out it the [facility] area.  Covid test done 6/28. Pt has had both his vaccines.  [facility] went to review.  TOC following.  1700: information faxed to [facility]. Family chose [facility]. They are reviewing to see if able to offer a bed.    Expected Discharge Plan: Skilled Nursing Facility Barriers to Discharge: Continued Medical Work up   Patient Goals and CMS Choice   CMS Medicare.gov Compare Post Acute Care list provided to:: Patient Choice offered to / list presented to : Patient, Adult Children  Expected Discharge Plan and Services Expected Discharge Plan: Skilled Nursing Facility In-house Referral: Clinical Social Work Discharge Planning Services: CM Consult Post Acute Care Choice: Skilled Nursing Facility Living arrangements for the past 2 months: Single Family Home                                      Prior Living Arrangements/Services Living arrangements for the past 2 months: Single Family Home Lives with:: Self Patient language and need for interpreter reviewed:: Yes Do you feel safe going back to the place where you live?: Yes      Need for Family Participation in Patient Care: Yes (Comment) Care giver support system in place?: No (comment)   Criminal Activity/Legal Involvement Pertinent to Current Situation/Hospitalization: No - Comment as needed  Activities of Daily Living Home Assistive Devices/Equipment: Eyeglasses, Environmental consultant (specify type), Other (Comment) (back brace) ADL Screening (condition at time of admission) Patient's cognitive  ability adequate to safely complete daily activities?: No Is the patient deaf or have difficulty hearing?: Yes (trouble hearing certain sounds) Does the patient have difficulty seeing, even when wearing glasses/contacts?: Yes (trouble seeing out of right eye) Does the patient have difficulty concentrating, remembering, or making decisions?: Yes Patient able to express need for assistance with ADLs?: Yes Does the patient have difficulty dressing or bathing?: Yes Independently performs ADLs?: No Communication: Independent Dressing (OT): Independent Grooming: Independent Feeding: Independent Bathing: Needs assistance Is this a change from baseline?: Pre-admission baseline Toileting: Needs assistance Is this a change from baseline?: Pre-admission baseline In/Out Bed: Needs assistance Is this a change from baseline?: Pre-admission baseline Walks in Home: Needs assistance Is this a change from baseline?: Pre-admission baseline Does the patient have difficulty walking or climbing stairs?: Yes Weakness of Legs: Both Weakness of Arms/Hands: Both  Permission Sought/Granted                  Emotional Assessment Appearance:: Appears stated age Attitude/Demeanor/Rapport: Engaged Affect (typically observed): Accepting Orientation: : Oriented to Self, Oriented to Place, Oriented to  Time, Oriented to Situation   Psych Involvement: No (comment)  Admission diagnosis:  CVA (cerebral vascular accident) [facility]) [I63.9] 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 Chronic diastolic CHF (congestive heart failure) (HCC) 07/17/2019  . CVA (cerebral vascular  accident) ([place]) 07/16/2019  . Hyponatremia 07/16/2019  . Lumbar compression fracture ([place]) 07/16/2019  . CKD (chronic kidney disease), stage III 07/16/2019  . Multiple myeloma without remission ([place]) 07/03/2019  . Goals of care,  counseling/discussion 06/25/2019  . Coronary atherosclerosis 06/20/2019  . Aortic atherosclerosis ([place]) 06/20/2019  . Protein-calorie malnutrition ([place]) 05/01/2019  . Former smoker 04/30/2014  . Testicular swelling, left 04/30/2014  . BPH associated with nocturia 04/30/2014  . Prepatellar bursitis of right knee 01/22/2014  . Gout 01/08/2014  . Insomnia 10/29/2013  . Senile cataracts of both eyes 08/22/2013  . 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 Macular degeneration 02/03/2011  . Hyperlipidemia 06/17/2009  . History of arteritis 05/23/2007  . History of suicide attempt- while on high dose steroids. avoid prednisone.  01/19/2007  . Essential hypertension 07/21/2006  . GERD 07/21/2006   PCP:  [name], MD Pharmacy:   [id number] [place], [place] - [address] [place]  [address]Phone: [phone]: [phone] [facility] All Sites - [place] [address]Phone: [phone]: [phone]    Social Determinants of Health (SDOH) Interventions    Readmission Risk Interventions No flowsheet data found.

## 2019-07-18 NOTE — NC FL2 (Signed)
[place] MEDICAID FL2 LEVEL OF CARE SCREENING TOOL     IDENTIFICATION  Patient Name: Kevin Perez [facility] Birthdate: 15-Nov-1945 Sex: male Admission Date (Current Location): 07/16/2019  [facility] and [place] Number:  Producer, television/film/video and Address:  [facility], [address] [place], [place] [address]      Provider Number: [id number]  Attending Physician Name and Address:  Jerald Kief, MD  Relative Name and Phone Number:       Current Level of Care: Hospital Recommended Level of Care: Skilled Nursing Facility Prior Approval Number:    Date Approved/Denied:   PASRR Number: under review  Discharge Plan: SNF    Current Diagnoses: Patient Active Problem List   Diagnosis Date Noted  . Chronic diastolic CHF (congestive heart failure) (HCC) 07/17/2019  . CVA (cerebral vascular accident) (HCC) 07/16/2019  . Hyponatremia 07/16/2019  . Lumbar compression fracture (HCC) 07/16/2019  . CKD (chronic kidney disease), stage III 07/16/2019  . Multiple myeloma without remission (HCC) 07/03/2019  . Goals of care, counseling/discussion 06/25/2019  . Coronary atherosclerosis 06/20/2019  . Aortic atherosclerosis (HCC) 06/20/2019  . Protein-calorie malnutrition (HCC) 05/01/2019  . Former smoker 04/30/2014  . Testicular swelling, left 04/30/2014  . BPH associated with nocturia 04/30/2014  . Prepatellar bursitis of right knee 01/22/2014  . Gout 01/08/2014  . Insomnia 10/29/2013  . Senile cataracts of both eyes 08/22/2013  . 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 Macular degeneration 02/03/2011  . Hyperlipidemia 06/17/2009  . History of arteritis 05/23/2007  . History of suicide attempt- while on high dose steroids. avoid prednisone.  01/19/2007  . Essential hypertension 07/21/2006  . GERD 07/21/2006    Orientation RESPIRATION BLADDER Height & Weight     Self, Time, Situation, Place  Normal Incontinent  Weight: 55.4 kg Height:  5\' 6"  (167.6 cm)  BEHAVIORAL SYMPTOMS/MOOD NEUROLOGICAL BOWEL NUTRITION STATUS      Continent Diet (heart healthy with thins)  AMBULATORY STATUS COMMUNICATION OF NEEDS Skin   Limited Assist Verbally Normal                       Personal Care Assistance Level of Assistance  Bathing, Feeding, Dressing Bathing Assistance: Limited assistance Feeding assistance: Independent Dressing Assistance: Limited assistance     Functional Limitations Info  Sight, Hearing, Speech Sight Info: Impaired Hearing Info: Adequate Speech Info: Adequate    SPECIAL CARE FACTORS FREQUENCY  PT (By licensed PT), OT (By licensed OT)     PT Frequency: 5x/wk OT Frequency: 5x/wk            Contractures Contractures Info: Not present    Additional Factors Info  Code Status, Allergies, Psychotropic Code Status Info: Full Allergies Info: prednisone/ amoxicillin/ ace inhibitors/ codeine Psychotropic Info: Remeron 15 mg at bedtime         Current Medications (07/18/2019):  This is the current hospital active medication list Current Facility-Administered Medications  Medication Dose Route Frequency Provider Last Rate Last Admin  . acetaminophen (TYLENOL) suppository 650 mg  650 mg Rectal Q4H PRN 07/20/2019 D, MD      . acetaminophen (TYLENOL) tablet 1,000 mg  1,000 mg Oral Q8H Roberto Scales D, MD   1,000 mg at 07/18/19 0530  . allopurinol (ZYLOPRIM) tablet 100 mg  100 mg Oral Daily 07/20/19 D, MD   100 mg at 07/18/19 1014  . aspirin EC tablet 81 mg  81 mg  Oral Daily Rosalin Hawking, MD   81 mg at 07/18/19 1014  . atorvastatin (LIPITOR) tablet 80 mg  80 mg Oral q1800 Oretha Milch D, MD      . clopidogrel (PLAVIX) tablet 75 mg  75 mg Oral Daily Rosalin Hawking, MD   75 mg at 07/18/19 1014  . feeding supplement (ENSURE ENLIVE) (ENSURE ENLIVE) liquid 237 mL  237 mL Oral TID BM Allie Bossier, MD   237 mL at 07/18/19 1014  . haloperidol lactate (HALDOL) injection 2 mg  2 mg  Intravenous Q6H PRN Lang Snow, FNP   2 mg at 07/17/19 0043  . heparin injection 5,000 Units  5,000 Units Subcutaneous Q8H Oretha Milch D, MD   5,000 Units at 07/18/19 0531  . mirtazapine (REMERON) tablet 15 mg  15 mg Oral QHS Oretha Milch D, MD   15 mg at 07/17/19 2118  . multivitamin with minerals tablet 1 tablet  1 tablet Oral Daily Allie Bossier, MD   1 tablet at 07/18/19 1014  . oxyCODONE (Oxy IR/ROXICODONE) immediate release tablet 2.5 mg  2.5 mg Oral Q6H PRN Oretha Milch D, MD      . pantoprazole (PROTONIX) EC tablet 40 mg  40 mg Oral Daily Oretha Milch D, MD   40 mg at 07/18/19 1014  . potassium chloride SA (KLOR-CON) CR tablet 40 mEq  40 mEq Oral Once Donne Hazel, MD      . senna-docusate (Senokot-S) tablet 1 tablet  1 tablet Oral QHS PRN Desiree Hane, MD   1 tablet at 07/17/19 2117  . tamsulosin (FLOMAX) capsule 0.4 mg  0.4 mg Oral Daily Oretha Milch D, MD   0.4 mg at 07/18/19 1014   Facility-Administered Medications Ordered in Other Encounters  Medication Dose Route Frequency Provider Last Rate Last Admin  . vancomycin (VANCOCIN) 1,000 mg in sodium chloride 0.9 % 500 mL IVPB  1,000 mg Intravenous Once Jacqualine Mau, NP         Discharge Medications: Please see discharge summary for a list of discharge medications.  Relevant Imaging Results:  Relevant Lab Results:   Additional Information SS#: [id number]  Pollie Friar, RN

## 2019-07-19 ENCOUNTER — Encounter (HOSPITAL_COMMUNITY): Payer: Self-pay | Admitting: Internal Medicine

## 2019-07-19 LAB — COMPREHENSIVE METABOLIC PANEL
ALT: 13 U/L (ref 0–44)
AST: 17 U/L (ref 15–41)
Albumin: 3 g/dL — ABNORMAL LOW (ref 3.5–5.0)
Alkaline Phosphatase: 106 U/L (ref 38–126)
Anion gap: 8 (ref 5–15)
BUN: 12 mg/dL (ref 8–23)
CO2: 31 mmol/L (ref 22–32)
Calcium: 11.7 mg/dL — ABNORMAL HIGH (ref 8.9–10.3)
Chloride: 100 mmol/L (ref 98–111)
Creatinine, Ser: 1.13 mg/dL (ref 0.61–1.24)
GFR calc Af Amer: >60 mL/min (ref >60)
GFR calc non Af Amer: >60 mL/min (ref >60)
Glucose, Bld: 109 mg/dL — ABNORMAL HIGH (ref 70–99)
Potassium: 3.5 mmol/L (ref 3.5–5.1)
Sodium: 139 mmol/L (ref 135–145)
Total Bilirubin: 0.6 mg/dL (ref 0.3–1.2)
Total Protein: 6.7 g/dL (ref 6.5–8.1)

## 2019-07-19 LAB — CBC WITH DIFFERENTIAL/PLATELET
Abs Immature Granulocytes: 0.02 10*3/uL (ref 0.00–0.07)
Basophils Absolute: 0 10*3/uL (ref 0.0–0.1)
Basophils Relative: 1 %
Eosinophils Absolute: 0.2 10*3/uL (ref 0.0–0.5)
Eosinophils Relative: 3 %
HCT: 34 % — ABNORMAL LOW (ref 39.0–52.0)
Hemoglobin: 11.3 g/dL — ABNORMAL LOW (ref 13.0–17.0)
Immature Granulocytes: 0 %
Lymphocytes Relative: 37 %
Lymphs Abs: 2.2 10*3/uL (ref 0.7–4.0)
MCH: 30.3 pg (ref 26.0–34.0)
MCHC: 33.2 g/dL (ref 30.0–36.0)
MCV: 91.2 fL (ref 80.0–100.0)
Monocytes Absolute: 0.5 10*3/uL (ref 0.1–1.0)
Monocytes Relative: 8 %
Neutro Abs: 3.1 10*3/uL (ref 1.7–7.7)
Neutrophils Relative %: 51 %
Platelets: 281 10*3/uL (ref 150–400)
RBC: 3.73 MIL/uL — ABNORMAL LOW (ref 4.22–5.81)
RDW: 12.6 % (ref 11.5–15.5)
WBC: 6 10*3/uL (ref 4.0–10.5)
nRBC: 0 % (ref 0.0–0.2)

## 2019-07-19 LAB — HEMOGLOBIN A1C
Hgb A1c MFr Bld: 5.5 % (ref 4.8–5.6)
Mean Plasma Glucose: 111 mg/dL

## 2019-07-19 LAB — MAGNESIUM: Magnesium: 1.8 mg/dL (ref 1.7–2.4)

## 2019-07-19 LAB — PHOSPHORUS: Phosphorus: 3 mg/dL (ref 2.5–4.6)

## 2019-07-19 NOTE — Progress Notes (Signed)
Physical Therapy Treatment Patient Details Name: Kevin Perez MRN: [id number] DOB: [birth year] Today's Date: 07/19/2019    History of Present Illness Pt is a 74 y.o. male with PMH of multiple myeloma, HLD, HTN, severe low back pain, cataracts and vasculitis, who presented to the [facility] ED this morning with a chief complaint of binocular "fuzzy vision" since 07/11/2019. S/p back fusion 4-5 weeks ago. 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    PT Comments    Patient seen for mobility progression. Pt requires min-mod A for OOB mobility. Pt tolerated gait distance of 31f X 2 trials with use of rollator and seated break due to fatigue. Continue to progress as tolerated with anticipated d/c to SNF for further skilled PT services.     Follow Up Recommendations  SNF;Supervision/Assistance - 24 hour     Equipment Recommendations  Other (comment) (TBA next venue)    Recommendations for Other Services       Precautions / Restrictions Precautions Precautions: Fall;Back Precaution Comments: Able to state 2/3 precautions from memory, needed one cue to remember: no bending Restrictions Weight Bearing Restrictions: No    Mobility  Bed Mobility Overal bed mobility: Needs Assistance Bed Mobility: Sidelying to Sit;Rolling Rolling: Min guard Sidelying to sit: Min guard       General bed mobility comments: cues for sequencing; min guard for safety; use of rail and increased time and effort   Transfers Overall transfer level: Needs assistance Equipment used: 1 person hand held assist (assist at trunk with gait belt) Transfers: Sit to/from Stand Sit to Stand: Min assist;Mod assist         General transfer comment: min A to power up into standing and then mod A upon standing to maintain balance in standing due to posterior bias  Ambulation/Gait Ambulation/Gait assistance: Min  assist;Mod assist;+2 safety/equipment Gait Distance (Feet):  (75 ft X 2 trials with seated break on rollator ) Assistive device: 4-wheeled walker Gait Pattern/deviations: Step-through pattern;Decreased stance time - left;Drifts right/left;Leaning posteriorly;Trunk flexed Gait velocity: decreased   General Gait Details: pt with heavy posterior bias initially requiring mod A for balance and weight shifting; min A with use of rollator; seated break due to fatigue; cues for upright posture/forward gaze and safe use of AD   Stairs             Wheelchair Mobility    Modified Rankin (Stroke Patients Only) Modified Rankin (Stroke Patients Only) Pre-Morbid Rankin Score: No symptoms Modified Rankin: Moderately severe disability     Balance Overall balance assessment: Needs assistance Sitting-balance support: Feet supported;No upper extremity supported Sitting balance-Leahy Scale: Fair     Standing balance support: During functional activity;Bilateral upper extremity supported Standing balance-Leahy Scale: Poor Standing balance comment: reliant on external support                            Cognition Arousal/Alertness: Awake/alert Behavior During Therapy: WFL for tasks assessed/performed;Impulsive Overall Cognitive Status: Impaired/Different from baseline (son seemed to act like he may be close to baseline??) Area of Impairment: Memory;Following commands;Safety/judgement;Problem solving                   Current Attention Level: Sustained Memory: Decreased short-term memory;Decreased recall of precautions Following Commands: Follows one step commands with increased time Safety/Judgement: Decreased awareness of safety;Decreased awareness of deficits Awareness: Intellectual Problem Solving:  Difficulty sequencing;Requires verbal cues;Requires tactile cues;Slow processing General Comments: pt with increased participation with brace and recalling precautions,  however requires min-mod A to ambulate and complete tasks due to impulsivity, pt noted to be much more fluid with rollator, however required cues for appropriate pacing      Exercises      General Comments        Pertinent Vitals/Pain Pain Assessment: Faces Faces Pain Scale: Hurts even more Pain Location: back and back brace Pain Descriptors / Indicators: Guarding;Discomfort Pain Intervention(s): Limited activity within patient's tolerance;Monitored during session;Repositioned    Home Living                      Prior Function            PT Goals (current goals can now be found in the care plan section) Acute Rehab PT Goals Patient Stated Goal: go home Progress towards PT goals: Progressing toward goals    Frequency    Min 3X/week      PT Plan Current plan remains appropriate    Co-evaluation PT/OT/SLP Co-Evaluation/Treatment: Yes Reason for Co-Treatment: Complexity of the patient's impairments (multi-system involvement);Necessary to address cognition/behavior during functional activity;For patient/therapist safety;To address functional/ADL transfers PT goals addressed during session: Mobility/safety with mobility;Balance        AM-PAC PT "6 Clicks" Mobility   Outcome Measure  Help needed turning from your back to your side while in a flat bed without using bedrails?: A Little Help needed moving from lying on your back to sitting on the side of a flat bed without using bedrails?: A Little Help needed moving to and from a bed to a chair (including a wheelchair)?: A Lot Help needed standing up from a chair using your arms (e.g., wheelchair or bedside chair)?: A Little Help needed to walk in hospital room?: A Lot Help needed climbing 3-5 steps with a railing? : A Lot 6 Click Score: 15    End of Session Equipment Utilized During Treatment: Gait belt;Back brace Activity Tolerance: Patient tolerated treatment well Patient left: with family/visitor  present;Other (comment) (pt with OT end of session) Nurse Communication: Mobility status PT Visit Diagnosis: Unsteadiness on feet (R26.81);Other symptoms and signs involving the nervous system (R29.898);Other abnormalities of gait and mobility (R26.89)     Time: 8563-1497 PT Time Calculation (min) (ACUTE ONLY): 15 min  Charges:  $Gait Training: 8-22 mins                     Earney Navy, PTA Acute Rehabilitation Services Pager: [phone] Office: [phone]     Darliss Cheney 07/19/2019, 4:27 PM

## 2019-07-19 NOTE — Progress Notes (Signed)
PROGRESS NOTE    Kevin Perez [facility]  EPP:[id number] DOB: 21-Aug-1945 DOA: 07/16/2019 PCP: [name], MD    Brief Narrative:  74 y.o. male with medical history significant for recently diagnosed multiple myeloma, history of vasculitis, HLD, HTN and anxiety.  Patient is a poor historian as he is a bit confused.  Most of history obtained by chart review is supplemented by son at bedside.  He stated that his dad was in his usual state of health until his brother caught him around 4:58 PM last night concerned that his father was complaining of trouble seeing as well as increased clumsiness stating he knocked down his drink at dinner twice which is unlike him.  Per chart review by ED provider symptoms reportedly started 5 days prior to this admission with an acute change in his vision patient stating he cannot "seem to focus on anything" as well as endorsing intermittent headaches that occurred 24 hours ago.  Of note, patient and son report back surgery (fusion) for pathologic fracture about 4 to 5 weeks prior.  Patient has been using a back brace as well as as needed use of oxycodone for pain control.  His son reports he tends to get more confused than taking his pain medication ED Course:   Afebrile, respiratory rate 22, hemodynamically stable, blood pressure range 94/73-115/62.  INR 1.1, UDS unremarkable SARS PCR negative.  Sodium 134, creatinine 1.7, BUN 24, calcium 11.7.  Hemoglobin 11.1.  ESR 61.  CRP 7.7.  Chest x-ray showed cardiomegaly with possible atelectasis.  MRI brain/orbit showed normal orbits but several punctate acute infarctions in the right cerebellar consistent with microembolic disease.  Neurology was consulted [facility] was called for further management and evaluation of patient with blurred vision found to have multiple acute infarcts in cerebellum.  Assessment & Plan:   Active Problems:   Hyperlipidemia   Essential hypertension   History of  arteritis   GERD   Gout   BPH associated with nocturia   Multiple myeloma without remission (HCC)   CVA (cerebral vascular accident) ([place])   Hyponatremia   Lumbar compression fracture (HCC)   CKD (chronic kidney disease), stage III   Chronic diastolic CHF (congestive heart failure) ([place])   Blurry vision secondary to several punctate acute infarct in the right cerebellum, left occipital/parietal occipital lobe, concern for microembolic disease -Neurology following, recommend 30 day event monitoring -cont DAPT x 3 weeks then plavix alone afterwards -SNF pending -Family reports some improvement over the past 48hrs. Seems stable   Hypotension SBP's in the 90s to low 100s on presentation.  Suspect related to diminished oral intake witnessed by family. -BP currently stable and had trended up  Remote history of (05/2007) arteritis. ESR AND CRP elevated.  Blurry vision seems more consistent with CVA.  Doubt temporal arteritis, no jaw claudication or temporal lobe tenderness. -Continue to monitor. Discussed with Neurology. Given concerns of intolerance to steroids in the past, would continue to hold off for now and recommend close outpatient follow up  HTN. Blood pressure actually little soft on admission with low of SBP in the 90s, currently 115 -Currently hold off on home losartan, HCTZ for now. May gradually resume as BP tolerates. Given recent CVA, would not aggressively treat or over-treat BP  Multiple myeloma not having received remission with lytic bone lesions Newly diagnosed 06/2019.  Bone marrow biopsy 6/15  confirmed plasma cell myeloma -followed outpatient by Dr. Lorenso Courier (oncology), on Revlimid as outpatient -Seems to be stable  currently  CKD stage IIIa, stable Creatinine stable at baseline of 1.2 -Avoid nephrotoxins -Cr today 1.13 , stable and voiding well  GERD -continue PPI  Hyperlipidemia -Increase statin to high intensity dosing given CVA  Gout,  stable -Continue allopurinol  Status post IR vertebral body augmentation for painful compression fracture at L4 using vertebroplasty technique (05/31/2019) Back pain stable currently  Last ED for worsening back pain on 07/09/2019 -Neurosurgery recommended TLSO brace for comfort on 07/09/19 given failed improvement in symptoms with kyphoplasty -Currently on oxycodone to 2.5 mg q6hPRN -Delirium protocol -Continue with bowel regimen  BPH, stable -Monitor output. Condom cath at present -On Flomax  Constipation -Eventually very good results with bowel regimen  DVT prophylaxis: Heparin subq Code Status: Full Family Communication: Pt in room, family at bedside  Status is: Inpatient  Remains inpatient appropriate because:Unsafe d/c plan   Dispo: The patient is from: Home              Anticipated d/c is to: SNF              Anticipated d/c date is: 1 day              Patient currently is medically stable to d/c. Just awaiting SNF placement   Consultants:   Neurology  Procedures:     Antimicrobials: Anti-infectives (From admission, onward)   None      Subjective: Multiple BM overnight. Complaining of uncomfortable thoracic hardware. Working with PT when seen  Objective: Vitals:   07/19/19 0319 07/19/19 0330 07/19/19 0755 07/19/19 1245  BP: (!) 153/71  (!) 167/77 (!) (P) 149/74  Pulse: 78  74 (P) 85  Resp: 15  16 (P) 18  Temp: 98.7 F (37.1 C)  98.4 F (36.9 C) (P) 97.7 F (36.5 C)  TempSrc: Oral  Oral (P) Oral  SpO2: 97%  99% (P) 100%  Weight:  55.3 kg    Height:        Intake/Output Summary (Last 24 hours) at 07/19/2019 1526 Last data filed at 07/19/2019 0931 Gross per 24 hour  Intake 240 ml  Output 1100 ml  Net -860 ml   Filed Weights   07/16/19 2109 07/18/19 0500 07/19/19 0330  Weight: 53.9 kg 55.4 kg 55.3 kg    Examination: General exam: Conversant, in no acute distress Respiratory system: normal chest rise, clear, no audible  wheezing Cardiovascular system: regular rhythm, s1-s2 Gastrointestinal system: Nondistended, nontender, pos BS Central nervous system: No seizures, no tremors Extremities: No cyanosis, no joint deformities Skin: No rashes, no pallor Psychiatry: Affect normal // no auditory hallucinations   Data Reviewed: I have personally reviewed following labs and imaging studies  CBC: Recent Labs  Lab 07/16/19 1040 07/17/19 0319 07/18/19 0409 07/19/19 0312  WBC 8.1 6.9 5.7 6.0  NEUTROABS 6.5  --  3.4 3.1  HGB 11.1* 9.8* 11.3* 11.3*  HCT 32.9* 29.9* 34.7* 34.0*  MCV 91.6 91.4 91.3 91.2  PLT 236 210 246 262   Basic Metabolic Panel: Recent Labs  Lab 07/16/19 1040 07/17/19 0319 07/18/19 0409 07/19/19 0312  NA 134* 136 137 139  K 3.6 3.2* 3.1* 3.5  CL 93* 99 99 100  CO2 32 '30 29 31  '$ GLUCOSE 130* 101* 108* 109*  BUN 24* '19 18 12  '$ CREATININE 1.27* 1.16 1.03 1.13  CALCIUM 11.7* 11.6* 11.6* 11.7*  MG  --   --  1.7 1.8  PHOS  --   --  3.1 3.0   GFR: Estimated Creatinine  Clearance: 44.9 mL/min (by C-G formula based on SCr of 1.13 mg/dL). Liver Function Tests: Recent Labs  Lab 07/16/19 1040 07/18/19 0409 07/19/19 0312  AST '19 18 17  '$ ALT '13 13 13  '$ ALKPHOS 128* 110 106  BILITOT 0.6 0.3 0.6  PROT 8.0 7.2 6.7  ALBUMIN 3.6 3.0* 3.0*   No results for input(s): LIPASE, AMYLASE in the last 168 hours. No results for input(s): AMMONIA in the last 168 hours. Coagulation Profile: Recent Labs  Lab 07/16/19 1701  INR 1.1   Cardiac Enzymes: No results for input(s): CKTOTAL, CKMB, CKMBINDEX, TROPONINI in the last 168 hours. BNP (last 3 results) No results for input(s): PROBNP in the last 8760 hours. HbA1C: Recent Labs    07/18/19 0409  HGBA1C 5.5   CBG: No results for input(s): GLUCAP in the last 168 hours. Lipid Profile: Recent Labs    07/18/19 0409  CHOL 118  HDL 30*  LDLCALC 67  TRIG 103  CHOLHDL 3.9   Thyroid Function Tests: No results for input(s): TSH, T4TOTAL,  FREET4, T3FREE, THYROIDAB in the last 72 hours. Anemia Panel: No results for input(s): VITAMINB12, FOLATE, FERRITIN, TIBC, IRON, RETICCTPCT in the last 72 hours. Sepsis Labs: No results for input(s): PROCALCITON, LATICACIDVEN in the last 168 hours.  Recent Results (from the past 240 hour(s))  SARS Coronavirus 2 by RT PCR (hospital order, performed in [facility] hospital lab) Nasopharyngeal Nasopharyngeal Swab     Status: None   Collection Time: 07/16/19  3:16 PM   Specimen: Nasopharyngeal Swab  Result Value Ref Range Status   SARS Coronavirus 2 NEGATIVE NEGATIVE Final    Comment: (NOTE) SARS-CoV-2 target nucleic acids are NOT DETECTED.  The SARS-CoV-2 RNA is generally detectable in upper and lower respiratory specimens during the acute phase of infection. The lowest concentration of SARS-CoV-2 viral copies this assay can detect is 250 copies / mL. A negative result does not preclude SARS-CoV-2 infection and should not be used as the sole basis for treatment or other patient management decisions.  A negative result may occur with improper specimen collection / handling, submission of specimen other than nasopharyngeal swab, presence of viral mutation(s) within the areas targeted by this assay, and inadequate number of viral copies (<250 copies / mL). A negative result must be combined with clinical observations, patient history, and epidemiological information.  Fact Sheet for Patients:   StrictlyIdeas.no  Fact Sheet for Healthcare Providers: BankingDealers.co.za  This test is not yet approved or  cleared by the [place] FDA and has been authorized for detection and/or diagnosis of SARS-CoV-2 by FDA under an Emergency Use Authorization (EUA).  This EUA will remain in effect (meaning this test can be used) for the duration of the COVID-19 declaration under Section 564(b)(1) of the Act, 21 U.S.C. section 360bbb-3(b)(1), unless the  authorization is terminated or revoked sooner.  Performed at [facility], [place] [address], [place], [place] [address]   Culture, Urine     Status: Abnormal   Collection Time: 07/16/19  5:04 PM   Specimen: Urine, Random  Result Value Ref Range Status   Specimen Description   Final    URINE, RANDOM Performed at [place] [address], [place], [place] [address]    Special Requests   Final    NONE Performed at [facility], [place] [address] [place], [place] [address]    Culture (A)  Final    <10,000 COLONIES/mL INSIGNIFICANT GROWTH Performed at [facility], [place] [address], [place], [place] [address]    Report Status 07/18/2019 FINAL  Final     Radiology Studies: No results found.  Scheduled Meds: . acetaminophen  1,000 mg Oral Q8H  . allopurinol  100 mg Oral Daily  . aspirin EC  81 mg Oral Daily  . atorvastatin  80 mg Oral q1800  . clopidogrel  75 mg Oral Daily  . feeding supplement (ENSURE ENLIVE)  237 mL Oral TID BM  . heparin  5,000 Units Subcutaneous Q8H  . mirtazapine  15 mg Oral QHS  . multivitamin with minerals  1 tablet Oral Daily  . pantoprazole  40 mg Oral Daily  . polyethylene glycol powder  1 Container Oral Once  . tamsulosin  0.4 mg Oral Daily   Continuous Infusions:   LOS: 3 days   Marylu Lund, MD [facility] Pager On Amion  If 7PM-7AM, please contact night-coverage 07/19/2019, 3:26 PM

## 2019-07-19 NOTE — Consult Note (Signed)
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 Moderately severe compression fracture of L4 with bone month edema consistent with a more recent fracture. 3) 05/31/2019: patient underwent IRvertebral body augmentation for painful compression fracture at L4 using vertebroplasty technique 4) 06/20/2019: CT C/A/P demonstrated diffuse, heterogeneously lytic, moth-eaten appearance of the osseous structures, most consistent with multiple myeloma.SPEP showed M protein 1.5, Kapp 29.9, Lambda 16.1, Ratio 1.86.  5) 06/25/2019: establish care with Dr. Lorenso Courier 6) 07/03/2019: Bone marrow biopsy confirmed plasma cell myeloma with 30% lambda restricted plasma cells.  7) 07/09/2019: presented for discussion of diagnosis/treatment. Taken to ED for severe 10/10 worsening back pain.  8) 07/16/2019: Hospital admission for punctate acute infarctions in the right cerebellum.  HPI:  Kevin Perez 74 y.o. male with medical history significant for GERD, gout, HLD, and HTN who presented to the [facility] with diffuse lytic lesions.   On review of the previous records Kevin Perez had an MRI of his spine performed on 05/19/2019 which showed compression fractures of T12, L1, L2, and L3.  There was a moderately severe compression of L4.  On 05/31/2019 he underwent an IR vertebral body augmentation with vertebroplasty.  On 06/20/2019 the patient underwent a CT chest abdomen pelvis which demonstrated heterogeneous lytic moth-eaten appearance of the osseous structures most consistent with multiple myeloma.  SPEP ordered at the time showed an M protein of 1.5, kappa of 29.2, lambda of 16.1 with a ratio of 1.86.   Due to concern for these findings the patient was referred to hematology for further evaluation management.  The patient underwent a bone marrow biopsy on 07/03/2019 which confirmed the diagnosis of IgG lambda multiple myeloma.  Plan was for the patient to begin VRd chemotherapy on 07/18/2019, however, the patient has been hospitalized and has not yet started treatment.  The patient presented to the emergency room due to blurred vision.  MRI of the brain/orbit showed normal orbits but several punctate acute infarctions in the right cerebellum and 2-4 in the left occipital and parietooccipital junction region.  Findings consistent with microembolic disease.  The patient has been seen by neurology who recommended MRI of the brain, TTE, carotid Dopplers.  MRA head was negative.  Carotid Doppler showed 1 to 39% stenosis in both the right and left carotids.  Neurology also recommending a 30-day cardiac event monitoring as outpatient to rule out A. fib.  He has been started on heparin for VTE prophylaxis.  He is also on aspirin 81 mg daily and Plavix 75 mg daily.  Plan to continue both for 3 weeks and then Plavix alone.  Today, the patient is resting in bed.  His son is at the bedside.  The patient reports that his vision has improved.  Pain is better without brace on.  Son reports that parts of the brace are digging into his lower abdomen causing him pain.  The patient is not having any fevers, chills, nausea, vomiting, diarrhea.  Appetite is fair.  Son states patient will be discharged to SNF for rehab.  A comprehensive 14 point review of systems is otherwise noncontributory.  Medical oncology was asked see the patient to make recommendations regarding his recently diagnosed multiple myeloma.   Past Medical History:  Diagnosis Date  . Cancer ([place])   . GERD 07/21/2006  . Gout   . HYPERLIPIDEMIA 06/17/2009  . HYPERTENSION 07/21/2006  . Raynaud's syndrome 07/21/2006  . Suicide attempt [facility])    Due to Prednisone!  Marland Kitchen  VASCULITIS 05/23/2007  :  Past Surgical History:  Procedure Laterality Date  . HERNIA REPAIR  01/24/08   umbilical hernia  . INGUINAL HERNIA REPAIR     Bil  . IR VERTEBROPLASTY LUMBAR BX INC UNI/BIL INC/INJECT/IMAGING  05/31/2019  :  Current Facility-Administered Medications  Medication Dose Route Frequency Provider Last Rate Last Admin  . acetaminophen (TYLENOL) suppository 650 mg  650 mg Rectal Q4H PRN Oretha Milch D, MD      . acetaminophen (TYLENOL) tablet 1,000 mg  1,000 mg Oral Q8H Oretha Milch D, MD   1,000 mg at 07/19/19 9604  . allopurinol (ZYLOPRIM) tablet 100 mg  100 mg Oral Daily Oretha Milch D, MD   100 mg at 07/19/19 0841  . aspirin EC tablet 81 mg  81 mg Oral Daily Rosalin Hawking, MD   81 mg at 07/19/19 0841  . atorvastatin (LIPITOR) tablet 80 mg  80 mg Oral q1800 Oretha Milch D, MD      . clopidogrel (PLAVIX) tablet 75 mg  75 mg Oral Daily Rosalin Hawking, MD   75 mg at 07/19/19 0841  . feeding supplement (ENSURE ENLIVE) (ENSURE ENLIVE) liquid 237 mL  237 mL Oral TID BM Allie Bossier, MD   237 mL at 07/19/19 0842  . haloperidol lactate (HALDOL) injection 2 mg  2 mg Intravenous Q6H PRN Lang Snow, FNP   2 mg at 07/17/19 0043  . heparin injection 5,000 Units  5,000 Units Subcutaneous Q8H Oretha Milch D, MD   5,000 Units at 07/19/19 [phone]  . mirtazapine (REMERON) tablet 15 mg  15 mg Oral QHS Oretha Milch D, MD   15 mg at 07/18/19 2133  . multivitamin with minerals tablet 1 tablet  1 tablet Oral Daily Allie Bossier, MD   1 tablet at 07/19/19 [phone]  . oxyCODONE (Oxy IR/ROXICODONE) immediate release tablet 2.5 mg  2.5 mg Oral Q6H PRN Oretha Milch D, MD      . pantoprazole (PROTONIX) EC tablet 40 mg  40 mg Oral Daily Oretha Milch D, MD   40 mg at 07/19/19 0841  . polyethylene glycol powder (GLYCOLAX/MIRALAX) container 255 g  1 Container Oral Once Donne Hazel, MD      . senna-docusate (Senokot-S) tablet 1 tablet  1 tablet Oral QHS PRN Desiree Hane, MD   1 tablet  at 07/17/19 2117  . tamsulosin (FLOMAX) capsule 0.4 mg  0.4 mg Oral Daily Oretha Milch D, MD   0.4 mg at 07/19/19 0840   Facility-Administered Medications Ordered in Other Encounters  Medication Dose Route Frequency Provider Last Rate Last Admin  . vancomycin (VANCOCIN) 1,000 mg in sodium chloride 0.9 % 500 mL IVPB  1,000 mg Intravenous Once Jacqualine Mau, NP         Allergies  Allergen Reactions  . Prednisone     Patient attributes suicide attempt to prednisone use.   Marland Kitchen Amoxicillin-Pot Clavulanate Nausea And Vomiting  . Ace Inhibitors     REACTION: cough  . Codeine Phosphate     REACTION: vomiting  :  Family History  Problem Relation Age of Onset  . Other Mother        passed age 59  . Hypertension Father   . Prostate cancer  Father 64       never operated, not cause of death  . Stroke Father   . Meniere's disease Father   . Early death Paternal Grandfather   . Bladder Cancer Son   :  Social History   Socioeconomic History  . Marital status: Married    Spouse name: Not on file  . Number of children: 2  . Years of education: Not on file  . Highest education level: Not on file  Occupational History  . Occupation: Retired   Tobacco Use  . Smoking status: Former Smoker    Packs/day: 0.05    Years: 20.00    Pack years: 1.00    Types: Cigarettes  . Smokeless tobacco: Former Neurosurgeon    Types: Chew    Quit date: 06/26/1989  . Tobacco comment: chewed tobacco until 89 more than he smoked   Vaping Use  . Vaping Use: Never used  Substance and Sexual Activity  . Alcohol use: Yes    Alcohol/week: 2.0 standard drinks    Types: 2 Standard drinks or equivalent per week    Comment: with meals on occasion  . Drug use: No  . Sexual activity: Not Currently  Other Topics Concern  . Not on file  Social History Narrative   Widowed 2019- was Married 1967. 2 kids. 1 grandchild. 1 greatgrandchilden.       Retired 2003 from [employer]      Hobbies:  fishing, shagging, exercise, cruise, 19 GTO convertible   Social Determinants of Health   Financial Resource Strain:   . Difficulty of Paying Living Expenses:   Food Insecurity:   . Worried About Programme researcher, broadcasting/film/video in the Last Year:   . Barista in the Last Year:   Transportation Needs:   . Freight forwarder (Medical):   Marland Kitchen Lack of Transportation (Non-Medical):   Physical Activity:   . Days of Exercise per Week:   . Minutes of Exercise per Session:   Stress:   . Feeling of Stress :   Social Connections:   . Frequency of Communication with Friends and Family:   . Frequency of Social Gatherings with Friends and Family:   . Attends Religious Services:   . Active Member of Clubs or Organizations:   . Attends Banker Meetings:   Marland Kitchen Marital Status:   Intimate Partner Violence:   . Fear of Current or Ex-Partner:   . Emotionally Abused:   Marland Kitchen Physically Abused:   . Sexually Abused:   :  Review of Systems: A comprehensive 14 point review of systems was negative except as noted in the HPI.  Exam: Patient Vitals for the past 24 hrs:  BP Temp Temp src Pulse Resp SpO2 Weight  07/19/19 1245 (!) (P) 149/74 (P) 97.7 F (36.5 C) (P) Oral (P) 85 (P) 18 (P) 100 % --  07/19/19 0755 (!) 167/77 98.4 F (36.9 C) Oral 74 16 99 % --  07/19/19 0330 -- -- -- -- -- -- 55.3 kg  07/19/19 0319 (!) 153/71 98.7 F (37.1 C) Oral 78 15 97 % --  07/18/19 2330 131/75 98 F (36.7 C) Oral 70 18 96 % --  07/18/19 2003 122/71 98.4 F (36.9 C) Oral 77 17 96 % --  07/18/19 1613 (!) 149/76 -- Oral -- -- -- --  07/18/19 1612 -- 98.2 F (36.8 C) -- -- -- -- --    General: Elderly male, no distress Eyes:  no scleral icterus.  ENT:  There were no oropharyngeal lesions.   Neck was without thyromegaly.   Lymphatics:  Negative cervical, supraclavicular or axillary adenopathy.   Respiratory: lungs were clear bilaterally without wheezing or crackles.   Cardiovascular:  Regular rate and  rhythm, S1/S2, without murmur, rub or gallop.  There was no pedal edema.   GI:  abdomen was soft, flat, nontender, nondistended, without organomegaly.   Musculoskeletal:  no spinal tenderness of palpation of vertebral spine.   Skin exam was without echymosis, petichae.   Neuro exam was nonfocal. Patient was alert and oriented.   Lab Results  Component Value Date   WBC 6.0 07/19/2019   HGB 11.3 (L) 07/19/2019   HCT 34.0 (L) 07/19/2019   PLT 281 07/19/2019   GLUCOSE 109 (H) 07/19/2019   CHOL 118 07/18/2019   TRIG 103 07/18/2019   HDL 30 (L) 07/18/2019   LDLDIRECT 94.0 08/10/2017   LDLCALC 67 07/18/2019   ALT 13 07/19/2019   AST 17 07/19/2019   NA 139 07/19/2019   K 3.5 07/19/2019   CL 100 07/19/2019   CREATININE 1.13 07/19/2019   BUN 12 07/19/2019   CO2 31 07/19/2019    DG Chest 2 View  Result Date: 07/16/2019 CLINICAL DATA:  CVA EXAM: CHEST - 2 VIEW COMPARISON:  05/01/2019 FINDINGS: Mild cardiomegaly. Areas of scarring in the left mid lung. Scarring or atelectasis in the lung bases. No effusions or acute bony abnormality. IMPRESSION: Cardiomegaly. Areas of scarring or atelectasis as above. No acute cardiopulmonary disease. Electronically Signed   By: Rolm Baptise M.D.   On: 07/16/2019 17:45   DG Lumbar Spine Complete  Result Date: 07/09/2019 CLINICAL DATA:  Worsening pain, multiple myeloma EXAM: LUMBAR SPINE - COMPLETE 4+ VIEW COMPARISON:  06/06/2019 FINDINGS: Severe osteopenia. Chronic L4 vertebral body compression fracture with prior augmentation and approximately 70% height loss. 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 Severe L1 vertebral body compression fracture with increased height loss compared with 06/06/2019 likely reflecting a acute-subacute fracture superimposed upon a chronic fracture with currently approximately 80% height loss. Degenerative disease with severe disc height loss and endplate sclerosis at Q4-O9. Diffuse bilateral facet arthropathy  of the lumbar spine. IMPRESSION: 1. Severe L1 vertebral body compression fracture with increased height loss compared with 06/06/2019 likely reflecting an acute-subacute fracture superimposed upon a chronic fracture with currently approximately 80% height loss. 2. 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 Electronically Signed   By: Kathreen Devoid   On: 07/09/2019 11:17   MR ANGIO HEAD WO CONTRAST  Result Date: 07/16/2019 CLINICAL DATA:  Follow-up examination for posterior circulation infarcts. EXAM: MRA HEAD WITHOUT CONTRAST TECHNIQUE: Angiographic images of the Circle of Willis were obtained using MRA technique without intravenous contrast. COMPARISON:  Prior brain MRI from earlier the same day. FINDINGS: ANTERIOR CIRCULATION: Examination degraded by motion artifact. Visualized distal cervical segments of the internal carotid arteries are widely patent with symmetric antegrade flow. Petrous, cavernous, and supraclinoid ICAs patent without appreciable stenosis or other abnormality. A1 segments patent bilaterally. Grossly normal anterior communicating artery complex. Anterior cerebral arteries patent to their distal aspects without appreciable stenosis. No M1 stenosis or occlusion. Negative MCA bifurcations. Distal MCA branches well perfused and symmetric. POSTERIOR CIRCULATION: Vertebral arteries widely patent to the vertebrobasilar junction without stenosis. Right vertebral artery dominant. Both picas patent. Basilar widely patent to its distal aspect without stenosis. Superior cerebral arteries patent bilaterally. Both PCAs primarily supplied via the basilar and are well perfused  to their distal aspects without stenosis. No intracranial aneurysm or other vascular abnormality. IMPRESSION: Negative intracranial MRA. No large vessel occlusion. No hemodynamically significant or correctable stenosis. Electronically Signed   By: Jeannine Boga M.D.   On: 07/16/2019 22:15   MR Brain W  and Wo Contrast  Result Date: 07/16/2019 CLINICAL DATA:  Vision loss. 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 History of multiple myeloma. EXAM: MRI HEAD AND ORBITS WITHOUT AND WITH CONTRAST TECHNIQUE: Multiplanar, multiecho pulse sequences of the brain and surrounding structures were obtained without and with intravenous contrast. Multiplanar, multiecho pulse sequences of the orbits and surrounding structures were obtained including fat saturation techniques, before and after intravenous contrast administration. CONTRAST:  26m GADAVIST GADOBUTROL 1 MMOL/ML IV SOLN COMPARISON:  Head CT 07/26/1998 FINDINGS: MRI HEAD FINDINGS Brain: Diffusion imaging shows a punctate acute infarction within the right superior cerebellum. Punctate acute infarction present in the left occipital cortex and at the left parietooccipital junction region. No large confluent infarction. Elsewhere, brain shows mild chronic small-vessel disease of hemispheric white matter. No mass, hemorrhage, hydrocephalus or extra-axial collection. After contrast administration, no abnormal enhancement occurs. Vascular: Major vessels at the base of the brain show flow. Skull and upper cervical spine: Negative Other: None MRI ORBITS FINDINGS Orbits: Both globes appear normal. Both optic nerves are normal. Orbital fat is normal. Extra-ocular muscles are normal. No abnormal contrast enhancement. Visualized sinuses: Normal Soft tissues: Normal Limited intracranial: See results of brain MRI. IMPRESSION: MRI orbits: Normal MRI head: Several punctate acute infarctions including 1 in the right cerebellum and 2 to 4 in the left occipital and parietooccipital junction region. 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 No large confluent infarction. No mass effect or hemorrhage. Mild chronic small-vessel ischemic change elsewhere affecting the cerebral hemispheric white matter Electronically Signed   By: MNelson Chimes M.D.   On: 07/16/2019 15:00   DG Bone Survey Met  Result Date: 07/04/2019 CLINICAL DATA:  Lytic lesions on x-ray EXAM: METASTATIC BONE SURVEY COMPARISON:  CT chest abdomen pelvis dated 06/19/2019 FINDINGS: A few tiny sub 5 mm lytic lesions in the skull are suspected. Permeative moth-eaten lytic appearance involving the bilateral humeri, without discrete focal lytic lesions. Bilateral upper extremities are otherwise unremarkable. Known dominant lytic lesion involving the left posterior 9th rib on CT is not radiographically evident. However, there are nondisplaced fractures involving the left lateral 3rd and 4th ribs No definite lytic lesions in the visualized cervical, thoracic, or lumbar spine. Moderate compression fracture deformities at T12-L2. Prior vertebral augmentation at L4. Subcentimeter lytic lesion in the right mid femur. Permeative mild the lytic appearance involving the left femur. Bilateral lower extremities are otherwise unremarkable. IMPRESSION: A few tiny sub 5 mm lytic lesions in the skull and a single subcentimeter lytic lesion in the right mid femur. Known dominant lytic lesion involving the left posterior 9th rib on CT is not radiographically evident. Nondisplaced fractures involving the left lateral 3rd and 4th ribs. Permeative, moth-eaten lytic appearance involving the bilateral humeri and left femur. Moderate compression fracture deformities at T12-L2. Prior vertebral augmentation at L4. Electronically Signed   By: SJulian HyM.D.   On: 07/04/2019 16:39   ECHOCARDIOGRAM COMPLETE  Result Date: 07/17/2019    ECHOCARDIOGRAM REPORT   Patient Name:   Kevin GUSTERDate of Exam: 07/17/2019 Medical Rec #:  [id number]       Height:       66.0 in Accession #:  [id number]       Weight:       118.8 lb Date of Birth:  1945/12/18         BSA:          1.603 m Patient Age:    12 years         BP:           126/65 mmHg Patient Gender: M                HR:           72 bpm. Exam Location:   Inpatient Procedure: 2D Echo Indications:    Stroke 434.91 / I163.9  History:        Patient has no prior history of Echocardiogram examinations.                 Risk Factors:Hypertension and Dyslipidemia. Multiple myeloma,                 Raynaud's syndrome.  Sonographer:    Darlina Sicilian RDCS Referring Phys: [id number] [facility] D NETTEY  Sonographer Comments: Patient is restless and unoriented making exam very difficult IMPRESSIONS  1. Left ventricular ejection fraction, by estimation, is 60 to 65%. The left ventricle has normal function. The left ventricle has no regional wall motion abnormalities. Left ventricular diastolic parameters are consistent with Grade I diastolic dysfunction (impaired relaxation).  2. Right ventricular systolic function is normal. The right ventricular size is normal.  3. The mitral valve is normal in structure. No evidence of mitral valve regurgitation. No evidence of mitral stenosis.  4. The aortic valve is tricuspid. Aortic valve regurgitation is not visualized. Mild to moderate aortic valve sclerosis/calcification is present, without any evidence of aortic stenosis.  5. The inferior vena cava is normal in size with greater than 50% respiratory variability, suggesting right atrial pressure of 3 mmHg. FINDINGS  Left Ventricle: Left ventricular ejection fraction, by estimation, is 60 to 65%. The left ventricle has normal function. The left ventricle has no regional wall motion abnormalities. The left ventricular internal cavity size was normal in size. There is  no left ventricular hypertrophy. Left ventricular diastolic parameters are consistent with Grade I diastolic dysfunction (impaired relaxation). Normal left ventricular filling pressure. Right Ventricle: The right ventricular size is normal. No increase in right ventricular wall thickness. Right ventricular systolic function is normal. Left Atrium: Left atrial size was normal in size. Right Atrium: Right atrial size was normal in  size. Pericardium: There is no evidence of pericardial effusion. Mitral Valve: The mitral valve is normal in structure. Normal mobility of the mitral valve leaflets. No evidence of mitral valve regurgitation. No evidence of mitral valve stenosis. Tricuspid Valve: The tricuspid valve is normal in structure. Tricuspid valve regurgitation is not demonstrated. No evidence of tricuspid stenosis. Aortic Valve: The aortic valve is tricuspid. Aortic valve regurgitation is not visualized. Mild to moderate aortic valve sclerosis/calcification is present, without any evidence of aortic stenosis. Pulmonic Valve: The pulmonic valve was normal in structure. Pulmonic valve regurgitation is not visualized. No evidence of pulmonic stenosis. Aorta: The aortic root is normal in size and structure. Venous: The inferior vena cava is normal in size with greater than 50% respiratory variability, suggesting right atrial pressure of 3 mmHg. IAS/Shunts: No atrial level shunt detected by color flow Doppler.  LEFT VENTRICLE PLAX 2D LVIDd:         3.60 cm  Diastology LVIDs:  2.70 cm  LV e' lateral:   9.36 cm/s LV PW:         1.10 cm  LV E/e' lateral: 8.6 LV IVS:        1.00 cm  LV e' medial:    6.31 cm/s LVOT diam:     2.40 cm  LV E/e' medial:  12.8 LVOT Area:     4.52 cm  RIGHT VENTRICLE RV S prime:     11.70 cm/s TAPSE (M-mode): 1.6 cm LEFT ATRIUM           Index LA diam:      2.70 cm 1.68 cm/m LA Vol (A4C): 26.0 ml 16.22 ml/m   AORTA Ao Root diam: 3.30 cm MITRAL VALVE MV Area (PHT): 3.77 cm     SHUNTS MV Decel Time: 201 msec     Systemic Diam: 2.40 cm MV E velocity: 80.60 cm/s MV A velocity: 101.00 cm/s MV E/A ratio:  0.80 Mihai Croitoru MD Electronically signed by Sanda Klein MD Signature Date/Time: 07/17/2019/11:09:10 AM    Final    VAS US CAROTID (at [facility] and [facility] only)  Result Date: 07/18/2019 Carotid Arterial Duplex Study Indications:   CVA. Risk Factors:  Hypertension, hyperlipidemia. Other Factors: Several punctate acute  infarctions. Performing Technologist: June Leap RDMS, RVT  Examination Guidelines: A complete evaluation includes B-mode imaging, spectral Doppler, color Doppler, and power Doppler as needed of all accessible portions of each vessel. Bilateral testing is considered an integral part of a complete examination. Limited examinations for reoccurring indications may be performed as noted.  Right Carotid Findings: +----------+--------+--------+--------+------------------+--------+           PSV cm/sEDV cm/sStenosisPlaque DescriptionComments +----------+--------+--------+--------+------------------+--------+ CCA Prox  86      15                                         +----------+--------+--------+--------+------------------+--------+ CCA Distal77      15                                         +----------+--------+--------+--------+------------------+--------+ ICA Prox  73      21      1-39%   heterogenous               +----------+--------+--------+--------+------------------+--------+ ICA Distal98      24                                         +----------+--------+--------+--------+------------------+--------+ ECA       104     11                                         +----------+--------+--------+--------+------------------+--------+ +----------+--------+-------+----------------+-------------------+           PSV cm/sEDV cmsDescribe        Arm Pressure (mmHG) +----------+--------+-------+----------------+-------------------+ Subclavian119            Multiphasic, WNL                    +----------+--------+-------+----------------+-------------------+ +---------+--------+--+--------+--+---------+ VertebralPSV cm/s50EDV cm/s14Antegrade +---------+--------+--+--------+--+---------+  Left Carotid Findings: +----------+--------+--------+--------+------------------+--------+  PSV cm/sEDV cm/sStenosisPlaque DescriptionComments  +----------+--------+--------+--------+------------------+--------+ CCA Prox  130     20                                         +----------+--------+--------+--------+------------------+--------+ CCA Distal114     17                                         +----------+--------+--------+--------+------------------+--------+ ICA Prox  99      25      1-39%   heterogenous               +----------+--------+--------+--------+------------------+--------+ ICA Distal89      20                                         +----------+--------+--------+--------+------------------+--------+ ECA       102     11                                         +----------+--------+--------+--------+------------------+--------+ +----------+--------+--------+----------------+-------------------+           PSV cm/sEDV cm/sDescribe        Arm Pressure (mmHG) +----------+--------+--------+----------------+-------------------+ [id number]              Multiphasic, WNL                    +----------+--------+--------+----------------+-------------------+ +---------+--------+--+--------+--+---------+ VertebralPSV cm/s48EDV cm/s12Antegrade +---------+--------+--+--------+--+---------+   Summary: Right Carotid: Velocities in the right ICA are consistent with a 1-39% stenosis. Left Carotid: Velocities in the left ICA are consistent with a 1-39% stenosis. Vertebrals:  Bilateral vertebral arteries demonstrate antegrade flow. Subclavians: Normal flow hemodynamics were seen in bilateral subclavian              arteries. *See table(s) above for measurements and observations.  Electronically signed by Delia Heady MD on 07/18/2019 at 9:33:43 AM.    Final    MR ORBITS W WO CONTRAST  Result Date: 07/16/2019 CLINICAL DATA:  Vision loss. 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 History of multiple myeloma. EXAM: MRI HEAD AND ORBITS WITHOUT AND WITH CONTRAST TECHNIQUE: Multiplanar,  multiecho pulse sequences of the brain and surrounding structures were obtained without and with intravenous contrast. Multiplanar, multiecho pulse sequences of the orbits and surrounding structures were obtained including fat saturation techniques, before and after intravenous contrast administration. CONTRAST:  30mL GADAVIST GADOBUTROL 1 MMOL/ML IV SOLN COMPARISON:  Head CT 07/26/1998 FINDINGS: MRI HEAD FINDINGS Brain: Diffusion imaging shows a punctate acute infarction within the right superior cerebellum. Punctate acute infarction present in the left occipital cortex and at the left parietooccipital junction region. No large confluent infarction. Elsewhere, brain shows mild chronic small-vessel disease of hemispheric white matter. No mass, hemorrhage, hydrocephalus or extra-axial collection. After contrast administration, no abnormal enhancement occurs. Vascular: Major vessels at the base of the brain show flow. Skull and upper cervical spine: Negative Other: None MRI ORBITS FINDINGS Orbits: Both globes appear normal. Both optic nerves are normal. Orbital fat is normal. Extra-ocular muscles are normal. No abnormal contrast enhancement. Visualized sinuses: Normal Soft tissues: Normal Limited intracranial: See results of  brain MRI. IMPRESSION: MRI orbits: Normal MRI head: Several punctate acute infarctions including 1 in the right cerebellum and 2 to 4 in the left occipital and parietooccipital junction region. 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 No large confluent infarction. No mass effect or hemorrhage. Mild chronic small-vessel ischemic change elsewhere affecting the cerebral hemispheric white matter Electronically Signed   By: Nelson Chimes M.D.   On: 07/16/2019 15:00     DG Chest 2 View  Result Date: 07/16/2019 CLINICAL DATA:  CVA EXAM: CHEST - 2 VIEW COMPARISON:  05/01/2019 FINDINGS: Mild cardiomegaly. Areas of scarring in the left mid lung. Scarring or  atelectasis in the lung bases. No effusions or acute bony abnormality. IMPRESSION: Cardiomegaly. Areas of scarring or atelectasis as above. No acute cardiopulmonary disease. Electronically Signed   By: Rolm Baptise M.D.   On: 07/16/2019 17:45   DG Lumbar Spine Complete  Result Date: 07/09/2019 CLINICAL DATA:  Worsening pain, multiple myeloma EXAM: LUMBAR SPINE - COMPLETE 4+ VIEW COMPARISON:  06/06/2019 FINDINGS: Severe osteopenia. Chronic L4 vertebral body compression fracture with prior augmentation and approximately 70% height loss. 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 Severe L1 vertebral body compression fracture with increased height loss compared with 06/06/2019 likely reflecting a acute-subacute fracture superimposed upon a chronic fracture with currently approximately 80% height loss. Degenerative disease with severe disc height loss and endplate sclerosis at N0-N3. Diffuse bilateral facet arthropathy of the lumbar spine. IMPRESSION: 1. Severe L1 vertebral body compression fracture with increased height loss compared with 06/06/2019 likely reflecting an acute-subacute fracture superimposed upon a chronic fracture with currently approximately 80% height loss. 2. 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 Electronically Signed   By: Kathreen Devoid   On: 07/09/2019 11:17   MR ANGIO HEAD WO CONTRAST  Result Date: 07/16/2019 CLINICAL DATA:  Follow-up examination for posterior circulation infarcts. EXAM: MRA HEAD WITHOUT CONTRAST TECHNIQUE: Angiographic images of the Circle of Willis were obtained using MRA technique without intravenous contrast. COMPARISON:  Prior brain MRI from earlier the same day. FINDINGS: ANTERIOR CIRCULATION: Examination degraded by motion artifact. Visualized distal cervical segments of the internal carotid arteries are widely patent with symmetric antegrade flow. Petrous, cavernous, and supraclinoid ICAs patent without appreciable  stenosis or other abnormality. A1 segments patent bilaterally. Grossly normal anterior communicating artery complex. Anterior cerebral arteries patent to their distal aspects without appreciable stenosis. No M1 stenosis or occlusion. Negative MCA bifurcations. Distal MCA branches well perfused and symmetric. POSTERIOR CIRCULATION: Vertebral arteries widely patent to the vertebrobasilar junction without stenosis. Right vertebral artery dominant. Both picas patent. Basilar widely patent to its distal aspect without stenosis. Superior cerebral arteries patent bilaterally. Both PCAs primarily supplied via the basilar and are well perfused to their distal aspects without stenosis. No intracranial aneurysm or other vascular abnormality. IMPRESSION: Negative intracranial MRA. No large vessel occlusion. No hemodynamically significant or correctable stenosis. Electronically Signed   By: Jeannine Boga M.D.   On: 07/16/2019 22:15   MR Brain W and Wo Contrast  Result Date: 07/16/2019 CLINICAL DATA:  Vision loss. 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 History of multiple myeloma. EXAM: MRI HEAD AND ORBITS WITHOUT AND WITH CONTRAST TECHNIQUE: Multiplanar, multiecho pulse sequences of the brain and surrounding structures were obtained without and with intravenous contrast. Multiplanar, multiecho pulse sequences of the orbits and surrounding structures were obtained including fat saturation techniques, before and after intravenous contrast administration. CONTRAST:  13m GADAVIST  GADOBUTROL 1 MMOL/ML IV SOLN COMPARISON:  Head CT 07/26/1998 FINDINGS: MRI HEAD FINDINGS Brain: Diffusion imaging shows a punctate acute infarction within the right superior cerebellum. Punctate acute infarction present in the left occipital cortex and at the left parietooccipital junction region. No large confluent infarction. Elsewhere, brain shows mild chronic small-vessel disease of hemispheric white matter. No mass,  hemorrhage, hydrocephalus or extra-axial collection. After contrast administration, no abnormal enhancement occurs. Vascular: Major vessels at the base of the brain show flow. Skull and upper cervical spine: Negative Other: None MRI ORBITS FINDINGS Orbits: Both globes appear normal. Both optic nerves are normal. Orbital fat is normal. Extra-ocular muscles are normal. No abnormal contrast enhancement. Visualized sinuses: Normal Soft tissues: Normal Limited intracranial: See results of brain MRI. IMPRESSION: MRI orbits: Normal MRI head: Several punctate acute infarctions including 1 in the right cerebellum and 2 to 4 in the left occipital and parietooccipital junction region. 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 No large confluent infarction. No mass effect or hemorrhage. Mild chronic small-vessel ischemic change elsewhere affecting the cerebral hemispheric white matter Electronically Signed   By: Nelson Chimes M.D.   On: 07/16/2019 15:00   DG Bone Survey Met  Result Date: 07/04/2019 CLINICAL DATA:  Lytic lesions on x-ray EXAM: METASTATIC BONE SURVEY COMPARISON:  CT chest abdomen pelvis dated 06/19/2019 FINDINGS: A few tiny sub 5 mm lytic lesions in the skull are suspected. Permeative moth-eaten lytic appearance involving the bilateral humeri, without discrete focal lytic lesions. Bilateral upper extremities are otherwise unremarkable. Known dominant lytic lesion involving the left posterior 9th rib on CT is not radiographically evident. However, there are nondisplaced fractures involving the left lateral 3rd and 4th ribs No definite lytic lesions in the visualized cervical, thoracic, or lumbar spine. Moderate compression fracture deformities at T12-L2. Prior vertebral augmentation at L4. Subcentimeter lytic lesion in the right mid femur. Permeative mild the lytic appearance involving the left femur. Bilateral lower extremities are otherwise unremarkable. IMPRESSION: A few  tiny sub 5 mm lytic lesions in the skull and a single subcentimeter lytic lesion in the right mid femur. Known dominant lytic lesion involving the left posterior 9th rib on CT is not radiographically evident. Nondisplaced fractures involving the left lateral 3rd and 4th ribs. Permeative, moth-eaten lytic appearance involving the bilateral humeri and left femur. Moderate compression fracture deformities at T12-L2. Prior vertebral augmentation at L4. Electronically Signed   By: Julian Hy M.D.   On: 07/04/2019 16:39   ECHOCARDIOGRAM COMPLETE  Result Date: 07/17/2019    ECHOCARDIOGRAM REPORT   Patient Name:   Kevin Perez Date of Exam: 07/17/2019 Medical Rec #:  [id number]        Height:       66.0 in Accession #:    [id number]       Weight:       118.8 lb Date of Birth:  08/10/1945         BSA:          1.603 m Patient Age:    33 years         BP:           126/65 mmHg Patient Gender: M                HR:           72 bpm. Exam Location:  Inpatient Procedure: 2D Echo Indications:    Stroke 434.91 / I163.9  History:  Patient has no prior history of Echocardiogram examinations.                 Risk Factors:Hypertension and Dyslipidemia. Multiple myeloma,                 Raynaud's syndrome.  Sonographer:    Darlina Sicilian RDCS Referring Phys: [id number] [facility] D NETTEY  Sonographer Comments: Patient is restless and unoriented making exam very difficult IMPRESSIONS  1. Left ventricular ejection fraction, by estimation, is 60 to 65%. The left ventricle has normal function. The left ventricle has no regional wall motion abnormalities. Left ventricular diastolic parameters are consistent with Grade I diastolic dysfunction (impaired relaxation).  2. Right ventricular systolic function is normal. The right ventricular size is normal.  3. The mitral valve is normal in structure. No evidence of mitral valve regurgitation. No evidence of mitral stenosis.  4. The aortic valve is tricuspid. Aortic valve regurgitation  is not visualized. Mild to moderate aortic valve sclerosis/calcification is present, without any evidence of aortic stenosis.  5. The inferior vena cava is normal in size with greater than 50% respiratory variability, suggesting right atrial pressure of 3 mmHg. FINDINGS  Left Ventricle: Left ventricular ejection fraction, by estimation, is 60 to 65%. The left ventricle has normal function. The left ventricle has no regional wall motion abnormalities. The left ventricular internal cavity size was normal in size. There is  no left ventricular hypertrophy. Left ventricular diastolic parameters are consistent with Grade I diastolic dysfunction (impaired relaxation). Normal left ventricular filling pressure. Right Ventricle: The right ventricular size is normal. No increase in right ventricular wall thickness. Right ventricular systolic function is normal. Left Atrium: Left atrial size was normal in size. Right Atrium: Right atrial size was normal in size. Pericardium: There is no evidence of pericardial effusion. Mitral Valve: The mitral valve is normal in structure. Normal mobility of the mitral valve leaflets. No evidence of mitral valve regurgitation. No evidence of mitral valve stenosis. Tricuspid Valve: The tricuspid valve is normal in structure. Tricuspid valve regurgitation is not demonstrated. No evidence of tricuspid stenosis. Aortic Valve: The aortic valve is tricuspid. Aortic valve regurgitation is not visualized. Mild to moderate aortic valve sclerosis/calcification is present, without any evidence of aortic stenosis. Pulmonic Valve: The pulmonic valve was normal in structure. Pulmonic valve regurgitation is not visualized. No evidence of pulmonic stenosis. Aorta: The aortic root is normal in size and structure. Venous: The inferior vena cava is normal in size with greater than 50% respiratory variability, suggesting right atrial pressure of 3 mmHg. IAS/Shunts: No atrial level shunt detected by color flow  Doppler.  LEFT VENTRICLE PLAX 2D LVIDd:         3.60 cm  Diastology LVIDs:         2.70 cm  LV e' lateral:   9.36 cm/s LV PW:         1.10 cm  LV E/e' lateral: 8.6 LV IVS:        1.00 cm  LV e' medial:    6.31 cm/s LVOT diam:     2.40 cm  LV E/e' medial:  12.8 LVOT Area:     4.52 cm  RIGHT VENTRICLE RV S prime:     11.70 cm/s TAPSE (M-mode): 1.6 cm LEFT ATRIUM           Index LA diam:      2.70 cm 1.68 cm/m LA Vol (A4C): 26.0 ml 16.22 ml/m   AORTA Ao Root diam: 3.30 cm  MITRAL VALVE MV Area (PHT): 3.77 cm     SHUNTS MV Decel Time: 201 msec     Systemic Diam: 2.40 cm MV E velocity: 80.60 cm/s MV A velocity: 101.00 cm/s MV E/A ratio:  0.80 Mihai Croitoru MD Electronically signed by Sanda Klein MD Signature Date/Time: 07/17/2019/11:09:10 AM    Final    VAS US CAROTID (at [facility] and [facility] only)  Result Date: 07/18/2019 Carotid Arterial Duplex Study Indications:   CVA. Risk Factors:  Hypertension, hyperlipidemia. Other Factors: Several punctate acute infarctions. Performing Technologist: June Leap RDMS, RVT  Examination Guidelines: A complete evaluation includes B-mode imaging, spectral Doppler, color Doppler, and power Doppler as needed of all accessible portions of each vessel. Bilateral testing is considered an integral part of a complete examination. Limited examinations for reoccurring indications may be performed as noted.  Right Carotid Findings: +----------+--------+--------+--------+------------------+--------+           PSV cm/sEDV cm/sStenosisPlaque DescriptionComments +----------+--------+--------+--------+------------------+--------+ CCA Prox  86      15                                         +----------+--------+--------+--------+------------------+--------+ CCA Distal77      15                                         +----------+--------+--------+--------+------------------+--------+ ICA Prox  73      21      1-39%   heterogenous                +----------+--------+--------+--------+------------------+--------+ ICA Distal98      24                                         +----------+--------+--------+--------+------------------+--------+ ECA       104     11                                         +----------+--------+--------+--------+------------------+--------+ +----------+--------+-------+----------------+-------------------+           PSV cm/sEDV cmsDescribe        Arm Pressure (mmHG) +----------+--------+-------+----------------+-------------------+ Subclavian119            Multiphasic, WNL                    +----------+--------+-------+----------------+-------------------+ +---------+--------+--+--------+--+---------+ VertebralPSV cm/s50EDV cm/s14Antegrade +---------+--------+--+--------+--+---------+  Left Carotid Findings: +----------+--------+--------+--------+------------------+--------+           PSV cm/sEDV cm/sStenosisPlaque DescriptionComments +----------+--------+--------+--------+------------------+--------+ CCA Prox  130     20                                         +----------+--------+--------+--------+------------------+--------+ CCA Distal114     17                                         +----------+--------+--------+--------+------------------+--------+ ICA Prox  99      25      1-39%  heterogenous               +----------+--------+--------+--------+------------------+--------+ ICA Distal89      20                                         +----------+--------+--------+--------+------------------+--------+ ECA       102     11                                         +----------+--------+--------+--------+------------------+--------+ +----------+--------+--------+----------------+-------------------+           PSV cm/sEDV cm/sDescribe        Arm Pressure (mmHG) +----------+--------+--------+----------------+-------------------+ [id number]               Multiphasic, WNL                    +----------+--------+--------+----------------+-------------------+ +---------+--------+--+--------+--+---------+ VertebralPSV cm/s48EDV cm/s12Antegrade +---------+--------+--+--------+--+---------+   Summary: Right Carotid: Velocities in the right ICA are consistent with a 1-39% stenosis. Left Carotid: Velocities in the left ICA are consistent with a 1-39% stenosis. Vertebrals:  Bilateral vertebral arteries demonstrate antegrade flow. Subclavians: Normal flow hemodynamics were seen in bilateral subclavian              arteries. *See table(s) above for measurements and observations.  Electronically signed by Antony Contras MD on 07/18/2019 at 9:33:43 AM.    Final    MR ORBITS W WO CONTRAST  Result Date: 07/16/2019 CLINICAL DATA:  Vision loss. 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 History of multiple myeloma. EXAM: MRI HEAD AND ORBITS WITHOUT AND WITH CONTRAST TECHNIQUE: Multiplanar, multiecho pulse sequences of the brain and surrounding structures were obtained without and with intravenous contrast. Multiplanar, multiecho pulse sequences of the orbits and surrounding structures were obtained including fat saturation techniques, before and after intravenous contrast administration. CONTRAST:  95m GADAVIST GADOBUTROL 1 MMOL/ML IV SOLN COMPARISON:  Head CT 07/26/1998 FINDINGS: MRI HEAD FINDINGS Brain: Diffusion imaging shows a punctate acute infarction within the right superior cerebellum. Punctate acute infarction present in the left occipital cortex and at the left parietooccipital junction region. No large confluent infarction. Elsewhere, brain shows mild chronic small-vessel disease of hemispheric white matter. No mass, hemorrhage, hydrocephalus or extra-axial collection. After contrast administration, no abnormal enhancement occurs. Vascular: Major vessels at the base of the brain show flow. Skull and upper cervical spine: Negative Other: None  MRI ORBITS FINDINGS Orbits: Both globes appear normal. Both optic nerves are normal. Orbital fat is normal. Extra-ocular muscles are normal. No abnormal contrast enhancement. Visualized sinuses: Normal Soft tissues: Normal Limited intracranial: See results of brain MRI. IMPRESSION: MRI orbits: Normal MRI head: Several punctate acute infarctions including 1 in the right cerebellum and 2 to 4 in the left occipital and parietooccipital junction region. 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 No large confluent infarction. No mass effect or hemorrhage. Mild chronic small-vessel ischemic change elsewhere affecting the cerebral hemispheric white matter Electronically Signed   By: MNelson ChimesM.D.   On: 07/16/2019 15:00   Assessment and Plan:  Kevin Perez 74y.o. male with medical history significant for newly diagnosed IgG lambda MM. Treatment plan consists of bortezomib 1.'3mg'$ /m2 on Day 1, 8, and 15, dexamethasone '40mg'$  PO Day 1, 8, and 15, and Lenalidomide '25mg'$  PO  day 1-14. Cycles are to be repeated every 21 days and can be continued x 8 cycles. This can be reduced to maintenance Vd or Rd if patient has good response. He will also be receiving monthly zometa therapy for lytic bone lesions.   Of note, the patient has history of suicide attempt on prednisone. This was discussed with patient and his son who were unsure what role this medication played in his suicide attempt. This regimen contains dexamethasone, which is essential for his treatment. We will attempt full dose at '40mg'$ , but if psychiatric issues arise we can decrease this.  The patient was scheduled to begin  treatment on 07/18/2019, but has not yet started treatment secondary to hospital admission.  He is now admitted for 3 acute punctate infarct at the right cerebellum, left MCA/PCA, and left superior PCA.  Source unclear.  Neurology is following and recommends 30-day cardiac event monitoring as an outpatient  to rule out A. fib for the patient to take aspirin 81 mg daily and Plavix 75 mg daily for 3 weeks and then Plavix alone.  The patient is planning to discharge to SNF.  Likely discharge date will be 07/20/2019.  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  He is already scheduled in our office on 07/26/2019 and will keep this appointment as scheduled. --Revlimid has been approved.  Son does not yet have this medication, but plans to contact the pharmacy to obtain. --details of VRd regimen above (Blood [phone]): [id number]).  --I do not see that he has had a chemotherapy education class yet.  This will need to be scheduled. --additionally will plan for monthly zometa as well (2 years of duration)  #Lytic Bone Lesions #Compression fracture #Hypercalcemia --patient has received dental clearance for Zometa therapy --will plan to start this next week with his chemotherapy regimen --continue to monitor   #Supportive Care --supportive therapy with acyclovir '400mg'$  BID and ASA '81mg'$  PO daily.  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  We will need to determine if Plavix alone will be sufficient given that he is going on Revlimid or if we will need to continue the aspirin 81 mg daily --SNF placement pending for short-term rehabilitation.  Mikey Bussing, DNP, AGPCNP-BC, AOCNP

## 2019-07-19 NOTE — Progress Notes (Signed)
Occupational Therapy Treatment Patient Details Name: Kevin Perez MRN: [id number] DOB: [birth year] Today's Date: 07/19/2019    History of present illness Pt is a 74 y.o. male with PMH of multiple myeloma, HLD, HTN, severe low back pain, cataracts and vasculitis, who presented to the [facility] ED this morning with a chief complaint of binocular "fuzzy vision" since 07/11/2019. S/p back fusion 4-5 weeks ago. 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   OT comments  Patient continues to make steady progress towards goals in skilled OT session. Patient's session encompassed functional ambulation, reiteration of back precautions, and completion of ADL tasks. Pt is oriented x4, however remains impulsive and unsafe in ambulation with HHA. Pt with significant improvement with rollator during session, taking one seated rest break for pacing. Pt unsteady when standing at sink, requiring increased assist in order to adhere to back precautions when brushing teeth. Discharge remains appropriate at this time; will continue to follow acutely.    Follow Up Recommendations  SNF;Supervision/Assistance - 24 hour (may progress to [facility] services)    Equipment Recommendations  None recommended by OT    Recommendations for Other Services      Precautions / Restrictions Precautions Precautions: Fall;Back Precaution Comments: Able to state 2/3 precautions from memory, needed one cue to remember: no bending Restrictions Weight Bearing Restrictions: No       Mobility Bed Mobility Overal bed mobility: Needs Assistance Bed Mobility: Sidelying to Sit;Rolling Rolling: Min guard Sidelying to sit: Min guard          Transfers Overall transfer level: Needs assistance Equipment used: 2 person hand held assist;4-wheeled walker Transfers: Sit to/from Stand Sit to Stand: Min assist;+2 safety/equipment          General transfer comment: Min A with physical assist with Mod cues for attention to task. Attempted to use no AD, however with increased ability to ambulate and maneuver with rollator    Balance Overall balance assessment: Needs assistance Sitting-balance support: Feet supported;No upper extremity supported Sitting balance-Leahy Scale: Fair     Standing balance support: During functional activity Standing balance-Leahy Scale: Poor Standing balance comment: reliant on external support                           ADL either performed or assessed with clinical judgement   ADL Overall ADL's : Needs assistance/impaired     Grooming: Moderate assistance;Cueing for safety;Cueing for sequencing;Standing;Oral care;Wash/dry hands;Wash/dry face;Minimal assistance Grooming Details (indicate cue type and reason): Provided compensatory strategies due to precautions and brace, due to fatigue and posture, required min-mod A in order to maintain                 Toilet Transfer: Ambulation;Regular Toilet;Minimal assistance Toilet Transfer Details (indicate cue type and reason): Min A for safety         Functional mobility during ADLs: Moderate assistance;Minimal assistance;Cueing for safety;Cueing for sequencing General ADL Comments: Able to adhere to back precautions with exception of completing oral care, pt remains impulsive and progresses with increased control with rollator and second set of hands for safety     Vision       Perception     Praxis      Cognition Arousal/Alertness: Awake/alert Behavior During Therapy: [facility] for tasks assessed/performed;Impulsive Overall Cognitive Status: Impaired/Different from baseline Area of Impairment: Attention;Memory;Following commands;Safety/judgement;Awareness;Problem solving  Current Attention Level: Sustained Memory: Decreased short-term memory;Decreased recall of precautions Following Commands:  Follows one step commands with increased time Safety/Judgement: Decreased awareness of safety;Decreased awareness of deficits Awareness: Intellectual Problem Solving: Difficulty sequencing;Requires verbal cues;Requires tactile cues;Slow processing General Comments: pt with increased participation with brace and recalling precautions, however requires min-mod A to ambulate and complete tasks due to impulsivity, pt noted to be much more fluid with rollator, however required cues for appropriate pacing        Exercises     Shoulder Instructions       General Comments      Pertinent Vitals/ Pain       Pain Assessment: Faces Faces Pain Scale: Hurts even more Pain Location: Back brace digging in Pain Descriptors / Indicators: Guarding;Discomfort Pain Intervention(s): Limited activity within patient's tolerance;Monitored during session;Repositioned  Home Living                                          Prior Functioning/Environment              Frequency  Min 2X/week        Progress Toward Goals  OT Goals(current goals can now be found in the care plan section)  Progress towards OT goals: Progressing toward goals  Acute Rehab OT Goals Patient Stated Goal: go home OT Goal Formulation: With patient/family Time For Goal Achievement: 07/31/19 Potential to Achieve Goals: Good  Plan Discharge plan remains appropriate    Co-evaluation                 AM-PAC OT "6 Clicks" Daily Activity     Outcome Measure   Help from another person eating meals?: None Help from another person taking care of personal grooming?: A Little Help from another person toileting, which includes using toliet, bedpan, or urinal?: A Little Help from another person bathing (including washing, rinsing, drying)?: A Lot Help from another person to put on and taking off regular upper body clothing?: A Little Help from another person to put on and taking off regular lower body  clothing?: A Lot 6 Click Score: 17    End of Session Equipment Utilized During Treatment: Gait belt;Back brace  OT Visit Diagnosis: Unsteadiness on feet (R26.81);Muscle weakness (generalized) (M62.81);Other symptoms and signs involving the nervous system (R29.898);Other symptoms and signs involving cognitive function;Pain;Low vision, both eyes (H54.2)   Activity Tolerance Patient tolerated treatment well   Patient Left in chair;with call bell/phone within reach;with family/visitor present   Nurse Communication Mobility status        Time: 8937-3428 OT Time Calculation (min): 14 min  Charges: OT General Charges $OT Visit: 1 Visit OT Treatments $Self Care/Home Management : 8-22 mins   Corinne [place], [place] [facility] [place] 07/19/2019, 2:45 PM

## 2019-07-20 DIAGNOSIS — I776 Arteritis, unspecified: Secondary | ICD-10-CM | POA: Diagnosis not present

## 2019-07-20 DIAGNOSIS — F419 Anxiety disorder, unspecified: Secondary | ICD-10-CM | POA: Diagnosis not present

## 2019-07-20 DIAGNOSIS — S32000S Wedge compression fracture of unspecified lumbar vertebra, sequela: Secondary | ICD-10-CM | POA: Diagnosis not present

## 2019-07-20 DIAGNOSIS — M25511 Pain in right shoulder: Secondary | ICD-10-CM | POA: Diagnosis not present

## 2019-07-20 DIAGNOSIS — N400 Enlarged prostate without lower urinary tract symptoms: Secondary | ICD-10-CM | POA: Diagnosis not present

## 2019-07-20 DIAGNOSIS — E871 Hypo-osmolality and hyponatremia: Secondary | ICD-10-CM | POA: Diagnosis not present

## 2019-07-20 DIAGNOSIS — Z7982 Long term (current) use of aspirin: Secondary | ICD-10-CM | POA: Diagnosis not present

## 2019-07-20 DIAGNOSIS — I69398 Other sequelae of cerebral infarction: Secondary | ICD-10-CM | POA: Diagnosis not present

## 2019-07-20 DIAGNOSIS — Z5112 Encounter for antineoplastic immunotherapy: Secondary | ICD-10-CM | POA: Diagnosis not present

## 2019-07-20 DIAGNOSIS — N183 Chronic kidney disease, stage 3 unspecified: Secondary | ICD-10-CM | POA: Diagnosis not present

## 2019-07-20 DIAGNOSIS — H269 Unspecified cataract: Secondary | ICD-10-CM | POA: Diagnosis not present

## 2019-07-20 DIAGNOSIS — I63113 Cerebral infarction due to embolism of bilateral vertebral arteries: Secondary | ICD-10-CM

## 2019-07-20 DIAGNOSIS — I69318 Other symptoms and signs involving cognitive functions following cerebral infarction: Secondary | ICD-10-CM | POA: Diagnosis not present

## 2019-07-20 DIAGNOSIS — R278 Other lack of coordination: Secondary | ICD-10-CM | POA: Diagnosis not present

## 2019-07-20 DIAGNOSIS — R1310 Dysphagia, unspecified: Secondary | ICD-10-CM | POA: Diagnosis not present

## 2019-07-20 DIAGNOSIS — K5900 Constipation, unspecified: Secondary | ICD-10-CM | POA: Diagnosis not present

## 2019-07-20 DIAGNOSIS — H3530 Unspecified macular degeneration: Secondary | ICD-10-CM | POA: Diagnosis not present

## 2019-07-20 DIAGNOSIS — K219 Gastro-esophageal reflux disease without esophagitis: Secondary | ICD-10-CM | POA: Diagnosis not present

## 2019-07-20 DIAGNOSIS — I700 Atherosclerosis of aorta: Secondary | ICD-10-CM | POA: Diagnosis not present

## 2019-07-20 DIAGNOSIS — I5032 Chronic diastolic (congestive) heart failure: Secondary | ICD-10-CM | POA: Diagnosis not present

## 2019-07-20 DIAGNOSIS — I69991 Dysphagia following unspecified cerebrovascular disease: Secondary | ICD-10-CM | POA: Diagnosis not present

## 2019-07-20 DIAGNOSIS — I130 Hypertensive heart and chronic kidney disease with heart failure and stage 1 through stage 4 chronic kidney disease, or unspecified chronic kidney disease: Secondary | ICD-10-CM | POA: Diagnosis not present

## 2019-07-20 DIAGNOSIS — I25119 Atherosclerotic heart disease of native coronary artery with unspecified angina pectoris: Secondary | ICD-10-CM | POA: Diagnosis not present

## 2019-07-20 DIAGNOSIS — M6281 Muscle weakness (generalized): Secondary | ICD-10-CM | POA: Diagnosis not present

## 2019-07-20 DIAGNOSIS — I639 Cerebral infarction, unspecified: Secondary | ICD-10-CM | POA: Diagnosis not present

## 2019-07-20 DIAGNOSIS — Z8659 Personal history of other mental and behavioral disorders: Secondary | ICD-10-CM | POA: Diagnosis not present

## 2019-07-20 DIAGNOSIS — E46 Unspecified protein-calorie malnutrition: Secondary | ICD-10-CM | POA: Diagnosis not present

## 2019-07-20 DIAGNOSIS — H40003 Preglaucoma, unspecified, bilateral: Secondary | ICD-10-CM | POA: Diagnosis not present

## 2019-07-20 DIAGNOSIS — C9000 Multiple myeloma not having achieved remission: Secondary | ICD-10-CM | POA: Diagnosis not present

## 2019-07-20 DIAGNOSIS — K224 Dyskinesia of esophagus: Secondary | ICD-10-CM | POA: Diagnosis not present

## 2019-07-20 DIAGNOSIS — Z87891 Personal history of nicotine dependence: Secondary | ICD-10-CM | POA: Diagnosis not present

## 2019-07-20 DIAGNOSIS — N1831 Chronic kidney disease, stage 3a: Secondary | ICD-10-CM | POA: Diagnosis not present

## 2019-07-20 DIAGNOSIS — I7300 Raynaud's syndrome without gangrene: Secondary | ICD-10-CM | POA: Diagnosis not present

## 2019-07-20 DIAGNOSIS — M25512 Pain in left shoulder: Secondary | ICD-10-CM | POA: Diagnosis not present

## 2019-07-20 DIAGNOSIS — E785 Hyperlipidemia, unspecified: Secondary | ICD-10-CM | POA: Diagnosis not present

## 2019-07-20 LAB — CBC WITH DIFFERENTIAL/PLATELET
Abs Immature Granulocytes: 0.04 10*3/uL (ref 0.00–0.07)
Basophils Absolute: 0 10*3/uL (ref 0.0–0.1)
Basophils Relative: 0 %
Eosinophils Absolute: 0.3 10*3/uL (ref 0.0–0.5)
Eosinophils Relative: 3 %
HCT: 31.9 % — ABNORMAL LOW (ref 39.0–52.0)
Hemoglobin: 10.4 g/dL — ABNORMAL LOW (ref 13.0–17.0)
Immature Granulocytes: 1 %
Lymphocytes Relative: 21 %
Lymphs Abs: 1.6 10*3/uL (ref 0.7–4.0)
MCH: 29.5 pg (ref 26.0–34.0)
MCHC: 32.6 g/dL (ref 30.0–36.0)
MCV: 90.6 fL (ref 80.0–100.0)
Monocytes Absolute: 0.6 10*3/uL (ref 0.1–1.0)
Monocytes Relative: 8 %
Neutro Abs: 5.2 10*3/uL (ref 1.7–7.7)
Neutrophils Relative %: 67 %
Platelets: 274 10*3/uL (ref 150–400)
RBC: 3.52 MIL/uL — ABNORMAL LOW (ref 4.22–5.81)
RDW: 12.8 % (ref 11.5–15.5)
WBC: 7.8 10*3/uL (ref 4.0–10.5)
nRBC: 0 % (ref 0.0–0.2)

## 2019-07-20 LAB — COMPREHENSIVE METABOLIC PANEL
ALT: 18 U/L (ref 0–44)
AST: 26 U/L (ref 15–41)
Albumin: 2.8 g/dL — ABNORMAL LOW (ref 3.5–5.0)
Alkaline Phosphatase: 104 U/L (ref 38–126)
Anion gap: 9 (ref 5–15)
BUN: 16 mg/dL (ref 8–23)
CO2: 31 mmol/L (ref 22–32)
Calcium: 12.1 mg/dL — ABNORMAL HIGH (ref 8.9–10.3)
Chloride: 98 mmol/L (ref 98–111)
Creatinine, Ser: 1.25 mg/dL — ABNORMAL HIGH (ref 0.61–1.24)
GFR calc Af Amer: >60 mL/min (ref >60)
GFR calc non Af Amer: 56 mL/min — ABNORMAL LOW (ref >60)
Glucose, Bld: 117 mg/dL — ABNORMAL HIGH (ref 70–99)
Potassium: 3.5 mmol/L (ref 3.5–5.1)
Sodium: 138 mmol/L (ref 135–145)
Total Bilirubin: 0.5 mg/dL (ref 0.3–1.2)
Total Protein: 6.8 g/dL (ref 6.5–8.1)

## 2019-07-20 LAB — PHOSPHORUS: Phosphorus: 3.1 mg/dL (ref 2.5–4.6)

## 2019-07-20 LAB — MAGNESIUM: Magnesium: 1.9 mg/dL (ref 1.7–2.4)

## 2019-07-20 MED ORDER — CLOPIDOGREL BISULFATE 75 MG PO TABS: 75.0000 mg | ORAL_TABLET | Freq: Every day | ORAL | 0 refills | Status: DC

## 2019-07-20 MED ORDER — OXYCODONE HCL 5 MG PO TABS: 5.0000 mg | ORAL_TABLET | Freq: Four times a day (QID) | ORAL | 0 refills | Status: AC | PRN

## 2019-07-20 NOTE — Discharge Instructions (Signed)
Take both aspirin and plavix together for 3 weeks, then plavix alone afterwards

## 2019-07-20 NOTE — TOC Transition Note (Signed)
Transition of Care [facility]) - CM/SW Discharge Note   Patient Details  Name: NIAM NEPOMUCENO MRN: [id number] Date of Birth: 1945/02/15  [facility] CM/SW Contact:  Pollie Friar, RN Phone Number: 07/20/2019, 3:29 PM   Clinical Narrative:    Pt discharging to [facility] in [place]. CM has updated the son, Gerald Stabs. PTAR called and transportation arranged. D/c packet at the desk and bedside RN updated.   Room; 38 Number for report: [phone]   Final next level of care: Skilled Nursing Facility Barriers to Discharge: No Barriers Identified   Patient Goals and CMS Choice   CMS Medicare.gov Compare Post Acute Care list provided to:: Patient Choice offered to / list presented to : Patient, Adult Children  Discharge Placement              Patient chooses bed at: [facility] Patient to be transferred to facility by: [place] Name of family member notified: Gerald Stabs (son) Patient and family notified of of transfer: 07/20/19  Discharge Plan and Services In-house Referral: Clinical Social Work Discharge Planning Services: CM Consult Post Acute Care Choice: [place]                               Social Determinants of Health (SDOH) Interventions     Readmission Risk Interventions No flowsheet data found.

## 2019-07-20 NOTE — Progress Notes (Signed)
Report called to [facility] in [place], received by Rhunette Croft, RN.  Peripheral IV removed, telemetry discontinued.

## 2019-07-20 NOTE — Progress Notes (Signed)
Pt discharged with PTAR. No questions for the nurse.

## 2019-07-20 NOTE — Discharge Summary (Addendum)
Physician Discharge Summary  Kevin Perez [name] DUK:[id number] DOB: 08-10-1945 DOA: 07/16/2019  PCP: Kevin Olp, MD  Admit date: 07/16/2019 Discharge date: 07/20/2019  Admitted From: home Disposition:  SNF  Recommendations for Outpatient Follow-up:  1. Follow up with PCP in 1-2 weeks 2. Follow up with Neurology as scheduled 3. Per Neurology, continue both aspirin and plavix x 3 weeks, then afterwards, continue plavix alone 4. Follow up with Oncology as scheduled  Kevin Perez reviewed  Discharge Condition:Stable CODE STATUS:Full Diet recommendation: Heart healthy   Brief/Interim Summary: 74 y.o.malewith medical history significant forrecently diagnosed multiple myeloma, history of vasculitis, HLD, HTN and anxiety. Patient is a poor historian as he is a bit confused. Most of history obtained by chart review is supplemented by son at bedside. He stated that his dad was in his usual state of health until his brother caught him around 4:58 PM last night concerned that his father was complaining of trouble seeing as well as increased clumsiness stating he knocked down his drink at dinner twice which is unlike him. Per chart review by ED provider symptoms reportedly started 5 days prior to this admission with an acute change in his vision patient stating he cannot "seem to focus on anything" as well as endorsing intermittent headaches that occurred 24 hours ago.  Of note, patient and son report back surgery (fusion) for pathologic fracture about 4 to 5 weeks prior. Patient has been using a back brace as well as as needed use of oxycodone for pain control. His son reports he tends to get more confused than taking his pain medication ED Course:  Afebrile, respiratory rate 22, hemodynamically stable, blood pressure range 94/73-115/62. INR 1.1, UDS unremarkable SARS PCR negative. Sodium 134, creatinine 1.7, BUN 24, calcium 11.7. Hemoglobin 11.1. ESR 61. CRP 7.7.  Chest x-ray showed  cardiomegaly with possible atelectasis. MRI brain/orbit showed normal orbits but several punctate acute infarctions in the right cerebellar consistent with microembolic disease.  Neurology was consulted [facility] hospitalist service was called for further management and evaluation of patient with blurred vision found to have multiple acute infarcts in cerebellum.  Discharge Diagnoses:  Active Problems:   Hyperlipidemia   Essential hypertension   History of arteritis   GERD   Gout   BPH associated with nocturia   Multiple myeloma without remission (HCC)   CVA (cerebral vascular accident) ([place])   Hyponatremia   Lumbar compression fracture (HCC)   CKD (chronic kidney disease), stage III   Chronic diastolic CHF (congestive heart failure) ([place])   Blurry vision secondary to several punctate acute infarct in the right cerebellum, left occipital/parietal occipital lobe, concern for microembolic disease -Neurology following, recommend 30 day event monitoring -cont DAPT x 3 weeks then plavix alone afterwards -SNF pending -Family reports some improvement over the past 48-72hrs. Seems stable   Hypotension SBP's in the 90s to low 100s on presentation. Suspect related to diminished oral intake witnessed by family. -BP currently stable and had trended up some. Would avoid over-treating BP as per below  Remote history of (05/2007) arteritis. ESRANDCRP elevated. Blurry vision seems more consistent with CVA. Doubt temporal arteritis, no jaw claudication or temporal lobe tenderness. -Continue to monitor. Discussed with Neurology. Given concerns of intolerance to steroids in the past, would continue to hold off for now and recommend close outpatient follow up  HTN. Blood pressure actually little soft on admission with low of SBP in the 90s, currently 115 -Currently hold off on home losartan, HCTZ for now. May gradually  resume eventually as BP tolerates. Given recent CVA, would not aggressively  treat or over-treat BP  Multiple myeloma not having received remission with lytic bone lesions Newly diagnosed 06/2019. Bone marrow biopsy 6/15 confirmed plasma cell myeloma -followedoutpatientby Dr. Lorenso Perez (oncology), on Revlimid as outpatient. Per Oncology, recommendation to hold chemo -Seems to be stable currently  CKD stage IIIa, stable Creatinine stable at baseline of 1.2 -Avoid nephrotoxins -Remains stable and voiding well  GERD -continue PPI  Hyperlipidemia -Increase statin to high intensity dosing given CVA  Gout, stable -Continue allopurinol  Status post IR vertebral body augmentation for painful compression fracture at L4 using vertebroplasty technique (05/31/2019) Back pain stablecurrentlyLast ED for worsening back pain on 07/09/2019 -Neurosurgery recommended TLSO brace for comforton 07/09/19 given failed improvement in symptoms with kyphoplasty -Currently on oxycodone to 2.5 mg q6hPRN -Delirium protocol -Continue with bowel regimen  BPH, stable -Monitor output. Condom cath while in hospital -On Flomax  Constipation -Eventually very good results with bowel regimen   Discharge Instructions  Discharge Instructions    Ambulatory referral to Neurology   Complete by: As directed    Follow up with Dr. Leonie Perez at [facility] in 4-6 weeks. Too complicated for NP to follow. Thanks.     Allergies as of 07/20/2019      Reactions   Prednisone    Patient attributes suicide attempt to prednisone use.    Amoxicillin-pot Clavulanate Nausea And Vomiting   Ace Inhibitors    REACTION: cough   Codeine Phosphate    REACTION: vomiting      Medication List    STOP taking these medications   clonazePAM 1 MG tablet Commonly known as: KLONOPIN   indomethacin 25 MG capsule Commonly known as: INDOCIN   losartan 100 MG tablet Commonly known as: COZAAR   oxyCODONE-acetaminophen 7.5-325 MG tablet Commonly known as: Percocet     TAKE these medications    acetaminophen 500 MG tablet Commonly known as: TYLENOL Take 1,000 mg by mouth every 8 (eight) hours as needed for moderate pain.   allopurinol 100 MG tablet Commonly known as: ZYLOPRIM Take 1 tablet (100 mg total) by mouth daily.   aspirin EC 81 MG tablet Take 81 mg by mouth daily.   atorvastatin 40 MG tablet Commonly known as: LIPITOR Take 1 tablet (40 mg total) by mouth 3 (three) times a week.   calcium carbonate 500 MG chewable tablet Commonly known as: TUMS - dosed in mg elemental calcium Chew 1 tablet by mouth as needed for indigestion or heartburn.   clopidogrel 75 MG tablet Commonly known as: PLAVIX Take 1 tablet (75 mg total) by mouth daily. Start taking on: July 21, 2019   lenalidomide 10 MG capsule Commonly known as: REVLIMID Take 1 capsule (10 mg total) by mouth daily. Take for 14 days, then hold for 7 days. Repeat every 21 days.   mirtazapine 15 MG tablet Commonly known as: REMERON TAKE 1 TABLET BY MOUTH EVERYDAY AT BEDTIME What changed: See the new instructions.   oxyCODONE 5 MG immediate release tablet Commonly known as: Oxy IR/ROXICODONE Take 1 tablet (5 mg total) by mouth every 6 (six) hours as needed for severe pain.   pantoprazole 40 MG tablet Commonly known as: PROTONIX Take 1 tablet (40 mg total) by mouth daily.   sennosides-docusate sodium 8.6-50 MG tablet Commonly known as: SENOKOT-S Take 1 tablet by mouth daily as needed for constipation.   tamsulosin 0.4 MG Caps capsule Commonly known as: FLOMAX TAKE 1 CAPSULE BY MOUTH EVERY  DAY       Contact information for follow-up providers    Garvin Fila, MD. Schedule an appointment as soon as possible for a visit in 4 week(s).   Specialties: Neurology, Radiology Contact information: [address] [place] [address] [phone]            Contact information for after-discharge care    Destination    [facility] .    Service: Skilled Nursing Contact information: [address] [place] [phone]                 Allergies  Allergen Reactions  . Prednisone     Patient attributes suicide attempt to prednisone use.   Marland Kitchen Amoxicillin-Pot Clavulanate Nausea And Vomiting  . Ace Inhibitors     REACTION: cough  . Codeine Phosphate     REACTION: vomiting    Consultations:  Neurology  Procedures/Studies: DG Chest 2 View  Result Date: 07/16/2019 CLINICAL DATA:  CVA EXAM: CHEST - 2 VIEW COMPARISON:  05/01/2019 FINDINGS: Mild cardiomegaly. Areas of scarring in the left mid lung. Scarring or atelectasis in the lung bases. No effusions or acute bony abnormality. IMPRESSION: Cardiomegaly. Areas of scarring or atelectasis as above. No acute cardiopulmonary disease. Electronically Signed   By: Rolm Baptise M.D.   On: 07/16/2019 17:45   DG Lumbar Spine Complete  Result Date: 07/09/2019 CLINICAL DATA:  Worsening pain, multiple myeloma EXAM: LUMBAR SPINE - COMPLETE 4+ VIEW COMPARISON:  06/06/2019 FINDINGS: Severe osteopenia. Chronic L4 vertebral body compression fracture with prior augmentation and approximately 70% height loss. 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 Severe L1 vertebral body compression fracture with increased height loss compared with 06/06/2019 likely reflecting a acute-subacute fracture superimposed upon a chronic fracture with currently approximately 80% height loss. Degenerative disease with severe disc height loss and endplate sclerosis at I2-M3. Diffuse bilateral facet arthropathy of the lumbar spine. IMPRESSION: 1. Severe L1 vertebral body compression fracture with increased height loss compared with 06/06/2019 likely reflecting an acute-subacute fracture superimposed upon a chronic fracture with currently approximately 80% height loss. 2. 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 Electronically Signed   By: Kathreen Devoid   On: 07/09/2019 11:17   MR ANGIO HEAD WO CONTRAST  Result Date: 07/16/2019 CLINICAL DATA:  Follow-up examination for posterior circulation infarcts. EXAM: MRA HEAD WITHOUT CONTRAST TECHNIQUE: Angiographic images of the Circle of Willis were obtained using MRA technique without intravenous contrast. COMPARISON:  Prior brain MRI from earlier the same day. FINDINGS: ANTERIOR CIRCULATION: Examination degraded by motion artifact. Visualized distal cervical segments of the internal carotid arteries are widely patent with symmetric antegrade flow. Petrous, cavernous, and supraclinoid ICAs patent without appreciable stenosis or other abnormality. A1 segments patent bilaterally. Grossly normal anterior communicating artery complex. Anterior cerebral arteries patent to their distal aspects without appreciable stenosis. No M1 stenosis or occlusion. Negative MCA bifurcations. Distal MCA branches well perfused and symmetric. POSTERIOR CIRCULATION: Vertebral arteries widely patent to the vertebrobasilar junction without stenosis. Right vertebral artery dominant. Both picas patent. Basilar widely patent to its distal aspect without stenosis. Superior cerebral arteries patent bilaterally. Both PCAs primarily supplied via the basilar and are well perfused to their distal aspects without stenosis. No intracranial aneurysm or other vascular abnormality. IMPRESSION: Negative intracranial MRA. No large vessel occlusion. No hemodynamically significant or correctable stenosis. Electronically Signed   By: Pincus Badder.D.  On: 07/16/2019 22:15   MR Brain W and Wo Contrast  Result Date: 07/16/2019 CLINICAL DATA:  Vision loss. 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 History of multiple myeloma. EXAM: MRI HEAD AND ORBITS WITHOUT AND WITH CONTRAST TECHNIQUE: Multiplanar, multiecho pulse sequences of the brain and surrounding structures were obtained without and with intravenous contrast. Multiplanar,  multiecho pulse sequences of the orbits and surrounding structures were obtained including fat saturation techniques, before and after intravenous contrast administration. CONTRAST:  24m GADAVIST GADOBUTROL 1 MMOL/ML IV SOLN COMPARISON:  Head CT 07/26/1998 FINDINGS: MRI HEAD FINDINGS Brain: Diffusion imaging shows a punctate acute infarction within the right superior cerebellum. Punctate acute infarction present in the left occipital cortex and at the left parietooccipital junction region. No large confluent infarction. Elsewhere, brain shows mild chronic small-vessel disease of hemispheric white matter. No mass, hemorrhage, hydrocephalus or extra-axial collection. After contrast administration, no abnormal enhancement occurs. Vascular: Major vessels at the base of the brain show flow. Skull and upper cervical spine: Negative Other: None MRI ORBITS FINDINGS Orbits: Both globes appear normal. Both optic nerves are normal. Orbital fat is normal. Extra-ocular muscles are normal. No abnormal contrast enhancement. Visualized sinuses: Normal Soft tissues: Normal Limited intracranial: See results of brain MRI. IMPRESSION: MRI orbits: Normal MRI head: Several punctate acute infarctions including 1 in the right cerebellum and 2 to 4 in the left occipital and parietooccipital junction region. 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 No large confluent infarction. No mass effect or hemorrhage. Mild chronic small-vessel ischemic change elsewhere affecting the cerebral hemispheric white matter Electronically Signed   By: MNelson ChimesM.D.   On: 07/16/2019 15:00   DG Bone Survey Met  Result Date: 07/04/2019 CLINICAL DATA:  Lytic lesions on x-ray EXAM: METASTATIC BONE SURVEY COMPARISON:  CT chest abdomen pelvis dated 06/19/2019 FINDINGS: A few tiny sub 5 mm lytic lesions in the skull are suspected. Permeative moth-eaten lytic appearance involving the bilateral humeri, without discrete focal  lytic lesions. Bilateral upper extremities are otherwise unremarkable. Known dominant lytic lesion involving the left posterior 9th rib on CT is not radiographically evident. However, there are nondisplaced fractures involving the left lateral 3rd and 4th ribs No definite lytic lesions in the visualized cervical, thoracic, or lumbar spine. Moderate compression fracture deformities at T12-L2. Prior vertebral augmentation at L4. Subcentimeter lytic lesion in the right mid femur. Permeative mild the lytic appearance involving the left femur. Bilateral lower extremities are otherwise unremarkable. IMPRESSION: A few tiny sub 5 mm lytic lesions in the skull and a single subcentimeter lytic lesion in the right mid femur. Known dominant lytic lesion involving the left posterior 9th rib on CT is not radiographically evident. Nondisplaced fractures involving the left lateral 3rd and 4th ribs. Permeative, moth-eaten lytic appearance involving the bilateral humeri and left femur. Moderate compression fracture deformities at T12-L2. Prior vertebral augmentation at L4. Electronically Signed   By: SJulian HyM.D.   On: 07/04/2019 16:39   ECHOCARDIOGRAM COMPLETE  Result Date: 07/17/2019    ECHOCARDIOGRAM REPORT   Patient Name:   JLYON DUMONTDate of Exam: 07/17/2019 Medical Rec #:  [id number]       Height:       66.0 in Accession #:    [id number]      Weight:       118.8 lb Date of Birth:  [birth year]        BSA:  1.603 m Patient Age:    54 years         BP:           126/65 mmHg Patient Gender: M                HR:           72 bpm. Exam Location:  Inpatient Procedure: 2D Echo Indications:    Stroke 434.91 / I163.9  History:        Patient has no prior history of Echocardiogram examinations.                 Risk Factors:Hypertension and Dyslipidemia. Multiple myeloma,                 Raynaud's syndrome.  Sonographer:    Darlina Sicilian RDCS Referring Phys: [id number] [facility] D NETTEY  Sonographer Comments: Patient  is restless and unoriented making exam very difficult IMPRESSIONS  1. Left ventricular ejection fraction, by estimation, is 60 to 65%. The left ventricle has normal function. The left ventricle has no regional wall motion abnormalities. Left ventricular diastolic parameters are consistent with Grade I diastolic dysfunction (impaired relaxation).  2. Right ventricular systolic function is normal. The right ventricular size is normal.  3. The mitral valve is normal in structure. No evidence of mitral valve regurgitation. No evidence of mitral stenosis.  4. The aortic valve is tricuspid. Aortic valve regurgitation is not visualized. Mild to moderate aortic valve sclerosis/calcification is present, without any evidence of aortic stenosis.  5. The inferior vena cava is normal in size with greater than 50% respiratory variability, suggesting right atrial pressure of 3 mmHg. FINDINGS  Left Ventricle: Left ventricular ejection fraction, by estimation, is 60 to 65%. The left ventricle has normal function. The left ventricle has no regional wall motion abnormalities. The left ventricular internal cavity size was normal in size. There is  no left ventricular hypertrophy. Left ventricular diastolic parameters are consistent with Grade I diastolic dysfunction (impaired relaxation). Normal left ventricular filling pressure. Right Ventricle: The right ventricular size is normal. No increase in right ventricular wall thickness. Right ventricular systolic function is normal. Left Atrium: Left atrial size was normal in size. Right Atrium: Right atrial size was normal in size. Pericardium: There is no evidence of pericardial effusion. Mitral Valve: The mitral valve is normal in structure. Normal mobility of the mitral valve leaflets. No evidence of mitral valve regurgitation. No evidence of mitral valve stenosis. Tricuspid Valve: The tricuspid valve is normal in structure. Tricuspid valve regurgitation is not demonstrated. No evidence  of tricuspid stenosis. Aortic Valve: The aortic valve is tricuspid. Aortic valve regurgitation is not visualized. Mild to moderate aortic valve sclerosis/calcification is present, without any evidence of aortic stenosis. Pulmonic Valve: The pulmonic valve was normal in structure. Pulmonic valve regurgitation is not visualized. No evidence of pulmonic stenosis. Aorta: The aortic root is normal in size and structure. Venous: The inferior vena cava is normal in size with greater than 50% respiratory variability, suggesting right atrial pressure of 3 mmHg. IAS/Shunts: No atrial level shunt detected by color flow Doppler.  LEFT VENTRICLE PLAX 2D LVIDd:         3.60 cm  Diastology LVIDs:         2.70 cm  LV e' lateral:   9.36 cm/s LV PW:         1.10 cm  LV E/e' lateral: 8.6 LV IVS:        1.00 cm  LV e' medial:    6.31 cm/s LVOT diam:     2.40 cm  LV E/e' medial:  12.8 LVOT Area:     4.52 cm  RIGHT VENTRICLE RV S prime:     11.70 cm/s TAPSE (M-mode): 1.6 cm LEFT ATRIUM           Index LA diam:      2.70 cm 1.68 cm/m LA Vol (A4C): 26.0 ml 16.22 ml/m   AORTA Ao Root diam: 3.30 cm MITRAL VALVE MV Area (PHT): 3.77 cm     SHUNTS MV Decel Time: 201 msec     Systemic Diam: 2.40 cm MV E velocity: 80.60 cm/s MV A velocity: 101.00 cm/s MV E/A ratio:  0.80 Mihai Croitoru MD Electronically signed by Sanda Klein MD Signature Date/Time: 07/17/2019/11:09:10 AM    Final    VAS US CAROTID (at [facility] and [facility] only)  Result Date: 07/18/2019 Carotid Arterial Duplex Study Indications:   CVA. Risk Factors:  Hypertension, hyperlipidemia. Other Factors: Several punctate acute infarctions. Performing Technologist: June Leap RDMS, RVT  Examination Guidelines: A complete evaluation includes B-mode imaging, spectral Doppler, color Doppler, and power Doppler as needed of all accessible portions of each vessel. Bilateral testing is considered an integral part of a complete examination. Limited examinations for reoccurring indications may be  performed as noted.  Right Carotid Findings: +----------+--------+--------+--------+------------------+--------+           PSV cm/sEDV cm/sStenosisPlaque DescriptionComments +----------+--------+--------+--------+------------------+--------+ CCA Prox  86      15                                         +----------+--------+--------+--------+------------------+--------+ CCA Distal77      15                                         +----------+--------+--------+--------+------------------+--------+ ICA Prox  73      21      1-39%   heterogenous               +----------+--------+--------+--------+------------------+--------+ ICA Distal98      24                                         +----------+--------+--------+--------+------------------+--------+ ECA       104     11                                         +----------+--------+--------+--------+------------------+--------+ +----------+--------+-------+----------------+-------------------+           PSV cm/sEDV cmsDescribe        Arm Pressure (mmHG) +----------+--------+-------+----------------+-------------------+ Subclavian119            Multiphasic, WNL                    +----------+--------+-------+----------------+-------------------+ +---------+--------+--+--------+--+---------+ VertebralPSV cm/s50EDV cm/s14Antegrade +---------+--------+--+--------+--+---------+  Left Carotid Findings: +----------+--------+--------+--------+------------------+--------+           PSV cm/sEDV cm/sStenosisPlaque DescriptionComments +----------+--------+--------+--------+------------------+--------+ CCA Prox  130     20                                         +----------+--------+--------+--------+------------------+--------+  CCA Distal114     17                                         +----------+--------+--------+--------+------------------+--------+ ICA Prox  99      25      1-39%    heterogenous               +----------+--------+--------+--------+------------------+--------+ ICA Distal89      20                                         +----------+--------+--------+--------+------------------+--------+ ECA       102     11                                         +----------+--------+--------+--------+------------------+--------+ +----------+--------+--------+----------------+-------------------+           PSV cm/sEDV cm/sDescribe        Arm Pressure (mmHG) +----------+--------+--------+----------------+-------------------+ [id number]              Multiphasic, WNL                    +----------+--------+--------+----------------+-------------------+ +---------+--------+--+--------+--+---------+ VertebralPSV cm/s48EDV cm/s12Antegrade +---------+--------+--+--------+--+---------+   Summary: Right Carotid: Velocities in the right ICA are consistent with a 1-39% stenosis. Left Carotid: Velocities in the left ICA are consistent with a 1-39% stenosis. Vertebrals:  Bilateral vertebral arteries demonstrate antegrade flow. Subclavians: Normal flow hemodynamics were seen in bilateral subclavian              arteries. *See table(s) above for measurements and observations.  Electronically signed by Antony Contras MD on 07/18/2019 at 9:33:43 AM.    Final    MR ORBITS W WO CONTRAST  Result Date: 07/16/2019 CLINICAL DATA:  Vision loss. 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 History of multiple myeloma. EXAM: MRI HEAD AND ORBITS WITHOUT AND WITH CONTRAST TECHNIQUE: Multiplanar, multiecho pulse sequences of the brain and surrounding structures were obtained without and with intravenous contrast. Multiplanar, multiecho pulse sequences of the orbits and surrounding structures were obtained including fat saturation techniques, before and after intravenous contrast administration. CONTRAST:  41m GADAVIST GADOBUTROL 1 MMOL/ML IV SOLN COMPARISON:  Head CT  07/26/1998 FINDINGS: MRI HEAD FINDINGS Brain: Diffusion imaging shows a punctate acute infarction within the right superior cerebellum. Punctate acute infarction present in the left occipital cortex and at the left parietooccipital junction region. No large confluent infarction. Elsewhere, brain shows mild chronic small-vessel disease of hemispheric white matter. No mass, hemorrhage, hydrocephalus or extra-axial collection. After contrast administration, no abnormal enhancement occurs. Vascular: Major vessels at the base of the brain show flow. Skull and upper cervical spine: Negative Other: None MRI ORBITS FINDINGS Orbits: Both globes appear normal. Both optic nerves are normal. Orbital fat is normal. Extra-ocular muscles are normal. No abnormal contrast enhancement. Visualized sinuses: Normal Soft tissues: Normal Limited intracranial: See results of brain MRI. IMPRESSION: MRI orbits: Normal MRI head: Several punctate acute infarctions including 1 in the right cerebellum and 2 to 4 in the left occipital and parietooccipital junction region. 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 No large confluent infarction. No mass effect or hemorrhage. Mild chronic small-vessel ischemic change elsewhere  affecting the cerebral hemispheric white matter Electronically Signed   By: Nelson Chimes M.D.   On: 07/16/2019 15:00     Subjective: Eager to go to rehab  Discharge Exam: Vitals:   07/20/19 0700 07/20/19 0828  BP:  (!) 155/80  Pulse:  88  Resp: 16 16  Temp:  (!) 97.5 F (36.4 C)  SpO2:  99%   Vitals:   07/20/19 0500 07/20/19 0600 07/20/19 0700 07/20/19 0828  BP:    (!) 155/80  Pulse:    88  Resp: _0 Temp:    (!) 97.5 F (36.4 C)  TempSrc:    Oral  SpO2:    99%  Weight: 54.9 kg     Height:        General: Pt is alert, awake, not in acute distress Cardiovascular: RRR, S1/S2 +, no rubs, no gallops Respiratory: CTA bilaterally, no wheezing, no  rhonchi Abdominal: Soft, NT, ND, bowel sounds + Extremities: no edema, no cyanosis   The results of significant diagnostics from this hospitalization (including imaging, microbiology, ancillary and laboratory) are listed below for reference.     Microbiology: Recent Results (from the past 240 hour(s))  SARS Coronavirus 2 by RT PCR (hospital order, performed in [facility] hospital lab) Nasopharyngeal Nasopharyngeal Swab     Status: None   Collection Time: 07/16/19  3:16 PM   Specimen: Nasopharyngeal Swab  Result Value Ref Range Status   SARS Coronavirus 2 NEGATIVE NEGATIVE Final    Comment: (NOTE) SARS-CoV-2 target nucleic acids are NOT DETECTED.  The SARS-CoV-2 RNA is generally detectable in upper and lower respiratory specimens during the acute phase of infection. The lowest concentration of SARS-CoV-2 viral copies this assay can detect is 250 copies / mL. A negative result does not preclude SARS-CoV-2 infection and should not be used as the sole basis for treatment or other patient management decisions.  A negative result may occur with improper specimen collection / handling, submission of specimen other than nasopharyngeal swab, presence of viral mutation(s) within the areas targeted by this assay, and inadequate number of viral copies (<250 copies / mL). A negative result must be combined with clinical observations, patient history, and epidemiological information.  Fact Sheet for Patients:   StrictlyIdeas.no  Fact Sheet for Healthcare Providers: BankingDealers.co.za  This test is not yet approved or  cleared by the [place] FDA and has been authorized for detection and/or diagnosis of SARS-CoV-2 by FDA under an Emergency Use Authorization (EUA).  This EUA will remain in effect (meaning this test can be used) for the duration of the COVID-19 declaration under Section 564(b)(1) of the Act, 21 U.S.C. section  360bbb-3(b)(1), unless the authorization is terminated or revoked sooner.  Performed at [facility], [place] [address] [place], [place] [address]   Culture, Urine     Status: Abnormal   Collection Time: 07/16/19  5:04 PM   Specimen: Urine, Random  Result Value Ref Range Status   Specimen Description   Final    URINE, RANDOM Performed at [facility] [address], [place], [place] [address]    Special Requests   Final    NONE Performed at [facility], [place] [address], [place], [place] [address]    Culture (A)  Final    <10,000 COLONIES/mL INSIGNIFICANT GROWTH Performed at [facility] [address], [place], [place] [address]    Report Status 07/18/2019 FINAL  Final  Labs: BNP (last 3 results) No results for input(s): BNP in the last 8760 hours. Basic Metabolic Panel: Recent Labs  Lab 07/16/19 1040 07/17/19 0319 07/18/19 0409 07/19/19 0312 07/20/19 0525  NA 134* 136 137 139 138  K 3.6 3.2* 3.1* 3.5 3.5  CL 93* 99 99 100 98  CO2 32 _0 GLUCOSE 130* 101* 108* 109* 117*  BUN 24* _1 CREATININE 1.27* 1.16 1.03 1.13 1.25*  CALCIUM 11.7* 11.6* 11.6* 11.7* 12.1*  MG  --   --  1.7 1.8 1.9  PHOS  --   --  3.1 3.0 3.1   Liver Function Tests: Recent Labs  Lab 07/16/19 1040 07/18/19 0409 07/19/19 0312 07/20/19 0525  AST _2 ALT _3 ALKPHOS 128* 110 106 104  BILITOT 0.6 0.3 0.6 0.5  PROT 8.0 7.2 6.7 6.8  ALBUMIN 3.6 3.0* 3.0* 2.8*   No results for input(s): LIPASE, AMYLASE in the last 168 hours. No results for input(s): AMMONIA in the last 168 hours. CBC: Recent Labs  Lab 07/16/19 1040 07/17/19 0319 07/18/19 0409 07/19/19 0312 07/20/19 0525  WBC 8.1 6.9 5.7 6.0 7.8  NEUTROABS 6.5  --  3.4 3.1 5.2  HGB 11.1* 9.8* 11.3* 11.3* 10.4*  HCT 32.9* 29.9* 34.7* 34.0* 31.9*  MCV 91.6 91.4 91.3 91.2 90.6  PLT 236 210 246 281 274   Cardiac Enzymes: No results  for input(s): CKTOTAL, CKMB, CKMBINDEX, TROPONINI in the last 168 hours. BNP: Invalid input(s): POCBNP CBG: No results for input(s): GLUCAP in the last 168 hours. D-Dimer No results for input(s): DDIMER in the last 72 hours. Hgb A1c Recent Labs    07/18/19 0409  HGBA1C 5.5   Lipid Profile Recent Labs    07/18/19 0409  CHOL 118  HDL 30*  LDLCALC 67  TRIG 103  CHOLHDL 3.9   Thyroid function studies No results for input(s): TSH, T4TOTAL, T3FREE, THYROIDAB in the last 72 hours.  Invalid input(s): FREET3 Anemia work up No results for input(s): VITAMINB12, FOLATE, FERRITIN, TIBC, IRON, RETICCTPCT in the last 72 hours. Urinalysis    Component Value Date/Time   COLORURINE YELLOW 07/16/2019 1040   APPEARANCEUR CLEAR 07/16/2019 1040   LABSPEC 1.011 07/16/2019 1040   PHURINE 6.0 07/16/2019 1040   GLUCOSEU NEGATIVE 07/16/2019 1040   GLUCOSEU NEGATIVE 02/15/2018 1133   HGBUR NEGATIVE 07/16/2019 1040   HGBUR negative 06/10/2009 0826   BILIRUBINUR NEGATIVE 07/16/2019 1040   BILIRUBINUR Negative 05/01/2019 1450   KETONESUR NEGATIVE 07/16/2019 1040   PROTEINUR NEGATIVE 07/16/2019 1040   UROBILINOGEN 0.2 05/01/2019 1450   UROBILINOGEN 0.2 02/15/2018 1133   NITRITE NEGATIVE 07/16/2019 1040   LEUKOCYTESUR LARGE (A) 07/16/2019 1040   Sepsis Labs Invalid input(s): PROCALCITONIN,  WBC,  LACTICIDVEN Microbiology Recent Results (from the past 240 hour(s))  SARS Coronavirus 2 by RT PCR (hospital order, performed in [facility] hospital lab) Nasopharyngeal Nasopharyngeal Swab     Status: None   Collection Time: 07/16/19  3:16 PM   Specimen: Nasopharyngeal Swab  Result Value Ref Range Status   SARS Coronavirus 2 NEGATIVE NEGATIVE Final    Comment: (NOTE) SARS-CoV-2 target nucleic acids are NOT DETECTED.  The SARS-CoV-2 RNA is generally detectable in upper and lower respiratory specimens during the acute phase of infection. The lowest concentration of SARS-CoV-2 viral copies this  assay can detect is 250 copies / mL. A negative result does not preclude SARS-CoV-2 infection and should not  be used as the sole basis for treatment or other patient management decisions.  A negative result may occur with improper specimen collection / handling, submission of specimen other than nasopharyngeal swab, presence of viral mutation(s) within the areas targeted by this assay, and inadequate number of viral copies (<250 copies / mL). A negative result must be combined with clinical observations, patient history, and epidemiological information.  Fact Sheet for Patients:   StrictlyIdeas.no  Fact Sheet for Healthcare Providers: BankingDealers.co.za  This test is not yet approved or  cleared by the [place] FDA and has been authorized for detection and/or diagnosis of SARS-CoV-2 by FDA under an Emergency Use Authorization (EUA).  This EUA will remain in effect (meaning this test can be used) for the duration of the COVID-19 declaration under Section 564(b)(1) of the Act, 21 U.S.C. section 360bbb-3(b)(1), unless the authorization is terminated or revoked sooner.  Performed at [facility], [place] [address] [place], [place] [address]   Culture, Urine     Status: Abnormal   Collection Time: 07/16/19  5:04 PM   Specimen: Urine, Random  Result Value Ref Range Status   Specimen Description   Final    URINE, RANDOM Performed at [facility] [address], [place], [place] [address]    Special Requests   Final    NONE Performed at [facility], [place] [address] [place], [place] [address]    Culture (A)  Final    <10,000 COLONIES/mL INSIGNIFICANT GROWTH Performed at [facility] [address], [place], [place] [address]    Report Status 07/18/2019 FINAL  Final   Time spent:30 min  SIGNED:   Marylu Lund, MD  [facility] 07/20/2019, 2:34 PM  If  7PM-7AM, please contact night-coverage

## 2019-07-20 NOTE — Progress Notes (Signed)
  Speech Language Pathology Treatment: Cognitive-Linquistic  Patient Details Name: Kevin Perez MRN: [id number] DOB: July 03, 1945 Today's Date: 07/20/2019 Time: 1205-1230 SLP Time Calculation (min) (ACUTE ONLY): 25 min  Assessment / Plan / Recommendation Clinical Impression  Pt was seen for cognitive-linguistic treatment. He was alert and cooperative during the session. He demonstrated 80% accuracy with a mental manipulation sequencing task increasing to 100% with cues. He was educated regarding use of memory aids and verbalized understanding. He achieved 50% accuracy with recall of concrete information from recorded voice mails increasing to 100% with verbal prompts. He completed a medication management (prescription) task with 60% accuracy increasing to 100% with prompting. At the end of the session pt expressed that he enjoyed it and that it was "just like a game show". He was advised that continued SLP services are recommended following discharge and he verbalized agreement with this. SLP will continue to follow pt.    HPI HPI: Kevin Perez is an 74 y.o. male with multiple myeloma, HLD, HTN, severe low back pain, cataracts and vasculitis, who presented to the [facility] ED this morning with a chief complaint of binocular "fuzzy vision" since beginning 5 days ago. He did not have any associated weakness. He has had some associated trouble with speech.  Does not endorse myalgias. No dysphagia.  MRI brain obtained in the ED revealed several punctate acute infarctions including 1 in theright cerebellum and 2 to 4 in the left occipital and parietooccipital junction region. 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 No large confluent infarction. No mass effect or hemorrhage. 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 Hardin Negus, [place], [place] Office number [phone] Pager [place] 07/20/2019, 2:09 PM

## 2019-07-24 ENCOUNTER — Other Ambulatory Visit: Payer: Self-pay | Admitting: *Deleted

## 2019-07-24 NOTE — Patient Outreach (Signed)
Screened for potential [facility] Care Management needs as a benefit of  [employer].  Mr. Garcialopez is currently receiving skilled therapy at [facility]) SNF. Member is s/p kyphoplasty.   Writer attended telephonic interdisciplinary team meeting to assess for disposition needs and transition plan for resident.   Facility reports member is from home alone. He was independent prior and still drove. He continues with cancer treatment. Goal is to return home as independent as possible per family request.   Will continue to follow and plan outreach to discuss [place] Management accordingly.   Marthenia Rolling, MSN-Ed, RN,BSN [facility] Acute Care Coordinator [phone] [facility]) [phone]  (Toll free office)

## 2019-07-25 ENCOUNTER — Other Ambulatory Visit: Payer: Self-pay | Admitting: Hematology and Oncology

## 2019-07-25 DIAGNOSIS — C9000 Multiple myeloma not having achieved remission: Secondary | ICD-10-CM

## 2019-07-26 ENCOUNTER — Inpatient Hospital Stay: Payer: No Typology Code available for payment source

## 2019-07-26 ENCOUNTER — Telehealth: Payer: Self-pay | Admitting: *Deleted

## 2019-07-26 ENCOUNTER — Encounter: Payer: Self-pay | Admitting: Hematology and Oncology

## 2019-07-26 ENCOUNTER — Other Ambulatory Visit: Payer: Self-pay

## 2019-07-26 ENCOUNTER — Inpatient Hospital Stay
Payer: No Typology Code available for payment source | Attending: Hematology and Oncology | Admitting: Hematology and Oncology

## 2019-07-26 VITALS — BP 170/93 | HR 81 | Temp 98.1°F | Resp 18 | Ht 66.0 in | Wt 121.5 lb

## 2019-07-26 DIAGNOSIS — R1310 Dysphagia, unspecified: Secondary | ICD-10-CM

## 2019-07-26 DIAGNOSIS — I639 Cerebral infarction, unspecified: Secondary | ICD-10-CM | POA: Insufficient documentation

## 2019-07-26 DIAGNOSIS — M25512 Pain in left shoulder: Secondary | ICD-10-CM | POA: Diagnosis not present

## 2019-07-26 DIAGNOSIS — Z87891 Personal history of nicotine dependence: Secondary | ICD-10-CM | POA: Insufficient documentation

## 2019-07-26 DIAGNOSIS — Z8659 Personal history of other mental and behavioral disorders: Secondary | ICD-10-CM | POA: Insufficient documentation

## 2019-07-26 DIAGNOSIS — Z5112 Encounter for antineoplastic immunotherapy: Secondary | ICD-10-CM | POA: Insufficient documentation

## 2019-07-26 DIAGNOSIS — C9000 Multiple myeloma not having achieved remission: Secondary | ICD-10-CM | POA: Diagnosis not present

## 2019-07-26 DIAGNOSIS — M25511 Pain in right shoulder: Secondary | ICD-10-CM | POA: Insufficient documentation

## 2019-07-26 LAB — CBC WITH DIFFERENTIAL (CANCER CENTER ONLY)
Abs Immature Granulocytes: 0.09 10*3/uL — ABNORMAL HIGH (ref 0.00–0.07)
Basophils Absolute: 0 10*3/uL (ref 0.0–0.1)
Basophils Relative: 0 %
Eosinophils Absolute: 0.1 10*3/uL (ref 0.0–0.5)
Eosinophils Relative: 0 %
HCT: 33.7 % — ABNORMAL LOW (ref 39.0–52.0)
Hemoglobin: 11 g/dL — ABNORMAL LOW (ref 13.0–17.0)
Immature Granulocytes: 1 %
Lymphocytes Relative: 7 %
Lymphs Abs: 0.9 10*3/uL (ref 0.7–4.0)
MCH: 30.4 pg (ref 26.0–34.0)
MCHC: 32.6 g/dL (ref 30.0–36.0)
MCV: 93.1 fL (ref 80.0–100.0)
Monocytes Absolute: 0.7 10*3/uL (ref 0.1–1.0)
Monocytes Relative: 5 %
Neutro Abs: 11.4 10*3/uL — ABNORMAL HIGH (ref 1.7–7.7)
Neutrophils Relative %: 87 %
Platelet Count: 362 10*3/uL (ref 150–400)
RBC: 3.62 MIL/uL — ABNORMAL LOW (ref 4.22–5.81)
RDW: 12.9 % (ref 11.5–15.5)
WBC Count: 13.1 10*3/uL — ABNORMAL HIGH (ref 4.0–10.5)
nRBC: 0 % (ref 0.0–0.2)

## 2019-07-26 LAB — CMP (CANCER CENTER ONLY)
ALT: 32 U/L (ref 0–44)
AST: 22 U/L (ref 15–41)
Albumin: 3.2 g/dL — ABNORMAL LOW (ref 3.5–5.0)
Alkaline Phosphatase: 163 U/L — ABNORMAL HIGH (ref 38–126)
Anion gap: 11 (ref 5–15)
BUN: 43 mg/dL — ABNORMAL HIGH (ref 8–23)
CO2: 30 mmol/L (ref 22–32)
Calcium: 13.4 mg/dL (ref 8.9–10.3)
Chloride: 94 mmol/L — ABNORMAL LOW (ref 98–111)
Creatinine: 1.75 mg/dL — ABNORMAL HIGH (ref 0.61–1.24)
GFR, Est AFR Am: 43 mL/min — ABNORMAL LOW (ref >60)
GFR, Estimated: 38 mL/min — ABNORMAL LOW (ref >60)
Glucose, Bld: 150 mg/dL — ABNORMAL HIGH (ref 70–99)
Potassium: 3.8 mmol/L (ref 3.5–5.1)
Sodium: 135 mmol/L (ref 135–145)
Total Bilirubin: 0.3 mg/dL (ref 0.3–1.2)
Total Protein: 8.1 g/dL (ref 6.5–8.1)

## 2019-07-26 MED ORDER — SODIUM CHLORIDE 0.9 % IV SOLN
INTRAVENOUS | Status: AC
  Filled 2019-07-26: qty 250

## 2019-07-26 MED ORDER — ZOLEDRONIC ACID 4 MG/5ML IV CONC: 3.0000 mg | Freq: Once | INTRAVENOUS | Status: DC

## 2019-07-26 MED ORDER — SODIUM CHLORIDE 0.9 % IV SOLN
Freq: Once | INTRAVENOUS | Status: DC
  Filled 2019-07-26: qty 250

## 2019-07-26 MED ORDER — ZOLEDRONIC ACID 4 MG/100ML IV SOLN
INTRAVENOUS | Status: AC
  Filled 2019-07-26: qty 100

## 2019-07-26 MED ORDER — ACYCLOVIR 400 MG PO TABS: 400.0000 mg | ORAL_TABLET | Freq: Every day | ORAL | 1 refills | Status: AC

## 2019-07-26 MED ORDER — ZOLEDRONIC ACID 4 MG/100ML IV SOLN
4.0000 mg | Freq: Once | INTRAVENOUS | Status: AC
  Administered 2019-07-26: 4 mg via INTRAVENOUS

## 2019-07-26 NOTE — Progress Notes (Signed)
MD Lorenso Courier would like Zometa dose to be 4 mg.  Larene Beach, PharmD

## 2019-07-26 NOTE — Progress Notes (Signed)
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 Moderately severe compression fracture of L4 with bone month edema consistent with a more recent fracture. 3) 05/31/2019: patient underwent IR vertebral body augmentation for painful compression fracture at L4 using vertebroplasty technique 4) 06/20/2019: CT C/A/P demonstrated diffuse, heterogeneously lytic, moth-eaten appearance of the osseous structures, most consistent with multiple myeloma. SPEP showed M protein 1.5, Kapp 29.9, Lambda 16.1, Ratio 1.86.  5) 06/25/2019: establish care with Dr. Lorenso Courier  6) 07/03/2019: Bone marrow biopsy confirmed plasma cell myeloma with 30% lambda restricted plasma cells.  7) 07/09/2019: presented for discussion of diagnosis/treatment. Taken to ED for severe 10/10 worsening back pain.  8) 07/16/2019: admitted to [facility] hospital for a CVA, MRI showing several punctate acute infarctions including 1 in the right cerebellum and 2 to 4 in the left occipital and parietooccipital junction region  Interval History:  Kevin Perez 74 y.o. male with medical history significant for newly diagnosed IgG lambda MM who presents for a follow up visit. The patient's last visit was on 07/09/2019 at which time we discussed the diagnosis. In the interim since the last visit the patient was taken the ED with severe back pain and subsequently admitted to [facility] with a CVA on 07/16/2019.   On exam today Kevin Perez notes that he is much improved from his last visit.  He reports that his pain is currently 4 out of 10 in  severity and that the brace is helping considerably.  He is concerned about some wheezing that he is having as well as difficulty swallowing.  He reports that these are relatively chronic issues that are recently worsened with the administration of the back brace.  He is also had some issues with constipation and mild confusion since her last visit.  He is currently at a SNF following his CVA but is soon up for review for potential discharge.  Kevin Perez denies having any fevers, chills, sweats, nausea, vomiting or diarrhea. He notes no residual deficits from his stroke. He is concerned about difficultly swallowing.  A full 10 point ROS is listed below.  MEDICAL HISTORY:  Past Medical History:  Diagnosis Date  . Cancer ([place])   . GERD 07/21/2006  . Gout   . HYPERLIPIDEMIA 06/17/2009  . HYPERTENSION 07/21/2006  . Raynaud's syndrome 07/21/2006  . Suicide attempt [facility])    Due to Prednisone!  Marland Kitchen VASCULITIS 05/23/2007    SURGICAL HISTORY: Past Surgical History:  Procedure Laterality Date  . HERNIA REPAIR  02/23/69   umbilical hernia  . INGUINAL HERNIA REPAIR     Bil  . IR VERTEBROPLASTY LUMBAR BX INC UNI/BIL INC/INJECT/IMAGING  05/31/2019    SOCIAL HISTORY: Social History   Socioeconomic History  . Marital status: Married    Spouse name: Not on file  . Number of children: 2  . Years of education: Not on file  . Highest education level: Not on file  Occupational History  . Occupation: Retired   Tobacco Use  . Smoking status: Former Smoker    Packs/day: 0.05    Years: 20.00    Pack years: 1.00  Types: Cigarettes  . Smokeless tobacco: Former Systems developer    Types: Calcium date: 06/26/1989  . Tobacco comment: chewed tobacco until 89 more than he smoked   Vaping Use  . Vaping Use: Never used  Substance and Sexual Activity  . Alcohol use: Yes    Alcohol/week: 2.0 standard drinks    Types: 2 Standard drinks or equivalent per week    Comment: with meals on occasion  . Drug use: No  .  Sexual activity: Not Currently  Other Topics Concern  . Not on file  Social History Narrative   Widowed 2019- was Married 1967. 2 kids. 1 grandchild. 1 greatgrandchilden.       Retired 2003 from [employer]      Hobbies: fishing, shagging, exercise, cruise, 48 GTO convertible   Social Determinants of Health   Financial Resource Strain:   . Difficulty of Paying Living Expenses:   Food Insecurity:   . Worried About Charity fundraiser in the Last Year:   . Arboriculturist in the Last Year:   Transportation Needs:   . Film/video editor (Medical):   Marland Kitchen Lack of Transportation (Non-Medical):   Physical Activity:   . Days of Exercise per Week:   . Minutes of Exercise per Session:   Stress:   . Feeling of Stress :   Social Connections:   . Frequency of Communication with Friends and Family:   . Frequency of Social Gatherings with Friends and Family:   . Attends Religious Services:   . Active Member of Clubs or Organizations:   . Attends Archivist Meetings:   Marland Kitchen Marital Status:   Intimate Partner Violence:   . Fear of Current or Ex-Partner:   . Emotionally Abused:   Marland Kitchen Physically Abused:   . Sexually Abused:     FAMILY HISTORY: Family History  Problem Relation Age of Onset  . Other Mother        passed age 8  . Hypertension Father   . Prostate cancer Father 38       never operated, not cause of death  . Stroke Father   . Meniere's disease Father   . Early death Paternal Grandfather   . Bladder Cancer Son     ALLERGIES:  is allergic to prednisone, amoxicillin-pot clavulanate, ace inhibitors, and codeine phosphate.  MEDICATIONS:  Current Outpatient Medications  Medication Sig Dispense Refill  . acetaminophen (TYLENOL) 500 MG tablet Take 1,000 mg by mouth every 8 (eight) hours as needed for moderate pain.    Marland Kitchen allopurinol (ZYLOPRIM) 100 MG tablet Take 1 tablet (100 mg total) by mouth daily. 90 tablet 3  . aspirin EC 81 MG tablet Take 81  mg by mouth daily.     Marland Kitchen atorvastatin (LIPITOR) 40 MG tablet Take 1 tablet (40 mg total) by mouth 3 (three) times a week. 90 tablet 3  . clopidogrel (PLAVIX) 75 MG tablet Take 1 tablet (75 mg total) by mouth daily. 30 tablet 0  . polyethylene glycol (MIRALAX / GLYCOLAX) 17 g packet Take 17 g by mouth daily.    . sennosides-docusate sodium (SENOKOT-S) 8.6-50 MG tablet Take 1 tablet by mouth daily as needed for constipation.    . calcium carbonate (TUMS - DOSED IN MG ELEMENTAL CALCIUM) 500 MG chewable tablet Chew 1 tablet by mouth as needed for indigestion or heartburn. (Patient not taking: Reported on 07/16/2019)    . mirtazapine (REMERON) 15 MG tablet TAKE 1 TABLET  BY MOUTH EVERYDAY AT BEDTIME (Patient taking differently: Take 15 mg by mouth at bedtime. ) 90 tablet 1  . oxyCODONE (OXY IR/ROXICODONE) 5 MG immediate release tablet Take 1 tablet (5 mg total) by mouth every 6 (six) hours as needed for severe pain. 5 tablet 0  . pantoprazole (PROTONIX) 40 MG tablet Take 1 tablet (40 mg total) by mouth daily. 30 tablet 3  . tamsulosin (FLOMAX) 0.4 MG CAPS capsule TAKE 1 CAPSULE BY MOUTH EVERY DAY (Patient taking differently: Take 0.4 mg by mouth daily. ) 90 capsule 1   No current facility-administered medications for this visit.   Facility-Administered Medications Ordered in Other Visits  Medication Dose Route Frequency Provider Last Rate Last Admin  . 0.9 %  sodium chloride infusion   Intravenous Once Orson Slick, MD        REVIEW OF SYSTEMS:   Constitutional: ( - ) fevers, ( - )  chills , ( - ) night sweats Eyes: ( - ) blurriness of vision, ( - ) double vision, ( - ) watery eyes Ears, nose, mouth, throat, and face: ( - ) mucositis, ( - ) sore throat Respiratory: ( - ) cough, ( - ) dyspnea, ( - ) wheezes Cardiovascular: ( - ) palpitation, ( - ) chest discomfort, ( - ) lower extremity swelling Gastrointestinal:  ( - ) nausea, ( - ) heartburn, ( - ) change in bowel habits Skin: ( - ) abnormal  skin rashes Lymphatics: ( - ) new lymphadenopathy, ( - ) easy bruising Neurological: ( - ) numbness, ( - ) tingling, ( - ) new weaknesses Behavioral/Psych: ( - ) mood change, ( - ) new changes  All other systems were reviewed with the patient and are negative.  PHYSICAL EXAMINATION: ECOG PERFORMANCE STATUS: 1 - Symptomatic but completely ambulatory  Vitals:   07/26/19 1450  BP: (!) 170/93  Pulse: 81  Resp: 18  Temp: 98.1 F (36.7 C)  SpO2: 97%   Filed Weights   07/26/19 1450  Weight: 121 lb 8 oz (55.1 kg)    GENERAL: well appearing elderly Caucasian male. Alert, no distress and comfortable SKIN: skin color, texture, turgor are normal, no rashes or significant lesions EYES: conjunctiva are pink and non-injected, sclera clear LUNGS: some wheezes and course crackles on lung exam HEART: regular rate & rhythm and no murmurs and no lower extremity edema Musculoskeletal: no cyanosis of digits and no clubbing  PSYCH: alert & oriented x 3, fluent speech NEURO: no focal motor/sensory deficits  LABORATORY DATA:  I have reviewed the data as listed CBC Latest Ref Rng & Units 07/26/2019 07/20/2019 07/19/2019  WBC 4.0 - 10.5 K/uL 13.1(H) 7.8 6.0  Hemoglobin 13.0 - 17.0 g/dL 11.0(L) 10.4(L) 11.3(L)  Hematocrit 39 - 52 % 33.7(L) 31.9(L) 34.0(L)  Platelets 150 - 400 K/uL 362 274 281    CMP Latest Ref Rng & Units 07/26/2019 07/20/2019 07/19/2019  Glucose 70 - 99 mg/dL 150(H) 117(H) 109(H)  BUN 8 - 23 mg/dL 43(H) 16 12  Creatinine 0.61 - 1.24 mg/dL 1.75(H) 1.25(H) 1.13  Sodium 135 - 145 mmol/L 135 138 139  Potassium 3.5 - 5.1 mmol/L 3.8 3.5 3.5  Chloride 98 - 111 mmol/L 94(L) 98 100  CO2 22 - 32 mmol/L '30 31 31  '$ Calcium 8.9 - 10.3 mg/dL 13.4(HH) 12.1(H) 11.7(H)  Total Protein 6.5 - 8.1 g/dL 8.1 6.8 6.7  Total Bilirubin 0.3 - 1.2 mg/dL 0.3 0.5 0.6  Alkaline Phos 38 - 126 U/L  163(H) 104 106  AST 15 - 41 U/L '22 26 17  '$ ALT 0 - 44 U/L 32 18 13    No results found for: MPROTEIN Lab Results    Component Value Date   KAPLAMBRATIO 9.97 06/27/2019     RADIOGRAPHIC STUDIES: DG Chest 2 View  Result Date: 07/16/2019 CLINICAL DATA:  CVA EXAM: CHEST - 2 VIEW COMPARISON:  05/01/2019 FINDINGS: Mild cardiomegaly. Areas of scarring in the left mid lung. Scarring or atelectasis in the lung bases. No effusions or acute bony abnormality. IMPRESSION: Cardiomegaly. Areas of scarring or atelectasis as above. No acute cardiopulmonary disease. Electronically Signed   By: Rolm Baptise M.D.   On: 07/16/2019 17:45   DG Lumbar Spine Complete  Result Date: 07/09/2019 CLINICAL DATA:  Worsening pain, multiple myeloma EXAM: LUMBAR SPINE - COMPLETE 4+ VIEW COMPARISON:  06/06/2019 FINDINGS: Severe osteopenia. Chronic L4 vertebral body compression fracture with prior augmentation and approximately 70% height loss. 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 Severe L1 vertebral body compression fracture with increased height loss compared with 06/06/2019 likely reflecting a acute-subacute fracture superimposed upon a chronic fracture with currently approximately 80% height loss. Degenerative disease with severe disc height loss and endplate sclerosis at X4-G8. Diffuse bilateral facet arthropathy of the lumbar spine. IMPRESSION: 1. Severe L1 vertebral body compression fracture with increased height loss compared with 06/06/2019 likely reflecting an acute-subacute fracture superimposed upon a chronic fracture with currently approximately 80% height loss. 2. 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 Electronically Signed   By: Kathreen Devoid   On: 07/09/2019 11:17   MR ANGIO HEAD WO CONTRAST  Result Date: 07/16/2019 CLINICAL DATA:  Follow-up examination for posterior circulation infarcts. EXAM: MRA HEAD WITHOUT CONTRAST TECHNIQUE: Angiographic images of the Circle of Willis were obtained using MRA technique without intravenous contrast. COMPARISON:  Prior brain MRI from earlier  the same day. FINDINGS: ANTERIOR CIRCULATION: Examination degraded by motion artifact. Visualized distal cervical segments of the internal carotid arteries are widely patent with symmetric antegrade flow. Petrous, cavernous, and supraclinoid ICAs patent without appreciable stenosis or other abnormality. A1 segments patent bilaterally. Grossly normal anterior communicating artery complex. Anterior cerebral arteries patent to their distal aspects without appreciable stenosis. No M1 stenosis or occlusion. Negative MCA bifurcations. Distal MCA branches well perfused and symmetric. POSTERIOR CIRCULATION: Vertebral arteries widely patent to the vertebrobasilar junction without stenosis. Right vertebral artery dominant. Both picas patent. Basilar widely patent to its distal aspect without stenosis. Superior cerebral arteries patent bilaterally. Both PCAs primarily supplied via the basilar and are well perfused to their distal aspects without stenosis. No intracranial aneurysm or other vascular abnormality. IMPRESSION: Negative intracranial MRA. No large vessel occlusion. No hemodynamically significant or correctable stenosis. Electronically Signed   By: Jeannine Boga M.D.   On: 07/16/2019 22:15   MR Brain W and Wo Contrast  Result Date: 07/16/2019 CLINICAL DATA:  Vision loss. 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 History of multiple myeloma. EXAM: MRI HEAD AND ORBITS WITHOUT AND WITH CONTRAST TECHNIQUE: Multiplanar, multiecho pulse sequences of the brain and surrounding structures were obtained without and with intravenous contrast. Multiplanar, multiecho pulse sequences of the orbits and surrounding structures were obtained including fat saturation techniques, before and after intravenous contrast administration. CONTRAST:  4m GADAVIST GADOBUTROL 1 MMOL/ML IV SOLN COMPARISON:  Head CT 07/26/1998 FINDINGS: MRI HEAD FINDINGS Brain: Diffusion imaging shows a punctate acute infarction within the  right superior cerebellum. Punctate acute infarction present  in the left occipital cortex and at the left parietooccipital junction region. No large confluent infarction. Elsewhere, brain shows mild chronic small-vessel disease of hemispheric white matter. No mass, hemorrhage, hydrocephalus or extra-axial collection. After contrast administration, no abnormal enhancement occurs. Vascular: Major vessels at the base of the brain show flow. Skull and upper cervical spine: Negative Other: None MRI ORBITS FINDINGS Orbits: Both globes appear normal. Both optic nerves are normal. Orbital fat is normal. Extra-ocular muscles are normal. No abnormal contrast enhancement. Visualized sinuses: Normal Soft tissues: Normal Limited intracranial: See results of brain MRI. IMPRESSION: MRI orbits: Normal MRI head: Several punctate acute infarctions including 1 in the right cerebellum and 2 to 4 in the left occipital and parietooccipital junction region. 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 No large confluent infarction. No mass effect or hemorrhage. Mild chronic small-vessel ischemic change elsewhere affecting the cerebral hemispheric white matter Electronically Signed   By: Nelson Chimes M.D.   On: 07/16/2019 15:00   DG Bone Survey Met  Result Date: 07/04/2019 CLINICAL DATA:  Lytic lesions on x-ray EXAM: METASTATIC BONE SURVEY COMPARISON:  CT chest abdomen pelvis dated 06/19/2019 FINDINGS: A few tiny sub 5 mm lytic lesions in the skull are suspected. Permeative moth-eaten lytic appearance involving the bilateral humeri, without discrete focal lytic lesions. Bilateral upper extremities are otherwise unremarkable. Known dominant lytic lesion involving the left posterior 9th rib on CT is not radiographically evident. However, there are nondisplaced fractures involving the left lateral 3rd and 4th ribs No definite lytic lesions in the visualized cervical, thoracic, or lumbar spine. Moderate  compression fracture deformities at T12-L2. Prior vertebral augmentation at L4. Subcentimeter lytic lesion in the right mid femur. Permeative mild the lytic appearance involving the left femur. Bilateral lower extremities are otherwise unremarkable. IMPRESSION: A few tiny sub 5 mm lytic lesions in the skull and a single subcentimeter lytic lesion in the right mid femur. Known dominant lytic lesion involving the left posterior 9th rib on CT is not radiographically evident. Nondisplaced fractures involving the left lateral 3rd and 4th ribs. Permeative, moth-eaten lytic appearance involving the bilateral humeri and left femur. Moderate compression fracture deformities at T12-L2. Prior vertebral augmentation at L4. Electronically Signed   By: Julian Hy M.D.   On: 07/04/2019 16:39   ECHOCARDIOGRAM COMPLETE  Result Date: 07/17/2019    ECHOCARDIOGRAM REPORT   Patient Name:   MONT JAGODA Date of Exam: 07/17/2019 Medical Rec #:  [id number]        Height:       66.0 in Accession #:    [id number]       Weight:       118.8 lb Date of Birth:  Dec 12, 1945         BSA:          1.603 m Patient Age:    18 years         BP:           126/65 mmHg Patient Gender: M                HR:           72 bpm. Exam Location:  Inpatient Procedure: 2D Echo Indications:    Stroke 434.91 / I163.9  History:        Patient has no prior history of Echocardiogram examinations.                 Risk Factors:Hypertension and Dyslipidemia. Multiple myeloma,  Raynaud's syndrome.  Sonographer:    Leta Jungling RDCS Referring Phys: [id number] [facility] D NETTEY  Sonographer Comments: Patient is restless and unoriented making exam very difficult IMPRESSIONS  1. Left ventricular ejection fraction, by estimation, is 60 to 65%. The left ventricle has normal function. The left ventricle has no regional wall motion abnormalities. Left ventricular diastolic parameters are consistent with Grade I diastolic dysfunction (impaired relaxation).   2. Right ventricular systolic function is normal. The right ventricular size is normal.  3. The mitral valve is normal in structure. No evidence of mitral valve regurgitation. No evidence of mitral stenosis.  4. The aortic valve is tricuspid. Aortic valve regurgitation is not visualized. Mild to moderate aortic valve sclerosis/calcification is present, without any evidence of aortic stenosis.  5. The inferior vena cava is normal in size with greater than 50% respiratory variability, suggesting right atrial pressure of 3 mmHg. FINDINGS  Left Ventricle: Left ventricular ejection fraction, by estimation, is 60 to 65%. The left ventricle has normal function. The left ventricle has no regional wall motion abnormalities. The left ventricular internal cavity size was normal in size. There is  no left ventricular hypertrophy. Left ventricular diastolic parameters are consistent with Grade I diastolic dysfunction (impaired relaxation). Normal left ventricular filling pressure. Right Ventricle: The right ventricular size is normal. No increase in right ventricular wall thickness. Right ventricular systolic function is normal. Left Atrium: Left atrial size was normal in size. Right Atrium: Right atrial size was normal in size. Pericardium: There is no evidence of pericardial effusion. Mitral Valve: The mitral valve is normal in structure. Normal mobility of the mitral valve leaflets. No evidence of mitral valve regurgitation. No evidence of mitral valve stenosis. Tricuspid Valve: The tricuspid valve is normal in structure. Tricuspid valve regurgitation is not demonstrated. No evidence of tricuspid stenosis. Aortic Valve: The aortic valve is tricuspid. Aortic valve regurgitation is not visualized. Mild to moderate aortic valve sclerosis/calcification is present, without any evidence of aortic stenosis. Pulmonic Valve: The pulmonic valve was normal in structure. Pulmonic valve regurgitation is not visualized. No evidence of  pulmonic stenosis. Aorta: The aortic root is normal in size and structure. Venous: The inferior vena cava is normal in size with greater than 50% respiratory variability, suggesting right atrial pressure of 3 mmHg. IAS/Shunts: No atrial level shunt detected by color flow Doppler.  LEFT VENTRICLE PLAX 2D LVIDd:         3.60 cm  Diastology LVIDs:         2.70 cm  LV e' lateral:   9.36 cm/s LV PW:         1.10 cm  LV E/e' lateral: 8.6 LV IVS:        1.00 cm  LV e' medial:    6.31 cm/s LVOT diam:     2.40 cm  LV E/e' medial:  12.8 LVOT Area:     4.52 cm  RIGHT VENTRICLE RV S prime:     11.70 cm/s TAPSE (M-mode): 1.6 cm LEFT ATRIUM           Index LA diam:      2.70 cm 1.68 cm/m LA Vol (A4C): 26.0 ml 16.22 ml/m   AORTA Ao Root diam: 3.30 cm MITRAL VALVE MV Area (PHT): 3.77 cm     SHUNTS MV Decel Time: 201 msec     Systemic Diam: 2.40 cm MV E velocity: 80.60 cm/s MV A velocity: 101.00 cm/s MV E/A ratio:  0.80 Mihai Croitoru MD Electronically signed by  Dani Gobble Croitoru MD Signature Date/Time: 07/17/2019/11:09:10 AM    Final    VAS US CAROTID (at [facility] and [facility] only)  Result Date: 07/18/2019 Carotid Arterial Duplex Study Indications:   CVA. Risk Factors:  Hypertension, hyperlipidemia. Other Factors: Several punctate acute infarctions. Performing Technologist: June Leap RDMS, RVT  Examination Guidelines: A complete evaluation includes B-mode imaging, spectral Doppler, color Doppler, and power Doppler as needed of all accessible portions of each vessel. Bilateral testing is considered an integral part of a complete examination. Limited examinations for reoccurring indications may be performed as noted.  Right Carotid Findings: +----------+--------+--------+--------+------------------+--------+           PSV cm/sEDV cm/sStenosisPlaque DescriptionComments +----------+--------+--------+--------+------------------+--------+ CCA Prox  86      15                                          +----------+--------+--------+--------+------------------+--------+ CCA Distal77      15                                         +----------+--------+--------+--------+------------------+--------+ ICA Prox  73      21      1-39%   heterogenous               +----------+--------+--------+--------+------------------+--------+ ICA Distal98      24                                         +----------+--------+--------+--------+------------------+--------+ ECA       104     11                                         +----------+--------+--------+--------+------------------+--------+ +----------+--------+-------+----------------+-------------------+           PSV cm/sEDV cmsDescribe        Arm Pressure (mmHG) +----------+--------+-------+----------------+-------------------+ Subclavian119            Multiphasic, WNL                    +----------+--------+-------+----------------+-------------------+ +---------+--------+--+--------+--+---------+ VertebralPSV cm/s50EDV cm/s14Antegrade +---------+--------+--+--------+--+---------+  Left Carotid Findings: +----------+--------+--------+--------+------------------+--------+           PSV cm/sEDV cm/sStenosisPlaque DescriptionComments +----------+--------+--------+--------+------------------+--------+ CCA Prox  130     20                                         +----------+--------+--------+--------+------------------+--------+ CCA Distal114     17                                         +----------+--------+--------+--------+------------------+--------+ ICA Prox  99      25      1-39%   heterogenous               +----------+--------+--------+--------+------------------+--------+ ICA Distal89      20                                         +----------+--------+--------+--------+------------------+--------+  ECA       102     11                                          +----------+--------+--------+--------+------------------+--------+ +----------+--------+--------+----------------+-------------------+           PSV cm/sEDV cm/sDescribe        Arm Pressure (mmHG) +----------+--------+--------+----------------+-------------------+ [id number]              Multiphasic, WNL                    +----------+--------+--------+----------------+-------------------+ +---------+--------+--+--------+--+---------+ VertebralPSV cm/s48EDV cm/s12Antegrade +---------+--------+--+--------+--+---------+   Summary: Right Carotid: Velocities in the right ICA are consistent with a 1-39% stenosis. Left Carotid: Velocities in the left ICA are consistent with a 1-39% stenosis. Vertebrals:  Bilateral vertebral arteries demonstrate antegrade flow. Subclavians: Normal flow hemodynamics were seen in bilateral subclavian              arteries. *See table(s) above for measurements and observations.  Electronically signed by Antony Contras MD on 07/18/2019 at 9:33:43 AM.    Final    MR ORBITS W WO CONTRAST  Result Date: 07/16/2019 CLINICAL DATA:  Vision loss. 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 History of multiple myeloma. EXAM: MRI HEAD AND ORBITS WITHOUT AND WITH CONTRAST TECHNIQUE: Multiplanar, multiecho pulse sequences of the brain and surrounding structures were obtained without and with intravenous contrast. Multiplanar, multiecho pulse sequences of the orbits and surrounding structures were obtained including fat saturation techniques, before and after intravenous contrast administration. CONTRAST:  43m GADAVIST GADOBUTROL 1 MMOL/ML IV SOLN COMPARISON:  Head CT 07/26/1998 FINDINGS: MRI HEAD FINDINGS Brain: Diffusion imaging shows a punctate acute infarction within the right superior cerebellum. Punctate acute infarction present in the left occipital cortex and at the left parietooccipital junction region. No large confluent infarction. Elsewhere, brain shows mild  chronic small-vessel disease of hemispheric white matter. No mass, hemorrhage, hydrocephalus or extra-axial collection. After contrast administration, no abnormal enhancement occurs. Vascular: Major vessels at the base of the brain show flow. Skull and upper cervical spine: Negative Other: None MRI ORBITS FINDINGS Orbits: Both globes appear normal. Both optic nerves are normal. Orbital fat is normal. Extra-ocular muscles are normal. No abnormal contrast enhancement. Visualized sinuses: Normal Soft tissues: Normal Limited intracranial: See results of brain MRI. IMPRESSION: MRI orbits: Normal MRI head: Several punctate acute infarctions including 1 in the right cerebellum and 2 to 4 in the left occipital and parietooccipital junction region. 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 No large confluent infarction. No mass effect or hemorrhage. Mild chronic small-vessel ischemic change elsewhere affecting the cerebral hemispheric white matter Electronically Signed   By: MNelson ChimesM.D.   On: 07/16/2019 15:00    ASSESSMENT & PLAN Kevin DiltzSouthern 74y.o. male with medical history significant for newly diagnosed IgG lambda MM who presents for a follow up visit.  Today we discussed the chemotherapy treatments moving forward. The patient's son and patient were agreeable to proceeding with treatment after learning about the risks, benefits, and possible adverse side effects of the treatment.  Today Mr. Rigaud appears quite well and reports that his back pain is under considerably better control than the last time we saw him.  He does not appear to have any residual defects from the stroke, but does report a chronic issue with difficulty swallowing.  He is also having some wheezing in his lungs which may be due to some chronic aspiration.  He does have a chest x-ray coming up at his facility and is about to be reviewed for potential discharge back to home.   Treatment plan  consists of bortezomib 1.'3mg'$ /m2 on Day 1, 8, and 15, dexamethasone '40mg'$  PO Day 1, 8, and 15, and Lenalidomide '25mg'$  PO day 1-14. Cycles are to be repeated every 21 days and can be continued x 8 cycles. Given his current kidney function will start with Vd and then plan to add revlimid after 1 cycle if well tolerated. Once on VRd this can be reduced to maintenance Vd or Rd if patient has good response. He will also be receiving monthly zometa therapy for lytic bone lesions.   *patient has history of suicide attempt on prednisone. This was discussed with patient and his son who were unsure what role this medication played in his suicide attempt. This regimen contains dexamethasone, which is essential for his treatment. We will attempt full dose at '40mg'$ , but if psychiatric issues arise we can decrease this.   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 Discussed the expected side effects and treatment course.  --VRd regimen can tentatively start as early as next week. Given his worsening kidney function will administer Cycle 1 of treatment without revlimid and consider the addition later if kidney function improves and the treatment is tolerated.  --details of VRd regimen above (Blood (2010) 116 (23): [id number]).  --plan for start on 07/30/2019 with chemotherapy education to be scheduled. --additionally today will start monthly zometa as well (2 years of duration) --RTC on 08/06/2019 prior to start of therapy.   #Lytic Bone Lesions #Compression fracture #Hypercalcemia --patient has received dental clearance for Zometa therapy --zometa administered in clinic today at 4g due to hypercalcemia of malignancy (repeat q 4 weeks, next due Oct 2021)  --continue to monitor   #Supportive Care --supportive therapy with acyclovir '400mg'$  BID and ASA '81mg'$  PO daily.  --nausea medication with zofran '8mg'$  PO daily  --zometa as  above  #Back Pain, improving --pain is currently a 4/10 in severity, much improved from our last visit.  --[facility] recommended TLSO brace for comfort. No procedural intervention recommended at this time.  -- 10 mg oxycodone every 6 hours with alternating 1 g Tylenol every 8 hours for pain control.    #Dysphagia #Coarse Crackles --patient is having difficultly with swallowing and some coarse crackles on lung exam --patient is possibly having episodes of aspiration, though it is not clear if the problem is oropharyngeal or esophageal --he reports food feels like it does not go down and can come back up.  --will refer to GI for further evaluation.    No orders of the defined types were placed in this encounter.   All questions were answered. The patient knows to call the clinic with any problems, questions or concerns.  A total of more than 30 minutes were spent on this encounter and over half of that time was spent on counseling and coordination of care as outlined above.   Ledell Peoples, MD Department of Hematology/Oncology [place] [facility] Phone: [phone] Pager: [phone] Email: [email or web address]  07/26/2019 6:30 PM   Bringhen Chauncey Cruel Dewayne Hatch, [place], [place] S, [place] F, [place], [place], Guglielmelli T, [place], [place]  Anselm Pancoast, [place], Musto P, [place], [place] AM, [place], [place] AP, [place] Kos, Cavo M, Gaidano G, Boccadoro M, Palumbo A. Efficacy and safety of once-weekly bortezomib in multiple myeloma patients. Blood. 2010 Dec 2;116(23):4745-53.  Erratum in: Blood. 2012 Dec 20;120(26):5250.  --Within the limitations of this nonrandomized post-hoc analysis, once-weekly bortezomib seems to be equally effective and better tolerated than the standard twice-weekly schedule. The outcomes and response rate did not appear to be affected by the bortezomib dosing schedule. Durability of response  was similar between groups: median PFS was 33.1 months in the once-weekly group and 31.7 months in the twice-weekly group. Overall response rates were 85% and 86%, including CR rates of 30% and 35%, in the once-weekly and twice-weekly groups, respectively.

## 2019-07-26 NOTE — Telephone Encounter (Signed)
CRITICAL VALUE STICKER  CRITICAL VALUE: Calcium 13.4  RECEIVER (on-site recipient of call):Sandi K, RN  DATE & TIME NOTIFIED: 07/26/19 @ 1507  MESSENGER (representative from lab): Lelan Pons  MD NOTIFIED: Dr. Lorenso Courier  TIME OF NOTIFICATION: 1510  RESPONSE: Information acknowledged

## 2019-07-26 NOTE — Patient Instructions (Signed)
Zoledronic Acid injection (Hypercalcemia, Oncology) What is this medicine? ZOLEDRONIC ACID (ZOE le dron ik AS id) lowers the amount of calcium loss from bone. It is used to treat too much calcium in your blood from cancer. It is also used to prevent complications of cancer that has spread to the bone. This medicine may be used for other purposes; ask your health care provider or pharmacist if you have questions. COMMON BRAND NAME(S): Zometa What should I tell my health care provider before I take this medicine? They need to know if you have any of these conditions:  aspirin-sensitive asthma  cancer, especially if you are receiving medicines used to treat cancer  dental disease or wear dentures  infection  kidney disease  receiving corticosteroids like dexamethasone or prednisone  an unusual or allergic reaction to zoledronic acid, other medicines, foods, dyes, or preservatives  pregnant or trying to get pregnant  breast-feeding How should I use this medicine? This medicine is for infusion into a vein. It is given by a health care professional in a hospital or clinic setting. Talk to your pediatrician regarding the use of this medicine in children. Special care may be needed. Overdosage: If you think you have taken too much of this medicine contact a poison control center or emergency room at once. NOTE: This medicine is only for you. Do not share this medicine with others. What if I miss a dose? It is important not to miss your dose. Call your doctor or health care professional if you are unable to keep an appointment. What may interact with this medicine?  certain antibiotics given by injection  NSAIDs, medicines for pain and inflammation, like ibuprofen or naproxen  some diuretics like bumetanide, furosemide  teriparatide  thalidomide This list may not describe all possible interactions. Give your health care provider a list of all the medicines, herbs, non-prescription  drugs, or dietary supplements you use. Also tell them if you smoke, drink alcohol, or use illegal drugs. Some items may interact with your medicine. What should I watch for while using this medicine? Visit your doctor or health care professional for regular checkups. It may be some time before you see the benefit from this medicine. Do not stop taking your medicine unless your doctor tells you to. Your doctor may order blood tests or other tests to see how you are doing. Women should inform their doctor if they wish to become pregnant or think they might be pregnant. There is a potential for serious side effects to an unborn child. Talk to your health care professional or pharmacist for more information. You should make sure that you get enough calcium and vitamin D while you are taking this medicine. Discuss the foods you eat and the vitamins you take with your health care professional. Some people who take this medicine have severe bone, joint, and/or muscle pain. This medicine may also increase your risk for jaw problems or a broken thigh bone. Tell your doctor right away if you have severe pain in your jaw, bones, joints, or muscles. Tell your doctor if you have any pain that does not go away or that gets worse. Tell your dentist and dental surgeon that you are taking this medicine. You should not have major dental surgery while on this medicine. See your dentist to have a dental exam and fix any dental problems before starting this medicine. Take good care of your teeth while on this medicine. Make sure you see your dentist for regular follow-up   appointments. What side effects may I notice from receiving this medicine? Side effects that you should report to your doctor or health care professional as soon as possible:  allergic reactions like skin rash, itching or hives, swelling of the face, lips, or tongue  anxiety, confusion, or depression  breathing problems  changes in vision  eye  pain  feeling faint or lightheaded, falls  jaw pain, especially after dental work  mouth sores  muscle cramps, stiffness, or weakness  redness, blistering, peeling or loosening of the skin, including inside the mouth  trouble passing urine or change in the amount of urine Side effects that usually do not require medical attention (report to your doctor or health care professional if they continue or are bothersome):  bone, joint, or muscle pain  constipation  diarrhea  fever  hair loss  irritation at site where injected  loss of appetite  nausea, vomiting  stomach upset  trouble sleeping  trouble swallowing  weak or tired This list may not describe all possible side effects. Call your doctor for medical advice about side effects. You may report side effects to FDA at 1-800-FDA-1088. Where should I keep my medicine? This drug is given in a hospital or clinic and will not be stored at home. NOTE: This sheet is a summary. It may not cover all possible information. If you have questions about this medicine, talk to your doctor, pharmacist, or health care provider.  2020 Elsevier/Gold Standard (2013-06-02 14:19:39)  

## 2019-07-26 NOTE — Progress Notes (Signed)
OK to run IVF NS over 1.5 hours today per MD Lorenso Courier

## 2019-07-27 ENCOUNTER — Other Ambulatory Visit: Payer: Self-pay

## 2019-07-27 ENCOUNTER — Telehealth: Payer: Self-pay | Admitting: Internal Medicine

## 2019-07-27 ENCOUNTER — Telehealth: Payer: Self-pay

## 2019-07-27 DIAGNOSIS — R1310 Dysphagia, unspecified: Secondary | ICD-10-CM

## 2019-07-27 NOTE — Telephone Encounter (Signed)
I reviewed the chart   Next step is a barium swallow with tablet re: dysphagia  Please arrange that - order in my name and once I see the results will determine the next step(s)

## 2019-07-27 NOTE — Telephone Encounter (Signed)
Scheduled patient for Barium Swallow Study on 08/01/19 at [facility]. Patient to arrive at 10:15am and be NPO 3 hours before. Son Gerald Stabs) notified of the above

## 2019-07-27 NOTE — Telephone Encounter (Signed)
Called son back, got voice mail. Left message to please call back again

## 2019-07-27 NOTE — Telephone Encounter (Signed)
Received referral from Dr. Lorenso Courier for Dysphagia. Also spoke to son and he states patient is at a Rehab facility and for about 1 week they have been having to puree his food. There are no office appts. Even with the apps. Until 08/21/19. Please advise.

## 2019-07-27 NOTE — Telephone Encounter (Signed)
Pt's son Gerald Stabs is requesting a call back to schedule an appt for his father. He is experiencing trouble swallowing. He last saw Dr Carlean Purl in 2015 for a colonoscopy. Next available is around 8/11 which pt does not want to wait that long

## 2019-07-27 NOTE — Telephone Encounter (Signed)
Left message for son  Gerald Stabs) to please call back

## 2019-07-30 ENCOUNTER — Other Ambulatory Visit: Payer: Self-pay | Admitting: *Deleted

## 2019-07-30 ENCOUNTER — Other Ambulatory Visit: Payer: Self-pay

## 2019-07-30 ENCOUNTER — Inpatient Hospital Stay: Payer: No Typology Code available for payment source

## 2019-07-30 VITALS — BP 145/83 | HR 71 | Temp 98.4°F | Resp 20

## 2019-07-30 DIAGNOSIS — Z5112 Encounter for antineoplastic immunotherapy: Secondary | ICD-10-CM | POA: Diagnosis not present

## 2019-07-30 DIAGNOSIS — C9000 Multiple myeloma not having achieved remission: Secondary | ICD-10-CM | POA: Diagnosis not present

## 2019-07-30 DIAGNOSIS — M25512 Pain in left shoulder: Secondary | ICD-10-CM | POA: Diagnosis not present

## 2019-07-30 DIAGNOSIS — M25511 Pain in right shoulder: Secondary | ICD-10-CM | POA: Diagnosis not present

## 2019-07-30 DIAGNOSIS — I639 Cerebral infarction, unspecified: Secondary | ICD-10-CM | POA: Diagnosis not present

## 2019-07-30 LAB — CBC WITH DIFFERENTIAL (CANCER CENTER ONLY)
Abs Immature Granulocytes: 0.05 10*3/uL (ref 0.00–0.07)
Basophils Absolute: 0 10*3/uL (ref 0.0–0.1)
Basophils Relative: 0 %
Eosinophils Absolute: 0.2 10*3/uL (ref 0.0–0.5)
Eosinophils Relative: 2 %
HCT: 31.2 % — ABNORMAL LOW (ref 39.0–52.0)
Hemoglobin: 10.3 g/dL — ABNORMAL LOW (ref 13.0–17.0)
Immature Granulocytes: 1 %
Lymphocytes Relative: 14 %
Lymphs Abs: 1.3 10*3/uL (ref 0.7–4.0)
MCH: 30 pg (ref 26.0–34.0)
MCHC: 33 g/dL (ref 30.0–36.0)
MCV: 91 fL (ref 80.0–100.0)
Monocytes Absolute: 0.8 10*3/uL (ref 0.1–1.0)
Monocytes Relative: 8 %
Neutro Abs: 7.5 10*3/uL (ref 1.7–7.7)
Neutrophils Relative %: 75 %
Platelet Count: 354 10*3/uL (ref 150–400)
RBC: 3.43 MIL/uL — ABNORMAL LOW (ref 4.22–5.81)
RDW: 13.3 % (ref 11.5–15.5)
WBC Count: 9.8 10*3/uL (ref 4.0–10.5)
nRBC: 0 % (ref 0.0–0.2)

## 2019-07-30 LAB — CMP (CANCER CENTER ONLY)
ALT: 21 U/L (ref 0–44)
AST: 18 U/L (ref 15–41)
Albumin: 2.8 g/dL — ABNORMAL LOW (ref 3.5–5.0)
Alkaline Phosphatase: 128 U/L — ABNORMAL HIGH (ref 38–126)
Anion gap: 10 (ref 5–15)
BUN: 35 mg/dL — ABNORMAL HIGH (ref 8–23)
CO2: 25 mmol/L (ref 22–32)
Calcium: 8.7 mg/dL — ABNORMAL LOW (ref 8.9–10.3)
Chloride: 102 mmol/L (ref 98–111)
Creatinine: 1.26 mg/dL — ABNORMAL HIGH (ref 0.61–1.24)
GFR, Est AFR Am: >60 mL/min (ref >60)
GFR, Estimated: 56 mL/min — ABNORMAL LOW (ref >60)
Glucose, Bld: 111 mg/dL — ABNORMAL HIGH (ref 70–99)
Potassium: 3.5 mmol/L (ref 3.5–5.1)
Sodium: 137 mmol/L (ref 135–145)
Total Bilirubin: 0.3 mg/dL (ref 0.3–1.2)
Total Protein: 7.4 g/dL (ref 6.5–8.1)

## 2019-07-30 MED ORDER — BORTEZOMIB CHEMO SQ INJECTION 3.5 MG (2.5MG/ML)
1.3000 mg/m2 | Freq: Once | INTRAMUSCULAR | Status: AC
  Administered 2019-07-30: 2.25 mg via SUBCUTANEOUS
  Filled 2019-07-30: qty 0.9

## 2019-07-30 MED ORDER — ACETAMINOPHEN 500 MG PO TABS
1000.0000 mg | ORAL_TABLET | Freq: Once | ORAL | Status: AC
  Administered 2019-07-30: 1000 mg via ORAL

## 2019-07-30 MED ORDER — PROCHLORPERAZINE MALEATE 10 MG PO TABS
ORAL_TABLET | ORAL | Status: AC
  Filled 2019-07-30: qty 1

## 2019-07-30 MED ORDER — PROCHLORPERAZINE MALEATE 10 MG PO TABS
10.0000 mg | ORAL_TABLET | Freq: Once | ORAL | Status: AC
  Administered 2019-07-30: 10 mg via ORAL

## 2019-07-30 MED ORDER — DEXAMETHASONE 4 MG PO TABS
40.0000 mg | ORAL_TABLET | Freq: Once | ORAL | Status: AC
  Administered 2019-07-30: 40 mg via ORAL

## 2019-07-30 MED ORDER — ACETAMINOPHEN 500 MG PO TABS
ORAL_TABLET | ORAL | Status: AC
  Filled 2019-07-30: qty 2

## 2019-07-30 MED ORDER — DEXAMETHASONE 4 MG PO TABS
ORAL_TABLET | ORAL | Status: AC
  Filled 2019-07-30: qty 10

## 2019-07-30 NOTE — Progress Notes (Signed)
Per Dr. Lorenso Courier, patient to receive dexamethasone 40mg  PO weekly on his Velcade treatment days. I did review the note in his chart about previous experience with suicidal ideation and prednisone use. He said this was one time a long time ago and is unsure if it was related to the medication, but he has taken other steroids since this without issues. He is comfortable with the dexamethasone component of treatment at this time and will seek attention immediately if any thoughts return and will let us know for future doses.   Demetrius Charity, PharmD, BCPS, [facility] Pharmacist Pharmacy Phone: [phone] 07/30/2019

## 2019-07-30 NOTE — Patient Instructions (Addendum)
[facility] Discharge Instructions for Patients Receiving Chemotherapy  Today you received the following chemotherapy agents: Velcade   To help prevent nausea and vomiting after your treatment, we encourage you to take your nausea medication as directed.    If you develop nausea and vomiting that is not controlled by your nausea medication, call the clinic.   BELOW ARE SYMPTOMS THAT SHOULD BE REPORTED IMMEDIATELY:  *FEVER GREATER THAN 100.5 F  *CHILLS WITH OR WITHOUT FEVER  NAUSEA AND VOMITING THAT IS NOT CONTROLLED WITH YOUR NAUSEA MEDICATION  *UNUSUAL SHORTNESS OF BREATH  *UNUSUAL BRUISING OR BLEEDING  TENDERNESS IN MOUTH AND THROAT WITH OR WITHOUT PRESENCE OF ULCERS  *URINARY PROBLEMS  *BOWEL PROBLEMS  UNUSUAL RASH Items with * indicate a potential emergency and should be followed up as soon as possible.  Feel free to call the clinic should you have any questions or concerns. The clinic phone number is [phone].  Please show the Kevin Perez at check-in to the Emergency Department and triage nurse.  Bortezomib injection What is this medicine? BORTEZOMIB (bor TEZ oh mib) is a medicine that targets proteins in cancer cells and stops the cancer cells from growing. It is used to treat multiple myeloma and mantle-cell lymphoma. This medicine may be used for other purposes; ask your health care provider or pharmacist if you have questions. COMMON BRAND NAME(S): Velcade What should I tell my health care provider before I take this medicine? They need to know if you have any of these conditions:  diabetes  heart disease  irregular heartbeat  liver disease  on hemodialysis  low blood counts, like low white blood cells, platelets, or hemoglobin  peripheral neuropathy  taking medicine for blood pressure  an unusual or allergic reaction to bortezomib, mannitol, boron, other medicines, foods, dyes, or preservatives  pregnant or trying to get  pregnant  breast-feeding How should I use this medicine? This medicine is for injection into a vein or for injection under the skin. It is given by a health care professional in a hospital or clinic setting. Talk to your pediatrician regarding the use of this medicine in children. Special care may be needed. Overdosage: If you think you have taken too much of this medicine contact a poison control center or emergency room at once. NOTE: This medicine is only for you. Do not share this medicine with others. What if I miss a dose? It is important not to miss your dose. Call your doctor or health care professional if you are unable to keep an appointment. What may interact with this medicine? This medicine may interact with the following medications:  ketoconazole  rifampin  ritonavir  St. John's Wort This list may not describe all possible interactions. Give your health care provider a list of all the medicines, herbs, non-prescription drugs, or dietary supplements you use. Also tell them if you smoke, drink alcohol, or use illegal drugs. Some items may interact with your medicine. What should I watch for while using this medicine? You may get drowsy or dizzy. Do not drive, use machinery, or do anything that needs mental alertness until you know how this medicine affects you. Do not stand or sit up quickly, especially if you are an older patient. This reduces the risk of dizzy or fainting spells. In some cases, you may be given additional medicines to help with side effects. Follow all directions for their use. Call your doctor or health care professional for advice if you get  a fever, chills or sore throat, or other symptoms of a cold or flu. Do not treat yourself. This drug decreases your body's ability to fight infections. Try to avoid being around people who are sick. This medicine may increase your risk to bruise or bleed. Call your doctor or health care professional if you notice any  unusual bleeding. You may need blood work done while you are taking this medicine. In some patients, this medicine may cause a serious brain infection that may cause death. If you have any problems seeing, thinking, speaking, walking, or standing, tell your doctor right away. If you cannot reach your doctor, urgently seek other source of medical care. Check with your doctor or health care professional if you get an attack of severe diarrhea, nausea and vomiting, or if you sweat a lot. The loss of too much body fluid can make it dangerous for you to take this medicine. Do not become pregnant while taking this medicine or for at least 7 months after stopping it. Women should inform their doctor if they wish to become pregnant or think they might be pregnant. Men should not father a child while taking this medicine and for at least 4 months after stopping it. There is a potential for serious side effects to an unborn child. Talk to your health care professional or pharmacist for more information. Do not breast-feed an infant while taking this medicine or for 2 months after stopping it. This medicine may interfere with the ability to have a child. You should talk with your doctor or health care professional if you are concerned about your fertility. What side effects may I notice from receiving this medicine? Side effects that you should report to your doctor or health care professional as soon as possible:  allergic reactions like skin rash, itching or hives, swelling of the face, lips, or tongue  breathing problems  changes in hearing  changes in vision  fast, irregular heartbeat  feeling faint or lightheaded, falls  pain, tingling, numbness in the hands or feet  right upper belly pain  seizures  swelling of the ankles, feet, hands  unusual bleeding or bruising  unusually weak or tired  vomiting  yellowing of the eyes or skin Side effects that usually do not require medical  attention (report to your doctor or health care professional if they continue or are bothersome):  changes in emotions or moods  constipation  diarrhea  loss of appetite  headache  irritation at site where injected  nausea This list may not describe all possible side effects. Call your doctor for medical advice about side effects. You may report side effects to FDA at 1-800-FDA-1088. Where should I keep my medicine? This drug is given in a hospital or clinic and will not be stored at home. NOTE: This sheet is a summary. It may not cover all possible information. If you have questions about this medicine, talk to your doctor, pharmacist, or health care provider.  2020 Elsevier/Gold Standard (2017-05-16 16:29:31)

## 2019-07-31 ENCOUNTER — Other Ambulatory Visit: Payer: Self-pay | Admitting: *Deleted

## 2019-07-31 NOTE — Patient Outreach (Signed)
Screened for potential [facility] Care Management needs as a benefit of  [employer].  Mr. Prehn is currently receiving skilled therapy at [facility].   Writer attended telephonic interdisciplinary team meeting to assess for disposition needs and transition plan for resident.   Facility reports member is now on weekly chemo infusions. States member likely to transition home alone soon. States Mr. Couse has very supportive son and daughter in law.   Will plan outreach to discuss [facility] services.     Marthenia Rolling, MSN-Ed, RN,BSN [place] Acute Care Coordinator [phone] [facility]) [phone]  (Toll free office)

## 2019-08-01 ENCOUNTER — Ambulatory Visit (HOSPITAL_COMMUNITY)
Admission: RE | Admit: 2019-08-01 | Discharge: 2019-08-01 | Disposition: A | Discharge status: Home / Self Care | Payer: No Typology Code available for payment source | Source: Ambulatory Visit | Attending: Internal Medicine | Admitting: Internal Medicine

## 2019-08-01 ENCOUNTER — Other Ambulatory Visit: Payer: Self-pay

## 2019-08-01 DIAGNOSIS — K224 Dyskinesia of esophagus: Secondary | ICD-10-CM | POA: Diagnosis not present

## 2019-08-01 DIAGNOSIS — R1310 Dysphagia, unspecified: Secondary | ICD-10-CM | POA: Diagnosis not present

## 2019-08-03 ENCOUNTER — Encounter: Payer: Self-pay | Admitting: Hematology and Oncology

## 2019-08-04 DIAGNOSIS — I677 Cerebral arteritis, not elsewhere classified: Secondary | ICD-10-CM | POA: Diagnosis not present

## 2019-08-04 DIAGNOSIS — N400 Enlarged prostate without lower urinary tract symptoms: Secondary | ICD-10-CM | POA: Diagnosis not present

## 2019-08-04 DIAGNOSIS — Z981 Arthrodesis status: Secondary | ICD-10-CM | POA: Diagnosis not present

## 2019-08-04 DIAGNOSIS — H269 Unspecified cataract: Secondary | ICD-10-CM | POA: Diagnosis not present

## 2019-08-04 DIAGNOSIS — I69398 Other sequelae of cerebral infarction: Secondary | ICD-10-CM | POA: Diagnosis not present

## 2019-08-04 DIAGNOSIS — Z7902 Long term (current) use of antithrombotics/antiplatelets: Secondary | ICD-10-CM | POA: Diagnosis not present

## 2019-08-04 DIAGNOSIS — M545 Low back pain: Secondary | ICD-10-CM | POA: Diagnosis not present

## 2019-08-04 DIAGNOSIS — C9000 Multiple myeloma not having achieved remission: Secondary | ICD-10-CM | POA: Diagnosis not present

## 2019-08-04 DIAGNOSIS — I10 Essential (primary) hypertension: Secondary | ICD-10-CM | POA: Diagnosis not present

## 2019-08-04 DIAGNOSIS — E785 Hyperlipidemia, unspecified: Secondary | ICD-10-CM | POA: Diagnosis not present

## 2019-08-04 DIAGNOSIS — Z87891 Personal history of nicotine dependence: Secondary | ICD-10-CM | POA: Diagnosis not present

## 2019-08-04 DIAGNOSIS — Z915 Personal history of self-harm: Secondary | ICD-10-CM | POA: Diagnosis not present

## 2019-08-04 DIAGNOSIS — I130 Hypertensive heart and chronic kidney disease with heart failure and stage 1 through stage 4 chronic kidney disease, or unspecified chronic kidney disease: Secondary | ICD-10-CM | POA: Diagnosis not present

## 2019-08-04 DIAGNOSIS — F419 Anxiety disorder, unspecified: Secondary | ICD-10-CM | POA: Diagnosis not present

## 2019-08-04 DIAGNOSIS — K219 Gastro-esophageal reflux disease without esophagitis: Secondary | ICD-10-CM | POA: Diagnosis not present

## 2019-08-04 DIAGNOSIS — I7300 Raynaud's syndrome without gangrene: Secondary | ICD-10-CM | POA: Diagnosis not present

## 2019-08-04 DIAGNOSIS — Z87311 Personal history of (healed) other pathological fracture: Secondary | ICD-10-CM | POA: Diagnosis not present

## 2019-08-04 DIAGNOSIS — M109 Gout, unspecified: Secondary | ICD-10-CM | POA: Diagnosis not present

## 2019-08-04 DIAGNOSIS — Z7982 Long term (current) use of aspirin: Secondary | ICD-10-CM | POA: Diagnosis not present

## 2019-08-04 DIAGNOSIS — N183 Chronic kidney disease, stage 3 unspecified: Secondary | ICD-10-CM | POA: Diagnosis not present

## 2019-08-04 DIAGNOSIS — I5032 Chronic diastolic (congestive) heart failure: Secondary | ICD-10-CM | POA: Diagnosis not present

## 2019-08-04 DIAGNOSIS — L89311 Pressure ulcer of right buttock, stage 1: Secondary | ICD-10-CM | POA: Diagnosis not present

## 2019-08-04 DIAGNOSIS — R531 Weakness: Secondary | ICD-10-CM | POA: Diagnosis not present

## 2019-08-04 DIAGNOSIS — Z8673 Personal history of transient ischemic attack (TIA), and cerebral infarction without residual deficits: Secondary | ICD-10-CM | POA: Diagnosis not present

## 2019-08-04 DIAGNOSIS — K5900 Constipation, unspecified: Secondary | ICD-10-CM | POA: Diagnosis not present

## 2019-08-04 DIAGNOSIS — R1310 Dysphagia, unspecified: Secondary | ICD-10-CM | POA: Diagnosis not present

## 2019-08-06 ENCOUNTER — Ambulatory Visit (HOSPITAL_COMMUNITY)
Admission: RE | Admit: 2019-08-06 | Discharge: 2019-08-06 | Disposition: A | Discharge status: Home / Self Care | Payer: Medicare Other | Source: Ambulatory Visit | Attending: Hematology and Oncology | Admitting: Hematology and Oncology

## 2019-08-06 ENCOUNTER — Telehealth: Payer: Self-pay | Admitting: Family Medicine

## 2019-08-06 ENCOUNTER — Inpatient Hospital Stay: Payer: No Typology Code available for payment source

## 2019-08-06 ENCOUNTER — Other Ambulatory Visit: Payer: Self-pay | Admitting: Hematology and Oncology

## 2019-08-06 ENCOUNTER — Other Ambulatory Visit: Payer: Self-pay

## 2019-08-06 ENCOUNTER — Inpatient Hospital Stay (HOSPITAL_BASED_OUTPATIENT_CLINIC_OR_DEPARTMENT_OTHER): Payer: No Typology Code available for payment source | Admitting: Hematology and Oncology

## 2019-08-06 VITALS — BP 122/60 | HR 77 | Temp 98.5°F | Resp 20 | Wt 118.2 lb

## 2019-08-06 DIAGNOSIS — C9000 Multiple myeloma not having achieved remission: Secondary | ICD-10-CM

## 2019-08-06 DIAGNOSIS — R1310 Dysphagia, unspecified: Secondary | ICD-10-CM

## 2019-08-06 DIAGNOSIS — S4992XA Unspecified injury of left shoulder and upper arm, initial encounter: Secondary | ICD-10-CM | POA: Diagnosis not present

## 2019-08-06 DIAGNOSIS — I639 Cerebral infarction, unspecified: Secondary | ICD-10-CM

## 2019-08-06 DIAGNOSIS — R296 Repeated falls: Secondary | ICD-10-CM

## 2019-08-06 DIAGNOSIS — M25512 Pain in left shoulder: Secondary | ICD-10-CM

## 2019-08-06 DIAGNOSIS — Z87891 Personal history of nicotine dependence: Secondary | ICD-10-CM

## 2019-08-06 DIAGNOSIS — M25511 Pain in right shoulder: Secondary | ICD-10-CM

## 2019-08-06 DIAGNOSIS — Z5112 Encounter for antineoplastic immunotherapy: Secondary | ICD-10-CM | POA: Diagnosis not present

## 2019-08-06 DIAGNOSIS — S4991XA Unspecified injury of right shoulder and upper arm, initial encounter: Secondary | ICD-10-CM | POA: Diagnosis not present

## 2019-08-06 DIAGNOSIS — Z8659 Personal history of other mental and behavioral disorders: Secondary | ICD-10-CM

## 2019-08-06 DIAGNOSIS — I677 Cerebral arteritis, not elsewhere classified: Secondary | ICD-10-CM | POA: Diagnosis not present

## 2019-08-06 DIAGNOSIS — M19011 Primary osteoarthritis, right shoulder: Secondary | ICD-10-CM | POA: Diagnosis not present

## 2019-08-06 DIAGNOSIS — I7300 Raynaud's syndrome without gangrene: Secondary | ICD-10-CM | POA: Diagnosis not present

## 2019-08-06 DIAGNOSIS — I5032 Chronic diastolic (congestive) heart failure: Secondary | ICD-10-CM | POA: Diagnosis not present

## 2019-08-06 DIAGNOSIS — N183 Chronic kidney disease, stage 3 unspecified: Secondary | ICD-10-CM | POA: Diagnosis not present

## 2019-08-06 DIAGNOSIS — M19012 Primary osteoarthritis, left shoulder: Secondary | ICD-10-CM | POA: Diagnosis not present

## 2019-08-06 DIAGNOSIS — I130 Hypertensive heart and chronic kidney disease with heart failure and stage 1 through stage 4 chronic kidney disease, or unspecified chronic kidney disease: Secondary | ICD-10-CM | POA: Diagnosis not present

## 2019-08-06 LAB — CMP (CANCER CENTER ONLY)
ALT: 20 U/L (ref 0–44)
AST: 23 U/L (ref 15–41)
Albumin: 2.9 g/dL — ABNORMAL LOW (ref 3.5–5.0)
Alkaline Phosphatase: 138 U/L — ABNORMAL HIGH (ref 38–126)
Anion gap: 10 (ref 5–15)
BUN: 29 mg/dL — ABNORMAL HIGH (ref 8–23)
CO2: 23 mmol/L (ref 22–32)
Calcium: 8.9 mg/dL (ref 8.9–10.3)
Chloride: 110 mmol/L (ref 98–111)
Creatinine: 1.1 mg/dL (ref 0.61–1.24)
GFR, Est AFR Am: >60 mL/min (ref >60)
GFR, Estimated: >60 mL/min (ref >60)
Glucose, Bld: 121 mg/dL — ABNORMAL HIGH (ref 70–99)
Potassium: 4.2 mmol/L (ref 3.5–5.1)
Sodium: 143 mmol/L (ref 135–145)
Total Bilirubin: 0.3 mg/dL (ref 0.3–1.2)
Total Protein: 8.2 g/dL — ABNORMAL HIGH (ref 6.5–8.1)

## 2019-08-06 LAB — CBC WITH DIFFERENTIAL (CANCER CENTER ONLY)
Abs Immature Granulocytes: 0.04 10*3/uL (ref 0.00–0.07)
Basophils Absolute: 0 10*3/uL (ref 0.0–0.1)
Basophils Relative: 0 %
Eosinophils Absolute: 0.1 10*3/uL (ref 0.0–0.5)
Eosinophils Relative: 1 %
HCT: 33.3 % — ABNORMAL LOW (ref 39.0–52.0)
Hemoglobin: 10.7 g/dL — ABNORMAL LOW (ref 13.0–17.0)
Immature Granulocytes: 0 %
Lymphocytes Relative: 15 %
Lymphs Abs: 1.5 10*3/uL (ref 0.7–4.0)
MCH: 29.6 pg (ref 26.0–34.0)
MCHC: 32.1 g/dL (ref 30.0–36.0)
MCV: 92.2 fL (ref 80.0–100.0)
Monocytes Absolute: 0.6 10*3/uL (ref 0.1–1.0)
Monocytes Relative: 6 %
Neutro Abs: 7.8 10*3/uL — ABNORMAL HIGH (ref 1.7–7.7)
Neutrophils Relative %: 78 %
Platelet Count: 330 10*3/uL (ref 150–400)
RBC: 3.61 MIL/uL — ABNORMAL LOW (ref 4.22–5.81)
RDW: 13.7 % (ref 11.5–15.5)
WBC Count: 10 10*3/uL (ref 4.0–10.5)
nRBC: 0 % (ref 0.0–0.2)

## 2019-08-06 LAB — LACTATE DEHYDROGENASE: LDH: 180 U/L (ref 98–192)

## 2019-08-06 MED ORDER — PROCHLORPERAZINE MALEATE 10 MG PO TABS
ORAL_TABLET | ORAL | Status: AC
  Filled 2019-08-06: qty 1

## 2019-08-06 MED ORDER — BORTEZOMIB CHEMO SQ INJECTION 3.5 MG (2.5MG/ML)
1.3000 mg/m2 | Freq: Once | INTRAMUSCULAR | Status: AC
  Administered 2019-08-06: 2.25 mg via SUBCUTANEOUS
  Filled 2019-08-06: qty 0.9

## 2019-08-06 MED ORDER — DEXAMETHASONE 4 MG PO TABS
40.0000 mg | ORAL_TABLET | Freq: Once | ORAL | Status: AC
  Administered 2019-08-06: 40 mg via ORAL

## 2019-08-06 MED ORDER — PROCHLORPERAZINE MALEATE 10 MG PO TABS
10.0000 mg | ORAL_TABLET | Freq: Once | ORAL | Status: AC
  Administered 2019-08-06: 10 mg via ORAL

## 2019-08-06 MED ORDER — DEXAMETHASONE 4 MG PO TABS
ORAL_TABLET | ORAL | Status: AC
  Filled 2019-08-06: qty 10

## 2019-08-06 NOTE — Patient Instructions (Signed)
[facility] Discharge Instructions for Patients Receiving Chemotherapy  Today you received the following chemotherapy agents: bortezomib.  To help prevent nausea and vomiting after your treatment, we encourage you to take your nausea medication as directed.   If you develop nausea and vomiting that is not controlled by your nausea medication, call the clinic.   BELOW ARE SYMPTOMS THAT SHOULD BE REPORTED IMMEDIATELY:  *FEVER GREATER THAN 100.5 F  *CHILLS WITH OR WITHOUT FEVER  NAUSEA AND VOMITING THAT IS NOT CONTROLLED WITH YOUR NAUSEA MEDICATION  *UNUSUAL SHORTNESS OF BREATH  *UNUSUAL BRUISING OR BLEEDING  TENDERNESS IN MOUTH AND THROAT WITH OR WITHOUT PRESENCE OF ULCERS  *URINARY PROBLEMS  *BOWEL PROBLEMS  UNUSUAL RASH Items with * indicate a potential emergency and should be followed up as soon as possible.  Feel free to call the clinic should you have any questions or concerns. The clinic phone number is [phone].  Please show the CHEMO ALERT CARD at check-in to the Emergency Department and triage nurse.   

## 2019-08-06 NOTE — Telephone Encounter (Signed)
Kevin Perez from [facility] called asking for verbal orders from Dr. Yong Channel for speech therapy. Pt is experiencing difficulty swallowing and chewing. Please advise.

## 2019-08-07 ENCOUNTER — Encounter: Payer: Self-pay | Admitting: Hematology and Oncology

## 2019-08-07 NOTE — Progress Notes (Signed)
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 Moderately severe compression fracture of L4 with bone month edema consistent with a more recent fracture. 3) 05/31/2019: patient underwent IR vertebral body augmentation for painful compression fracture at L4 using vertebroplasty technique 4) 06/20/2019: CT C/A/P demonstrated diffuse, heterogeneously lytic, moth-eaten appearance of the osseous structures, most consistent with multiple myeloma. SPEP showed M protein 1.5, Kapp 29.9, Lambda 16.1, Ratio 1.86.  5) 06/25/2019: establish care with Dr. Lorenso Courier  6) 07/03/2019: Bone marrow biopsy confirmed plasma cell myeloma with 30% lambda restricted plasma cells.  7) 07/09/2019: presented for discussion of diagnosis/treatment. Taken to ED for severe 10/10 worsening back pain.  8) 07/16/2019: admitted to [facility] hospital for a CVA, MRI showing several punctate acute infarctions including 1 in the right cerebellum and 2 to 4 in the left occipital and parietooccipital junction region 9) 07/30/2019: Cycle 1 Day 1 of Velcade/Dexamethasone 10) 08/06/2019: Cycle 1 Day 8 of Velcade/Dexamethasone  Interval History:  Kevin Perez 74 y.o. male with medical history significant for newly diagnosed IgG lambda MM who presents for a follow up visit. The patient's last visit was on 07/26/2019 at which time we discussed treatment options moving forward.  On exam today Kevin Perez notes that he is having severe shoulder pain from where he had his fall in the skilled nursing facility.  He reports that it is  8 out of 10 in severity and that the Tylenol medication is not helping with the pain.  He notes that again the pain is quite severe.  He notes that the lower portion of his back where he was previously experiencing pain is no longer problematic.  He also notes that he has had not having pain anywhere else in his body.  He reports that his appetite has been good and he tolerated day 1 of chemotherapy without any adverse effects.  He does occasionally have some headaches, but has otherwise been quite well.  Kevin Perez denies having any fevers, chills, sweats, nausea, vomiting or diarrhea. He notes no residual deficits from his stroke. A full 10 point ROS is listed below.  MEDICAL HISTORY:  Past Medical History:  Diagnosis Date   Cancer ([place])    GERD 07/21/2006   Gout    HYPERLIPIDEMIA 06/17/2009   HYPERTENSION 07/21/2006   Raynaud's syndrome 07/21/2006   Suicide attempt ([place])    Due to Prednisone!   VASCULITIS 05/23/2007    SURGICAL HISTORY: Past Surgical History:  Procedure Laterality Date   HERNIA REPAIR  02/25/29   umbilical hernia   INGUINAL HERNIA REPAIR     Bil   IR VERTEBROPLASTY LUMBAR BX INC UNI/BIL INC/INJECT/IMAGING  05/31/2019    SOCIAL HISTORY: Social History   Socioeconomic History   Marital status: Married    Spouse name: Not on file   Number of children: 2   Years of education: Not on file   Highest education level: Not on file  Occupational History   Occupation: Retired   Tobacco Use   Smoking status: Former Smoker    Packs/day: 0.05    Years: 20.00    Pack years: 1.00  Types: Cigarettes   Smokeless tobacco: Former Systems developer    Types: Chew    Quit date: 06/26/1989   Tobacco comment: chewed tobacco until 89 more than he smoked   Vaping Use   Vaping Use: Never used  Substance and Sexual Activity   Alcohol use: Yes    Alcohol/week: 2.0 standard drinks    Types: 2 Standard drinks or equivalent per week    Comment: with meals on occasion   Drug  use: No   Sexual activity: Not Currently  Other Topics Concern   Not on file  Social History Narrative   Widowed 2019- was Married 1967. 2 kids. 1 grandchild. 1 greatgrandchilden.       Retired 2003 from [employer]      Hobbies: fishing, shagging, exercise, cruise, 92 GTO convertible   Social Determinants of Health   Financial Resource Strain:    Difficulty of Paying Living Expenses:   Food Insecurity:    Worried About Charity fundraiser in the Last Year:    Arboriculturist in the Last Year:   Transportation Needs:    Film/video editor (Medical):    Lack of Transportation (Non-Medical):   Physical Activity:    Days of Exercise per Week:    Minutes of Exercise per Session:   Stress:    Feeling of Stress :   Social Connections:    Frequency of Communication with Friends and Family:    Frequency of Social Gatherings with Friends and Family:    Attends Religious Services:    Active Member of Clubs or Organizations:    Attends Music therapist:    Marital Status:   Intimate Partner Violence:    Fear of Current or Ex-Partner:    Emotionally Abused:    Physically Abused:    Sexually Abused:     FAMILY HISTORY: Family History  Problem Relation Age of Onset   Other Mother        passed age 9   Hypertension Father    Prostate cancer Father 95       never operated, not cause of death   Stroke Father    Meniere's disease Father    Early death Paternal Grandfather    Bladder Cancer Son     ALLERGIES:  is allergic to prednisone, amoxicillin-pot clavulanate, ace inhibitors, and codeine phosphate.  MEDICATIONS:  Current Outpatient Medications  Medication Sig Dispense Refill   acetaminophen (TYLENOL) 500 MG tablet Take 1,000 mg by mouth every 8 (eight) hours as needed for moderate pain.     acyclovir (ZOVIRAX) 400 MG tablet Take 1 tablet (400 mg total) by mouth daily. 90 tablet 1   allopurinol (ZYLOPRIM)  100 MG tablet Take 1 tablet (100 mg total) by mouth daily. 90 tablet 3   aspirin EC 81 MG tablet Take 81 mg by mouth daily.      atorvastatin (LIPITOR) 40 MG tablet Take 1 tablet (40 mg total) by mouth 3 (three) times a week. 90 tablet 3   calcium carbonate (TUMS - DOSED IN MG ELEMENTAL CALCIUM) 500 MG chewable tablet Chew 1 tablet by mouth as needed for indigestion or heartburn. (Patient not taking: Reported on 07/16/2019)     clopidogrel (PLAVIX) 75 MG tablet Take 1 tablet (75 mg total) by mouth daily. 30 tablet 0   mirtazapine (REMERON) 15 MG tablet TAKE 1 TABLET BY MOUTH EVERYDAY AT BEDTIME (Patient taking differently: Take 15 mg by mouth at bedtime. ) 90 tablet 1  oxyCODONE (OXY IR/ROXICODONE) 5 MG immediate release tablet Take 1 tablet (5 mg total) by mouth every 6 (six) hours as needed for severe pain. 5 tablet 0   pantoprazole (PROTONIX) 40 MG tablet Take 1 tablet (40 mg total) by mouth daily. 30 tablet 3   polyethylene glycol (MIRALAX / GLYCOLAX) 17 g packet Take 17 g by mouth daily.     sennosides-docusate sodium (SENOKOT-S) 8.6-50 MG tablet Take 1 tablet by mouth daily as needed for constipation.     tamsulosin (FLOMAX) 0.4 MG CAPS capsule TAKE 1 CAPSULE BY MOUTH EVERY DAY (Patient taking differently: Take 0.4 mg by mouth daily. ) 90 capsule 1   No current facility-administered medications for this visit.    REVIEW OF SYSTEMS:   Constitutional: ( - ) fevers, ( - )  chills , ( - ) night sweats Eyes: ( - ) blurriness of vision, ( - ) double vision, ( - ) watery eyes Ears, nose, mouth, throat, and face: ( - ) mucositis, ( - ) sore throat Respiratory: ( - ) cough, ( - ) dyspnea, ( - ) wheezes Cardiovascular: ( - ) palpitation, ( - ) chest discomfort, ( - ) lower extremity swelling Gastrointestinal:  ( - ) nausea, ( - ) heartburn, ( - ) change in bowel habits Skin: ( - ) abnormal skin rashes Lymphatics: ( - ) new lymphadenopathy, ( - ) easy bruising Neurological: ( - )  numbness, ( - ) tingling, ( - ) new weaknesses Behavioral/Psych: ( - ) mood change, ( - ) new changes  All other systems were reviewed with the patient and are negative.  PHYSICAL EXAMINATION: ECOG PERFORMANCE STATUS: 1 - Symptomatic but completely ambulatory  There were no vitals filed for this visit. There were no vitals filed for this visit.  GENERAL: well appearing elderly Caucasian male. Alert, no distress and comfortable SKIN: skin color, texture, turgor are normal, no rashes or significant lesions EYES: conjunctiva are pink and non-injected, sclera clear LUNGS: some wheezes and course crackles on lung exam HEART: regular rate & rhythm and no murmurs and no lower extremity edema Musculoskeletal: no cyanosis of digits and no clubbing  PSYCH: alert & oriented x 3, fluent speech NEURO: no focal motor/sensory deficits  LABORATORY DATA:  I have reviewed the data as listed CBC Latest Ref Rng & Units 08/06/2019 07/30/2019 07/26/2019  WBC 4.0 - 10.5 K/uL 10.0 9.8 13.1(H)  Hemoglobin 13.0 - 17.0 g/dL 10.7(L) 10.3(L) 11.0(L)  Hematocrit 39 - 52 % 33.3(L) 31.2(L) 33.7(L)  Platelets 150 - 400 K/uL 330 354 362    CMP Latest Ref Rng & Units 08/06/2019 07/30/2019 07/26/2019  Glucose 70 - 99 mg/dL 121(H) 111(H) 150(H)  BUN 8 - 23 mg/dL 29(H) 35(H) 43(H)  Creatinine 0.61 - 1.24 mg/dL 1.10 1.26(H) 1.75(H)  Sodium 135 - 145 mmol/L 143 137 135  Potassium 3.5 - 5.1 mmol/L 4.2 3.5 3.8  Chloride 98 - 111 mmol/L 110 102 94(L)  CO2 22 - 32 mmol/L '23 25 30  '$ Calcium 8.9 - 10.3 mg/dL 8.9 8.7(L) 13.4(HH)  Total Protein 6.5 - 8.1 g/dL 8.2(H) 7.4 8.1  Total Bilirubin 0.3 - 1.2 mg/dL 0.3 0.3 0.3  Alkaline Phos 38 - 126 U/L 138(H) 128(H) 163(H)  AST 15 - 41 U/L '23 18 22  '$ ALT 0 - 44 U/L 20 21 32    No results found for: MPROTEIN Lab Results  Component Value Date   KAPLAMBRATIO 9.97 06/27/2019     RADIOGRAPHIC STUDIES: DG Chest  2 View  Result Date: 07/16/2019 CLINICAL DATA:  CVA EXAM: CHEST - 2 VIEW  COMPARISON:  05/01/2019 FINDINGS: Mild cardiomegaly. Areas of scarring in the left mid lung. Scarring or atelectasis in the lung bases. No effusions or acute bony abnormality. IMPRESSION: Cardiomegaly. Areas of scarring or atelectasis as above. No acute cardiopulmonary disease. Electronically Signed   By: Rolm Baptise M.D.   On: 07/16/2019 17:45   DG Lumbar Spine Complete  Result Date: 07/09/2019 CLINICAL DATA:  Worsening pain, multiple myeloma EXAM: LUMBAR SPINE - COMPLETE 4+ VIEW COMPARISON:  06/06/2019 FINDINGS: Severe osteopenia. Chronic L4 vertebral body compression fracture with prior augmentation and approximately 70% height loss. 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 Severe L1 vertebral body compression fracture with increased height loss compared with 06/06/2019 likely reflecting a acute-subacute fracture superimposed upon a chronic fracture with currently approximately 80% height loss. Degenerative disease with severe disc height loss and endplate sclerosis at H8-I6. Diffuse bilateral facet arthropathy of the lumbar spine. IMPRESSION: 1. Severe L1 vertebral body compression fracture with increased height loss compared with 06/06/2019 likely reflecting an acute-subacute fracture superimposed upon a chronic fracture with currently approximately 80% height loss. 2. 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 Electronically Signed   By: Kathreen Devoid   On: 07/09/2019 11:17   MR ANGIO HEAD WO CONTRAST  Result Date: 07/16/2019 CLINICAL DATA:  Follow-up examination for posterior circulation infarcts. EXAM: MRA HEAD WITHOUT CONTRAST TECHNIQUE: Angiographic images of the Circle of Willis were obtained using MRA technique without intravenous contrast. COMPARISON:  Prior brain MRI from earlier the same day. FINDINGS: ANTERIOR CIRCULATION: Examination degraded by motion artifact. Visualized distal cervical segments of the internal carotid arteries are widely  patent with symmetric antegrade flow. Petrous, cavernous, and supraclinoid ICAs patent without appreciable stenosis or other abnormality. A1 segments patent bilaterally. Grossly normal anterior communicating artery complex. Anterior cerebral arteries patent to their distal aspects without appreciable stenosis. No M1 stenosis or occlusion. Negative MCA bifurcations. Distal MCA branches well perfused and symmetric. POSTERIOR CIRCULATION: Vertebral arteries widely patent to the vertebrobasilar junction without stenosis. Right vertebral artery dominant. Both picas patent. Basilar widely patent to its distal aspect without stenosis. Superior cerebral arteries patent bilaterally. Both PCAs primarily supplied via the basilar and are well perfused to their distal aspects without stenosis. No intracranial aneurysm or other vascular abnormality. IMPRESSION: Negative intracranial MRA. No large vessel occlusion. No hemodynamically significant or correctable stenosis. Electronically Signed   By: Jeannine Boga M.D.   On: 07/16/2019 22:15   MR Brain W and Wo Contrast  Result Date: 07/16/2019 CLINICAL DATA:  Vision loss. 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 History of multiple myeloma. EXAM: MRI HEAD AND ORBITS WITHOUT AND WITH CONTRAST TECHNIQUE: Multiplanar, multiecho pulse sequences of the brain and surrounding structures were obtained without and with intravenous contrast. Multiplanar, multiecho pulse sequences of the orbits and surrounding structures were obtained including fat saturation techniques, before and after intravenous contrast administration. CONTRAST:  59m GADAVIST GADOBUTROL 1 MMOL/ML IV SOLN COMPARISON:  Head CT 07/26/1998 FINDINGS: MRI HEAD FINDINGS Brain: Diffusion imaging shows a punctate acute infarction within the right superior cerebellum. Punctate acute infarction present in the left occipital cortex and at the left parietooccipital junction region. No large confluent  infarction. Elsewhere, brain shows mild chronic small-vessel disease of hemispheric white matter. No mass, hemorrhage, hydrocephalus or extra-axial collection. After contrast administration, no abnormal enhancement occurs. Vascular: Major vessels at the base of the  brain show flow. Skull and upper cervical spine: Negative Other: None MRI ORBITS FINDINGS Orbits: Both globes appear normal. Both optic nerves are normal. Orbital fat is normal. Extra-ocular muscles are normal. No abnormal contrast enhancement. Visualized sinuses: Normal Soft tissues: Normal Limited intracranial: See results of brain MRI. IMPRESSION: MRI orbits: Normal MRI head: Several punctate acute infarctions including 1 in the right cerebellum and 2 to 4 in the left occipital and parietooccipital junction region. 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 No large confluent infarction. No mass effect or hemorrhage. Mild chronic small-vessel ischemic change elsewhere affecting the cerebral hemispheric white matter Electronically Signed   By: Nelson Chimes M.D.   On: 07/16/2019 15:00   ECHOCARDIOGRAM COMPLETE  Result Date: 07/17/2019    ECHOCARDIOGRAM REPORT   Patient Name:   ZYRUS HETLAND Date of Exam: 07/17/2019 Medical Rec #:  [id number]        Height:       66.0 in Accession #:    [id number]       Weight:       118.8 lb Date of Birth:  Apr 23, 1945         BSA:          1.603 m Patient Age:    56 years         BP:           126/65 mmHg Patient Gender: M                HR:           72 bpm. Exam Location:  Inpatient Procedure: 2D Echo Indications:    Stroke 434.91 / I163.9  History:        Patient has no prior history of Echocardiogram examinations.                 Risk Factors:Hypertension and Dyslipidemia. Multiple myeloma,                 Raynaud's syndrome.  Sonographer:    Darlina Sicilian RDCS Referring Phys: [id number] [facility] D NETTEY  Sonographer Comments: Patient is restless and unoriented making exam very  difficult IMPRESSIONS  1. Left ventricular ejection fraction, by estimation, is 60 to 65%. The left ventricle has normal function. The left ventricle has no regional wall motion abnormalities. Left ventricular diastolic parameters are consistent with Grade I diastolic dysfunction (impaired relaxation).  2. Right ventricular systolic function is normal. The right ventricular size is normal.  3. The mitral valve is normal in structure. No evidence of mitral valve regurgitation. No evidence of mitral stenosis.  4. The aortic valve is tricuspid. Aortic valve regurgitation is not visualized. Mild to moderate aortic valve sclerosis/calcification is present, without any evidence of aortic stenosis.  5. The inferior vena cava is normal in size with greater than 50% respiratory variability, suggesting right atrial pressure of 3 mmHg. FINDINGS  Left Ventricle: Left ventricular ejection fraction, by estimation, is 60 to 65%. The left ventricle has normal function. The left ventricle has no regional wall motion abnormalities. The left ventricular internal cavity size was normal in size. There is  no left ventricular hypertrophy. Left ventricular diastolic parameters are consistent with Grade I diastolic dysfunction (impaired relaxation). Normal left ventricular filling pressure. Right Ventricle: The right ventricular size is normal. No increase in right ventricular wall thickness. Right ventricular systolic function is normal. Left Atrium: Left atrial size was normal in size. Right Atrium: Right atrial size was normal in  size. Pericardium: There is no evidence of pericardial effusion. Mitral Valve: The mitral valve is normal in structure. Normal mobility of the mitral valve leaflets. No evidence of mitral valve regurgitation. No evidence of mitral valve stenosis. Tricuspid Valve: The tricuspid valve is normal in structure. Tricuspid valve regurgitation is not demonstrated. No evidence of tricuspid stenosis. Aortic Valve: The  aortic valve is tricuspid. Aortic valve regurgitation is not visualized. Mild to moderate aortic valve sclerosis/calcification is present, without any evidence of aortic stenosis. Pulmonic Valve: The pulmonic valve was normal in structure. Pulmonic valve regurgitation is not visualized. No evidence of pulmonic stenosis. Aorta: The aortic root is normal in size and structure. Venous: The inferior vena cava is normal in size with greater than 50% respiratory variability, suggesting right atrial pressure of 3 mmHg. IAS/Shunts: No atrial level shunt detected by color flow Doppler.  LEFT VENTRICLE PLAX 2D LVIDd:         3.60 cm  Diastology LVIDs:         2.70 cm  LV e' lateral:   9.36 cm/s LV PW:         1.10 cm  LV E/e' lateral: 8.6 LV IVS:        1.00 cm  LV e' medial:    6.31 cm/s LVOT diam:     2.40 cm  LV E/e' medial:  12.8 LVOT Area:     4.52 cm  RIGHT VENTRICLE RV S prime:     11.70 cm/s TAPSE (M-mode): 1.6 cm LEFT ATRIUM           Index LA diam:      2.70 cm 1.68 cm/m LA Vol (A4C): 26.0 ml 16.22 ml/m   AORTA Ao Root diam: 3.30 cm MITRAL VALVE MV Area (PHT): 3.77 cm     SHUNTS MV Decel Time: 201 msec     Systemic Diam: 2.40 cm MV E velocity: 80.60 cm/s MV A velocity: 101.00 cm/s MV E/A ratio:  0.80 Mihai Croitoru MD Electronically signed by Sanda Klein MD Signature Date/Time: 07/17/2019/11:09:10 AM    Final    VAS US CAROTID (at [facility] and [facility] only)  Result Date: 07/18/2019 Carotid Arterial Duplex Study Indications:   CVA. Risk Factors:  Hypertension, hyperlipidemia. Other Factors: Several punctate acute infarctions. Performing Technologist: June Leap RDMS, RVT  Examination Guidelines: A complete evaluation includes B-mode imaging, spectral Doppler, color Doppler, and power Doppler as needed of all accessible portions of each vessel. Bilateral testing is considered an integral part of a complete examination. Limited examinations for reoccurring indications may be performed as noted.  Right Carotid Findings:  +----------+--------+--------+--------+------------------+--------+             PSV cm/s EDV cm/s Stenosis Plaque Description Comments  +----------+--------+--------+--------+------------------+--------+  CCA Prox   86       15                                             +----------+--------+--------+--------+------------------+--------+  CCA Distal 77       15                                             +----------+--------+--------+--------+------------------+--------+  ICA Prox   73       21  1-39%    heterogenous                 +----------+--------+--------+--------+------------------+--------+  ICA Distal 98       24                                             +----------+--------+--------+--------+------------------+--------+  ECA        104      11                                             +----------+--------+--------+--------+------------------+--------+ +----------+--------+-------+----------------+-------------------+             PSV cm/s EDV cms Describe         Arm Pressure (mmHG)  +----------+--------+-------+----------------+-------------------+  Subclavian 119              Multiphasic, WNL                      +----------+--------+-------+----------------+-------------------+ +---------+--------+--+--------+--+---------+  Vertebral PSV cm/s 50 EDV cm/s 14 Antegrade  +---------+--------+--+--------+--+---------+  Left Carotid Findings: +----------+--------+--------+--------+------------------+--------+             PSV cm/s EDV cm/s Stenosis Plaque Description Comments  +----------+--------+--------+--------+------------------+--------+  CCA Prox   130      20                                             +----------+--------+--------+--------+------------------+--------+  CCA Distal 114      17                                             +----------+--------+--------+--------+------------------+--------+  ICA Prox   99       25       1-39%    heterogenous                  +----------+--------+--------+--------+------------------+--------+  ICA Distal 89       20                                             +----------+--------+--------+--------+------------------+--------+  ECA        102      11                                             +----------+--------+--------+--------+------------------+--------+ +----------+--------+--------+----------------+-------------------+             PSV cm/s EDV cm/s Describe         Arm Pressure (mmHG)  +----------+--------+--------+----------------+-------------------+  Subclavian 83                Multiphasic, WNL                      +----------+--------+--------+----------------+-------------------+ +---------+--------+--+--------+--+---------+  Vertebral PSV cm/s 48 EDV  cm/s 12 Antegrade  +---------+--------+--+--------+--+---------+   Summary: Right Carotid: Velocities in the right ICA are consistent with a 1-39% stenosis. Left Carotid: Velocities in the left ICA are consistent with a 1-39% stenosis. Vertebrals:  Bilateral vertebral arteries demonstrate antegrade flow. Subclavians: Normal flow hemodynamics were seen in bilateral subclavian              arteries. *See table(s) above for measurements and observations.  Electronically signed by Antony Contras MD on 07/18/2019 at 9:33:43 AM.    Final    DG ESOPHAGUS W DOUBLE CM (HD)  Result Date: 08/01/2019 CLINICAL DATA:  Oropharyngeal dysphagia for several weeks. Choking and dyspnea after swallowing. EXAM: ESOPHOGRAM/BARIUM SWALLOW TECHNIQUE: Single contrast examination was performed using thin barium. Examination was technically limited due to patient's weakness and inability to stand or move. FLUOROSCOPY TIME:  Fluoroscopy Time:  1 minutes 48 seconds Radiation Exposure Index (if provided by the fluoroscopic device): 11.6 mGy Number of Acquired Spot Images: 0 COMPARISON:  None. FINDINGS: This exam was technically limited due to patient's weakness and inability to stand or roll on fluoroscopy  table. Patient was examined in supine LPO position, as he could not stand erect or lie in decubitus position. No vestibular penetration or aspiration was visualized. No evidence of esophageal mass or stricture. Severe esophageal dysmotility is seen with absence of primary peristalsis and intermittent tertiary contractions. There is no evidence of hiatal hernia, and no gastroesophageal reflux was seen. Slow transit of ingested 13 mm barium tablet was seen due to esophageal dysmotility, however the tablet did pass into the stomach with additional swallows of water and barium. IMPRESSION: Technically limited study. Severe esophageal dysmotility. No other significant abnormality identified. Electronically Signed   By: Marlaine Hind M.D.   On: 08/01/2019 12:34   MR ORBITS W WO CONTRAST  Result Date: 07/16/2019 CLINICAL DATA:  Vision loss. 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 History of multiple myeloma. EXAM: MRI HEAD AND ORBITS WITHOUT AND WITH CONTRAST TECHNIQUE: Multiplanar, multiecho pulse sequences of the brain and surrounding structures were obtained without and with intravenous contrast. Multiplanar, multiecho pulse sequences of the orbits and surrounding structures were obtained including fat saturation techniques, before and after intravenous contrast administration. CONTRAST:  42m GADAVIST GADOBUTROL 1 MMOL/ML IV SOLN COMPARISON:  Head CT 07/26/1998 FINDINGS: MRI HEAD FINDINGS Brain: Diffusion imaging shows a punctate acute infarction within the right superior cerebellum. Punctate acute infarction present in the left occipital cortex and at the left parietooccipital junction region. No large confluent infarction. Elsewhere, brain shows mild chronic small-vessel disease of hemispheric white matter. No mass, hemorrhage, hydrocephalus or extra-axial collection. After contrast administration, no abnormal enhancement occurs. Vascular: Major vessels at the base of the brain show flow. Skull and  upper cervical spine: Negative Other: None MRI ORBITS FINDINGS Orbits: Both globes appear normal. Both optic nerves are normal. Orbital fat is normal. Extra-ocular muscles are normal. No abnormal contrast enhancement. Visualized sinuses: Normal Soft tissues: Normal Limited intracranial: See results of brain MRI. IMPRESSION: MRI orbits: Normal MRI head: Several punctate acute infarctions including 1 in the right cerebellum and 2 to 4 in the left occipital and parietooccipital junction region. 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 No large confluent infarction. No mass effect or hemorrhage. Mild chronic small-vessel ischemic change elsewhere affecting the cerebral hemispheric white matter Electronically Signed   By: MNelson ChimesM.D.   On: 07/16/2019 15:00    ASSESSMENT & PLAN JGarwood WentzellSouthern 74y.o. male  with medical history significant for newly diagnosed IgG lambda MM who presents for a follow up visit.  He is here today for Cycle 1 Day 8 of Velcade/Dex treatment.  Today Mr. Macknight appears to be tolerating therapy well, however his pain from his fall is causing him bilateral shoulder pain.  As such we will get x-rays of her shoulders bilaterally.  Otherwise he is not having any other concerning symptoms at this time.  Labs look good to proceed with treatment.  We are currently on course to add in Revlimid starting with cycle 2.   Treatment plan consists of bortezomib 1.'3mg'$ /m2 on Day 1, 8, and 15, dexamethasone '40mg'$  PO Day 1, 8, and 15, and Lenalidomide '25mg'$  PO day 1-14. Cycles are to be repeated every 21 days and can be continued x 8 cycles. Given his current kidney function will start with Vd and then plan to add revlimid after 1 cycle if well tolerated. Once on VRd this can be reduced to maintenance Vd or Rd if patient has good response. He will also be receiving monthly zometa therapy for lytic bone lesions.   *patient has history of suicide attempt on  prednisone. This was discussed with patient and his son who were unsure what role this medication played in his suicide attempt. This regimen contains dexamethasone, which is essential for his treatment. We will attempt full dose at '40mg'$ , but if psychiatric issues arise we can decrease this.   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 Labs adequate to proceed with treatment.  --. Given his kidney function will administer Cycle 1 of treatment without revlimid and consider the addition later if kidney function improves and the treatment is tolerated.  --details of VRd regimen above (Blood (2010) 116 (23): [id number]).  --additionally today will start monthly zometa as well (first dose on 07/26/2019)  (2 years of duration) --RTC every 2 weeks for continued monitoring  #Lytic Bone Lesions #Compression fracture #Hypercalcemia --patient has received dental clearance for Zometa therapy --zometa administered in clinicon 07/26/2019 at 4g due to hypercalcemia of malignancy (repeat q 4 weeks, next due Aug 2021)  --continue to monitor   #Supportive Care --supportive therapy with acyclovir '400mg'$  BID and ASA '81mg'$  PO daily.  --nausea medication with zofran '8mg'$  PO daily  --zometa as above  #Back Pain, improving #Shoulder Pain, worsening --due to persistent shoulder pain will order bilateral plain films of the shoulders and T spine imaging.  --back pain is currently under control --NS[facility] recommended TLSO brace for comfort. No procedural intervention recommended at this time.  -- 10 mg oxycodone every 6 hours with alternating 1 g Tylenol every 8 hours for pain control.    #Dysphagia --evaluated by GI, thought to be esophageal dysmotility based on radiographic esophageal studies --recommend peppermint to help with spasms.  --appreciate the assistance of GI.  --continue to monitor   Orders Placed This Encounter  Procedures   DG Shoulder Right     Standing Status:   Future    Number of Occurrences:   1    Standing Expiration Date:   08/05/2020    Order Specific Question:   Reason for Exam (SYMPTOM  OR DIAGNOSIS REQUIRED)    Answer:   shoulder pain s/p fall    Order Specific Question:   Preferred imaging location?    Answer:   [facility]    Order Specific Question:   Radiology Contrast Protocol - do NOT  remove file path    Answer:   \[email or web address]   DG Shoulder Left    Standing Status:   Future    Number of Occurrences:   1    Standing Expiration Date:   08/05/2020    Order Specific Question:   Reason for Exam (SYMPTOM  OR DIAGNOSIS REQUIRED)    Answer:   shoulder pain s/p fall    Order Specific Question:   Preferred imaging location?    Answer:   [facility]    Order Specific Question:   Radiology Contrast Protocol - do NOT remove file path    Answer:   \[email or web address]   DG Thoracic Spine 2 View    Standing Status:   Future    Number of Occurrences:   1    Standing Expiration Date:   08/05/2020    Order Specific Question:   Reason for Exam (SYMPTOM  OR DIAGNOSIS REQUIRED)    Answer:   shoulder pain s/p fall    Order Specific Question:   Preferred imaging location?    Answer:   [facility]    Order Specific Question:   Radiology Contrast Protocol - do NOT remove file path    Answer:   \[email or web address]    All questions were answered. The patient knows to call the clinic with any problems, questions or concerns.  A total of more than 30 minutes were spent on this encounter and over half of that time was spent on counseling and coordination of care as outlined above.   Ledell Peoples, MD Department of Hematology/Oncology [place] at [facility] Phone: [phone] Pager: [phone] Email: [email or web address]  08/07/2019 1:42 PM    Bringhen Chauncey Cruel, Charolette Forward, [place], [place], [place], [place] F, [place], [place], Guglielmelli T, [place], [place] V, Rizzo V, Cangial[place] C, Musto P, [place], [place] AM, [place], [place] AP, [place] Kos, Cavo M, Gaidano G, Boccadoro M, Palumbo A. Efficacy and safety of once-weekly bortezomib in multiple myeloma patients. Blood. 2010 Dec 2;116(23):4745-53.  Erratum in: Blood. 2012 Dec 20;120(26):5250.  --Within the limitations of this nonrandomized post-hoc analysis, once-weekly bortezomib seems to be equally effective and better tolerated than the standard twice-weekly schedule. The outcomes and response rate did not appear to be affected by the bortezomib dosing schedule. Durability of response was similar between groups: median PFS was 33.1 months in the once-weekly group and 31.7 months in the twice-weekly group. Overall response rates were 85% and 86%, including CR rates of 30% and 35%, in the once-weekly and twice-weekly groups, respectively.

## 2019-08-08 ENCOUNTER — Telehealth: Payer: Self-pay | Admitting: *Deleted

## 2019-08-08 ENCOUNTER — Telehealth: Payer: Self-pay | Admitting: Hematology and Oncology

## 2019-08-08 DIAGNOSIS — I130 Hypertensive heart and chronic kidney disease with heart failure and stage 1 through stage 4 chronic kidney disease, or unspecified chronic kidney disease: Secondary | ICD-10-CM | POA: Diagnosis not present

## 2019-08-08 DIAGNOSIS — C9000 Multiple myeloma not having achieved remission: Secondary | ICD-10-CM | POA: Diagnosis not present

## 2019-08-08 DIAGNOSIS — N183 Chronic kidney disease, stage 3 unspecified: Secondary | ICD-10-CM | POA: Diagnosis not present

## 2019-08-08 DIAGNOSIS — I677 Cerebral arteritis, not elsewhere classified: Secondary | ICD-10-CM | POA: Diagnosis not present

## 2019-08-08 DIAGNOSIS — I7300 Raynaud's syndrome without gangrene: Secondary | ICD-10-CM | POA: Diagnosis not present

## 2019-08-08 DIAGNOSIS — I5032 Chronic diastolic (congestive) heart failure: Secondary | ICD-10-CM | POA: Diagnosis not present

## 2019-08-08 NOTE — Telephone Encounter (Signed)
Called and gave verbal orders for speech therapy

## 2019-08-08 NOTE — Telephone Encounter (Signed)
Scheduled per los. Called and left msg. Mailed printout  °

## 2019-08-08 NOTE — Telephone Encounter (Signed)
TCT pt's son, Gerald Stabs regarding recent xray results.  Spoke with him and advised that there are no new changes, no new fractures. Gerald Stabs voiced understanding.  He states his dad had a better day today and yesterday, using only Tylenol for pain. Encourage use of oxycodone if pain was more severe and to not let pain get out of control.  He voiced understanding. Aware of appts next week.

## 2019-08-09 ENCOUNTER — Telehealth: Payer: Self-pay | Admitting: Family Medicine

## 2019-08-09 DIAGNOSIS — N183 Chronic kidney disease, stage 3 unspecified: Secondary | ICD-10-CM | POA: Diagnosis not present

## 2019-08-09 DIAGNOSIS — I677 Cerebral arteritis, not elsewhere classified: Secondary | ICD-10-CM | POA: Diagnosis not present

## 2019-08-09 DIAGNOSIS — C9000 Multiple myeloma not having achieved remission: Secondary | ICD-10-CM | POA: Diagnosis not present

## 2019-08-09 DIAGNOSIS — I130 Hypertensive heart and chronic kidney disease with heart failure and stage 1 through stage 4 chronic kidney disease, or unspecified chronic kidney disease: Secondary | ICD-10-CM | POA: Diagnosis not present

## 2019-08-09 DIAGNOSIS — I7300 Raynaud's syndrome without gangrene: Secondary | ICD-10-CM | POA: Diagnosis not present

## 2019-08-09 DIAGNOSIS — I5032 Chronic diastolic (congestive) heart failure: Secondary | ICD-10-CM | POA: Diagnosis not present

## 2019-08-09 NOTE — Telephone Encounter (Signed)
[facility] called and stated patient has stage one pressure ulcer 1 x 2 cm. He is getting up every 2 hours. Family is applying Desitin to it. Lorie requested permission to keep using Desitin and leave it open.

## 2019-08-09 NOTE — Telephone Encounter (Signed)
Please advise 

## 2019-08-10 ENCOUNTER — Telehealth: Payer: Self-pay | Admitting: Family Medicine

## 2019-08-10 DIAGNOSIS — I5032 Chronic diastolic (congestive) heart failure: Secondary | ICD-10-CM | POA: Diagnosis not present

## 2019-08-10 DIAGNOSIS — I130 Hypertensive heart and chronic kidney disease with heart failure and stage 1 through stage 4 chronic kidney disease, or unspecified chronic kidney disease: Secondary | ICD-10-CM | POA: Diagnosis not present

## 2019-08-10 DIAGNOSIS — C9000 Multiple myeloma not having achieved remission: Secondary | ICD-10-CM | POA: Diagnosis not present

## 2019-08-10 DIAGNOSIS — N183 Chronic kidney disease, stage 3 unspecified: Secondary | ICD-10-CM | POA: Diagnosis not present

## 2019-08-10 DIAGNOSIS — I7300 Raynaud's syndrome without gangrene: Secondary | ICD-10-CM | POA: Diagnosis not present

## 2019-08-10 DIAGNOSIS — I677 Cerebral arteritis, not elsewhere classified: Secondary | ICD-10-CM | POA: Diagnosis not present

## 2019-08-10 NOTE — Telephone Encounter (Signed)
Verbal orders given to [facility]

## 2019-08-10 NOTE — Telephone Encounter (Signed)
That is ok.  Algis Greenhouse. Jerline Pain, MD 08/10/2019 9:25 AM

## 2019-08-10 NOTE — Telephone Encounter (Signed)
Speech Therapist from [facility] called requesting verbal orders for pt. Requesting to see pt 1 x a week for 6 weeks. This will be to treat swollowing issues. Able to leave verbal order on voice mail. Please advise.

## 2019-08-10 NOTE — Telephone Encounter (Signed)
Please advise 

## 2019-08-13 ENCOUNTER — Inpatient Hospital Stay: Payer: No Typology Code available for payment source

## 2019-08-13 ENCOUNTER — Other Ambulatory Visit: Payer: Self-pay | Admitting: Hematology and Oncology

## 2019-08-13 ENCOUNTER — Other Ambulatory Visit: Payer: Self-pay

## 2019-08-13 VITALS — BP 134/68 | HR 80 | Temp 98.6°F | Resp 18 | Wt 112.8 lb

## 2019-08-13 DIAGNOSIS — C9000 Multiple myeloma not having achieved remission: Secondary | ICD-10-CM

## 2019-08-13 DIAGNOSIS — I677 Cerebral arteritis, not elsewhere classified: Secondary | ICD-10-CM | POA: Diagnosis not present

## 2019-08-13 DIAGNOSIS — M25511 Pain in right shoulder: Secondary | ICD-10-CM | POA: Diagnosis not present

## 2019-08-13 DIAGNOSIS — I639 Cerebral infarction, unspecified: Secondary | ICD-10-CM | POA: Diagnosis not present

## 2019-08-13 DIAGNOSIS — M25512 Pain in left shoulder: Secondary | ICD-10-CM | POA: Diagnosis not present

## 2019-08-13 DIAGNOSIS — Z5112 Encounter for antineoplastic immunotherapy: Secondary | ICD-10-CM | POA: Diagnosis not present

## 2019-08-13 DIAGNOSIS — N183 Chronic kidney disease, stage 3 unspecified: Secondary | ICD-10-CM | POA: Diagnosis not present

## 2019-08-13 DIAGNOSIS — I130 Hypertensive heart and chronic kidney disease with heart failure and stage 1 through stage 4 chronic kidney disease, or unspecified chronic kidney disease: Secondary | ICD-10-CM | POA: Diagnosis not present

## 2019-08-13 DIAGNOSIS — I5032 Chronic diastolic (congestive) heart failure: Secondary | ICD-10-CM | POA: Diagnosis not present

## 2019-08-13 DIAGNOSIS — I7300 Raynaud's syndrome without gangrene: Secondary | ICD-10-CM | POA: Diagnosis not present

## 2019-08-13 LAB — CBC WITH DIFFERENTIAL (CANCER CENTER ONLY)
Abs Immature Granulocytes: 0.07 10*3/uL (ref 0.00–0.07)
Basophils Absolute: 0 10*3/uL (ref 0.0–0.1)
Basophils Relative: 0 %
Eosinophils Absolute: 0.1 10*3/uL (ref 0.0–0.5)
Eosinophils Relative: 1 %
HCT: 34.6 % — ABNORMAL LOW (ref 39.0–52.0)
Hemoglobin: 11.1 g/dL — ABNORMAL LOW (ref 13.0–17.0)
Immature Granulocytes: 1 %
Lymphocytes Relative: 10 %
Lymphs Abs: 1.3 10*3/uL (ref 0.7–4.0)
MCH: 29.1 pg (ref 26.0–34.0)
MCHC: 32.1 g/dL (ref 30.0–36.0)
MCV: 90.8 fL (ref 80.0–100.0)
Monocytes Absolute: 0.7 10*3/uL (ref 0.1–1.0)
Monocytes Relative: 6 %
Neutro Abs: 10.2 10*3/uL — ABNORMAL HIGH (ref 1.7–7.7)
Neutrophils Relative %: 82 %
Platelet Count: 298 10*3/uL (ref 150–400)
RBC: 3.81 MIL/uL — ABNORMAL LOW (ref 4.22–5.81)
RDW: 13.8 % (ref 11.5–15.5)
WBC Count: 12.3 10*3/uL — ABNORMAL HIGH (ref 4.0–10.5)
nRBC: 0 % (ref 0.0–0.2)

## 2019-08-13 LAB — CMP (CANCER CENTER ONLY)
ALT: 16 U/L (ref 0–44)
AST: 16 U/L (ref 15–41)
Albumin: 3.1 g/dL — ABNORMAL LOW (ref 3.5–5.0)
Alkaline Phosphatase: 156 U/L — ABNORMAL HIGH (ref 38–126)
Anion gap: 11 (ref 5–15)
BUN: 29 mg/dL — ABNORMAL HIGH (ref 8–23)
CO2: 21 mmol/L — ABNORMAL LOW (ref 22–32)
Calcium: 9.2 mg/dL (ref 8.9–10.3)
Chloride: 102 mmol/L (ref 98–111)
Creatinine: 1.01 mg/dL (ref 0.61–1.24)
GFR, Est AFR Am: >60 mL/min (ref >60)
GFR, Estimated: >60 mL/min (ref >60)
Glucose, Bld: 125 mg/dL — ABNORMAL HIGH (ref 70–99)
Potassium: 4.5 mmol/L (ref 3.5–5.1)
Sodium: 134 mmol/L — ABNORMAL LOW (ref 135–145)
Total Bilirubin: 0.3 mg/dL (ref 0.3–1.2)
Total Protein: 8.2 g/dL — ABNORMAL HIGH (ref 6.5–8.1)

## 2019-08-13 LAB — LACTATE DEHYDROGENASE: LDH: 185 U/L (ref 98–192)

## 2019-08-13 MED ORDER — PROCHLORPERAZINE MALEATE 10 MG PO TABS
10.0000 mg | ORAL_TABLET | Freq: Once | ORAL | Status: AC
  Administered 2019-08-13: 10 mg via ORAL

## 2019-08-13 MED ORDER — BORTEZOMIB CHEMO SQ INJECTION 3.5 MG (2.5MG/ML)
2.0000 mg | Freq: Once | INTRAMUSCULAR | Status: AC
  Administered 2019-08-13: 2 mg via SUBCUTANEOUS
  Filled 2019-08-13: qty 0.8

## 2019-08-13 MED ORDER — DEXAMETHASONE 4 MG PO TABS
ORAL_TABLET | ORAL | Status: AC
  Filled 2019-08-13: qty 10

## 2019-08-13 MED ORDER — DEXAMETHASONE 4 MG PO TABS
40.0000 mg | ORAL_TABLET | Freq: Once | ORAL | Status: AC
  Administered 2019-08-13: 40 mg via ORAL

## 2019-08-13 MED ORDER — PROCHLORPERAZINE MALEATE 10 MG PO TABS
ORAL_TABLET | ORAL | Status: AC
  Filled 2019-08-13: qty 1

## 2019-08-13 NOTE — Patient Instructions (Signed)
[facility] Discharge Instructions for Patients Receiving Chemotherapy  Today you received the following chemotherapy agents: bortezomib.  To help prevent nausea and vomiting after your treatment, we encourage you to take your nausea medication as directed.   If you develop nausea and vomiting that is not controlled by your nausea medication, call the clinic.   BELOW ARE SYMPTOMS THAT SHOULD BE REPORTED IMMEDIATELY:  *FEVER GREATER THAN 100.5 F  *CHILLS WITH OR WITHOUT FEVER  NAUSEA AND VOMITING THAT IS NOT CONTROLLED WITH YOUR NAUSEA MEDICATION  *UNUSUAL SHORTNESS OF BREATH  *UNUSUAL BRUISING OR BLEEDING  TENDERNESS IN MOUTH AND THROAT WITH OR WITHOUT PRESENCE OF ULCERS  *URINARY PROBLEMS  *BOWEL PROBLEMS  UNUSUAL RASH Items with * indicate a potential emergency and should be followed up as soon as possible.  Feel free to call the clinic should you have any questions or concerns. The clinic phone number is [phone].  Please show the CHEMO ALERT CARD at check-in to the Emergency Department and triage nurse.   

## 2019-08-13 NOTE — Progress Notes (Signed)
Reduce Velcade dose today to 2 mg based on recent weight loss.  Future orders reduced as well.  Hardie Pulley, PharmD, BCPS, BCOP

## 2019-08-13 NOTE — Telephone Encounter (Signed)
Yes thanks-we just need to keep our visit on July 30 so I can use this as a "face-to-face visit"

## 2019-08-14 ENCOUNTER — Telehealth: Payer: Self-pay

## 2019-08-14 NOTE — Telephone Encounter (Signed)
Nutrition Assessment   Reason for Assessment:  Patient identified on Malnutrition Screening report for weight loss and poor appetite   ASSESSMENT:  74 year old male with new diagnosis of multiple myeloma followed by Dr Lorenso Courier.  Past medical history of GERD, gout, HLD, HTN, suicide attempt with prednisone.  Recent hospital admission with CVA and discharge to SNF for 20 days.  Patient receiving velcade.  Spoke with patient's son, Gerald Stabs via phone to introduce self and service at [facility].  Son reports that patient does not have much of an appetite.  Reports yesterday after receiving chemo appetite has increased.  Reports patient will usually eat 2 eggs, 2 bites of gravy and drink ensure plus.  Usually for lunch says that he feels full and does not want to eat.  Yesterday around 4:30-5pm ate bowl of chicken and dumpling and bite of potatoes.  Around 6:30pm last night son made him a shake of ice cream, peanut butter, protein powder and he drank it all.  Intake better yesterday than it has been.  Son has been pureeing most foods.  Patient has been evaluated by SLP at [facility].  Son reports issues with constipation that got worse during SNF stay.  Reports that since being home constipation is better with senna plus.  Son has started offering meals on smaller plates and cups and patient has done better.     Medications: remeron '15mg'$ .  Son reports PCP prescribed '30mg'$  and patient slept all the time so went back to '15mg'$ .  Son planning to talk with PCP end of this week about other options for appetite stimulant.  Protonix  Labs: glucose 125, BUN 29   Anthropometrics:   Height: 66 inches Weight: 112 lb UBW: 165 lb before wife died in Feb 05, 2017, son has noticed decline since then BMI: 19  32% weight loss in the last 2 1/2 years  7% in the last 3 weeks, significant   Estimated Energy Needs  Kcals: 1500-1700 Protein: 75-85 g Fluid: > 1.5 L   NUTRITION DIAGNOSIS: Unintentional weight loss related to  recent CVA, multiple myeloma, hospital admission and SNF stay as evidenced by 7% weight loss in the last 3 weeks and poor appetite.    INTERVENTION:  Discussed strategies to increase calories and protein.  Will mail High Calorie, High protein education materials and recipes and coupons Encouraged presenting food in smaller amount and offering q 1-2 hours.  Encouraged bowel regimen Encouraged 2-3 ensure plus per day if tolerated Contact information mailed to patient/son.  Son appreciative of phone call as concerned about patient's intake.  Requesting RD talk with patient during infusion as well.      MONITORING, EVALUATION, GOAL: weight trends, intake   Next Visit: Monday, August 16 during infusion ([facility])  Jacqualynn Parco B. Zenia Resides, [place], [place] Registered Dietitian [phone] (mobile)

## 2019-08-15 DIAGNOSIS — C9000 Multiple myeloma not having achieved remission: Secondary | ICD-10-CM | POA: Diagnosis not present

## 2019-08-15 DIAGNOSIS — I7300 Raynaud's syndrome without gangrene: Secondary | ICD-10-CM | POA: Diagnosis not present

## 2019-08-15 DIAGNOSIS — I677 Cerebral arteritis, not elsewhere classified: Secondary | ICD-10-CM | POA: Diagnosis not present

## 2019-08-15 DIAGNOSIS — I5032 Chronic diastolic (congestive) heart failure: Secondary | ICD-10-CM | POA: Diagnosis not present

## 2019-08-15 DIAGNOSIS — I130 Hypertensive heart and chronic kidney disease with heart failure and stage 1 through stage 4 chronic kidney disease, or unspecified chronic kidney disease: Secondary | ICD-10-CM | POA: Diagnosis not present

## 2019-08-15 DIAGNOSIS — N183 Chronic kidney disease, stage 3 unspecified: Secondary | ICD-10-CM | POA: Diagnosis not present

## 2019-08-16 DIAGNOSIS — I130 Hypertensive heart and chronic kidney disease with heart failure and stage 1 through stage 4 chronic kidney disease, or unspecified chronic kidney disease: Secondary | ICD-10-CM | POA: Diagnosis not present

## 2019-08-16 DIAGNOSIS — N183 Chronic kidney disease, stage 3 unspecified: Secondary | ICD-10-CM | POA: Diagnosis not present

## 2019-08-16 DIAGNOSIS — C9000 Multiple myeloma not having achieved remission: Secondary | ICD-10-CM | POA: Diagnosis not present

## 2019-08-16 DIAGNOSIS — I7300 Raynaud's syndrome without gangrene: Secondary | ICD-10-CM | POA: Diagnosis not present

## 2019-08-16 DIAGNOSIS — I677 Cerebral arteritis, not elsewhere classified: Secondary | ICD-10-CM | POA: Diagnosis not present

## 2019-08-16 DIAGNOSIS — I5032 Chronic diastolic (congestive) heart failure: Secondary | ICD-10-CM | POA: Diagnosis not present

## 2019-08-17 ENCOUNTER — Other Ambulatory Visit: Payer: Self-pay

## 2019-08-17 ENCOUNTER — Encounter: Payer: Self-pay | Admitting: Family Medicine

## 2019-08-17 ENCOUNTER — Ambulatory Visit (INDEPENDENT_AMBULATORY_CARE_PROVIDER_SITE_OTHER): Payer: Medicare Other | Admitting: Family Medicine

## 2019-08-17 VITALS — BP 122/60 | HR 82 | Temp 98.3°F | Resp 18 | Ht 66.0 in | Wt 115.0 lb

## 2019-08-17 DIAGNOSIS — I639 Cerebral infarction, unspecified: Secondary | ICD-10-CM

## 2019-08-17 DIAGNOSIS — E7800 Pure hypercholesterolemia, unspecified: Secondary | ICD-10-CM | POA: Diagnosis not present

## 2019-08-17 DIAGNOSIS — K224 Dyskinesia of esophagus: Secondary | ICD-10-CM | POA: Diagnosis not present

## 2019-08-17 DIAGNOSIS — Z8673 Personal history of transient ischemic attack (TIA), and cerebral infarction without residual deficits: Secondary | ICD-10-CM | POA: Diagnosis not present

## 2019-08-17 DIAGNOSIS — I10 Essential (primary) hypertension: Secondary | ICD-10-CM

## 2019-08-17 DIAGNOSIS — C9000 Multiple myeloma not having achieved remission: Secondary | ICD-10-CM

## 2019-08-17 MED ORDER — CLOPIDOGREL BISULFATE 75 MG PO TABS: 75.0000 mg | ORAL_TABLET | Freq: Every day | ORAL | 5 refills | Status: AC

## 2019-08-17 NOTE — Assessment & Plan Note (Signed)
#  History of stroke-presented with blurry vision secondary to several punctate acute infarcts on right cerebellum, left occipital/parietal occipital lobe concerning for microembolic disease.  4-day hospitalization discharged July 2-30-day event monitor planned.  Plan was 3 weeks of Plavix and aspirin together and then Plavix alone.  I refilled the Plavix today-they will drop the aspirin at this point since it's been over 3 weeks.  Also working with PT and finds that helpful. Notes little spots of vision loss minor appearance of smoke/haziness start range and when she'll follow up that never fully resolved but did improve.

## 2019-08-17 NOTE — Assessment & Plan Note (Signed)
#  Multiple myeloma- Lytic bone lesions concerning for multiple myeloma June 19, 2019 CT with chronic back pain after compression fracture likely related to this.  He was ultimately diagnosed with multiple myeloma.  Last visit 08/06/2019 with oncology Dr. Lorenso Courier.  Patient has started chemotherapy.  Was having bilateral shoulder pain at that time requiring x-rays- which were stable.  History of suicide attempt on high-dose steroids-he is on dexamethasone at present so patient is aware he needs to monitor closely for any thoughts of self-harm and seek care immediately if this occurs.  Also dealing with hypercalcemia of malignancy on Zometa.  Also on acyclovir and aspirin.  He also takes Plavix due to stroke as below. Has had 3 treatments and states tuesdays actually are his best days. Getting away with tylenol for pain management (usually twice a day)- also has oxycodone available if needed. Nurse working on pressure sore along with son- seems to be improving- down to dime size.  -thankfully no SI on steroids

## 2019-08-17 NOTE — Patient Instructions (Addendum)
Health Maintenance Due  Topic Date Due   TETANUS/TDAP - due for this if get a cut/scrape 06/18/2019   Stop aspirin. Continue plavix/clopidogrel and follow up with neurology  Continue without blood pressure medicine unless #s consistently above 140/90.   Recommended follow up: Return in about 3 months (around 11/17/2019) for follow up- or sooner if needed.

## 2019-08-17 NOTE — Progress Notes (Signed)
Phone [phone] In person visit   Subjective:   Kevin Perez is a 74 y.o. year old very pleasant male patient who presents for/with See problem oriented charting Chief Complaint  Patient presents with  . Hypertension  . Coronary Artery Disease  . Anorexia    Would like to discuss his Remeron dosage. He was taking 30 mg at the nursing home     This visit occurred during the SARS-CoV-2 public health emergency.  Safety protocols were in place, including screening questions prior to the visit, additional usage of staff PPE, and extensive cleaning of exam room while observing appropriate contact time as indicated for disinfecting solutions.   Past Medical History-  Patient Active Problem List   Diagnosis Date Noted  . Esophageal dysmotility 08/17/2019    Priority: High  . History of CVA (cerebrovascular accident) 07/16/2019    Priority: High  . Multiple myeloma without remission ([place]) 07/03/2019    Priority: High  . Coronary atherosclerosis 06/20/2019    Priority: High  . History of suicide attempt- while on high dose steroids. avoid prednisone.  01/19/2007    Priority: High  . Aortic atherosclerosis ([place]) 06/20/2019    Priority: Medium  . Testicular swelling, left 04/30/2014    Priority: Medium  . BPH associated with nocturia 04/30/2014    Priority: Medium  . Gout 01/08/2014    Priority: Medium  . Insomnia 10/29/2013    Priority: Medium  . Hyperlipidemia 06/17/2009    Priority: Medium  . History of arteritis 05/23/2007    Priority: Medium  . Essential hypertension 07/21/2006    Priority: Medium  . Former smoker 04/30/2014    Priority: Low  . Prepatellar bursitis of right knee 01/22/2014    Priority: Low  . GERD 07/21/2006    Priority: Low  . Hypercalcemia 07/26/2019  . Chronic diastolic CHF (congestive heart failure) ([place]) 07/17/2019  . Hyponatremia 07/16/2019  . Lumbar compression fracture ([place]) 07/16/2019  . CKD (chronic kidney disease), stage III  07/16/2019  . Goals of care, counseling/discussion 06/25/2019  . Protein-calorie malnutrition ([place]) 05/01/2019  . Senile cataracts of both eyes 08/22/2013  . 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 Macular degeneration 02/03/2011    Medications- reviewed and updated Current Outpatient Medications  Medication Sig Dispense Refill  . acetaminophen (TYLENOL) 500 MG tablet Take 1,000 mg by mouth as needed for moderate pain.     Marland Kitchen allopurinol (ZYLOPRIM) 100 MG tablet Take 1 tablet (100 mg total) by mouth daily. 90 tablet 3  . atorvastatin (LIPITOR) 40 MG tablet Take 1 tablet (40 mg total) by mouth 3 (three) times a week. 90 tablet 3  . clopidogrel (PLAVIX) 75 MG tablet Take 1 tablet (75 mg total) by mouth daily. 30 tablet 5  . mirtazapine (REMERON) 15 MG tablet TAKE 1 TABLET BY MOUTH EVERYDAY AT BEDTIME (Patient taking differently: Take 15 mg by mouth at bedtime. ) 90 tablet 1  . pantoprazole (PROTONIX) 40 MG tablet Take 1 tablet (40 mg total) by mouth daily. 30 tablet 3  . polyethylene glycol (MIRALAX / GLYCOLAX) 17 g packet Take 17 g by mouth daily.    . sennosides-docusate sodium (SENOKOT-S) 8.6-50 MG tablet Take 1 tablet by mouth daily as needed for constipation.    . tamsulosin (FLOMAX) 0.4 MG CAPS capsule TAKE 1 CAPSULE BY MOUTH EVERY DAY (Patient taking differently: Take 0.4 mg by mouth daily. ) 90 capsule 1  . acyclovir (ZOVIRAX) 400 MG tablet  Take 1 tablet (400 mg total) by mouth daily. 90 tablet 1  . oxyCODONE (OXY IR/ROXICODONE) 5 MG immediate release tablet Take 1 tablet (5 mg total) by mouth every 6 (six) hours as needed for severe pain. (Patient not taking: Reported on 08/17/2019) 5 tablet 0   No current facility-administered medications for this visit.     Objective:  BP (!) 122/60   Pulse 82   Temp 98.3 F (36.8 C) (Temporal)   Resp 18   Ht '5\' 6"'$  (1.676 m)   Wt 115 lb (52.2 kg)   SpO2 95%   BMI 18.56 kg/m  Gen: NAD,  resting comfortably CV: RRR no murmurs rubs or gallops Lungs: CTAB no crackles, wheeze, rhonchi Abdomen: soft/nontender/nondistended/normal bowel sounds Ext: no edema Skin: warm, dry    Assessment and Plan   #Multiple myeloma- Lytic bone lesions concerning for multiple myeloma June 19, 2019 CT with chronic back pain after compression fracture likely related to this.  He was ultimately diagnosed with multiple myeloma.  Last visit 08/06/2019 with oncology Dr. Lorenso Courier.  Patient has started chemotherapy.  Was having bilateral shoulder pain at that time requiring x-rays.  History of suicide attempt on high-dose steroids-he is on dexamethasone at present so patient is aware he needs to monitor closely for any thoughts of self-harm and seek care immediately if this occurs.  Also dealing with hypercalcemia of malignancy on Zometa.  Also on acyclovir and aspirin.  He also takes Plavix due to stroke as below. Has had 3 treatments and states tuesdays actually are his best days. Getting away with tylenol for pain management (usually twice a day)- also has oxycodone available if needed. Nurse working on pressure sore along with son- seems to be improving- down to dime size  #History of stroke-presented with blurry vision secondary to several punctate acute infarcts on right cerebellum, left occipital/parietal occipital lobe concerning for microembolic disease.  4-day hospitalization discharged July 2-30-day event monitor planned.  Plan was 3 weeks of Plavix and aspirin together and then Plavix alone.  I refilled the Plavix today-they will drop the aspirin at this point since it's been over 3 weeks.  Also working with PT and finds that helpful. Notes little spots of vision loss minor appearance of smoke/haziness start range and when she'll follow up that never fully resolved but did improve.    #Esophageal dysmotility-has been evaluated by GI.  Has been recommended to use peppermint to help with spasms.  Patient is  also working [facility].  This visit can serve as face-to-face visit if needed. - appetite low even with remeron. Tried to come off but needed this.  Wt Readings from Last 3 Encounters:  08/17/19 115 lb (52.2 kg)  08/13/19 112 lb 12 oz (51.1 kg)  08/06/19 118 lb 4 oz (53.6 kg)   #Hypertension S: Compliant with losartan 100 mg--> '50mg'$  with weight loss --> now off   BP Readings from Last 3 Encounters:  08/17/19 (!) 122/60  08/13/19 (!) 134/68  08/06/19 122/60   A/P: Stable. Continue current medications.  Has losartan on hand if needed    #Hyperlipidemia  S: Compliant with atorvastatin 40 mg 3 times a week  Lab Results  Component Value Date   CHOL 118 07/18/2019   HDL 30 (L) 07/18/2019   LDLCALC 67 07/18/2019   LDLDIRECT 94.0 08/10/2017   TRIG 103 07/18/2019   CHOLHDL 3.9 07/18/2019   A/P: LDL goal under 70- continue current meds    #  Gout S: Patient is compliant with allopurinol 100 mg daily.  Has indomethacin on hand if needed- no recent flares A/P: Stable. Continue current medications.    Recommended follow up: Return in about 3 months (around 11/17/2019) for follow up- or sooner if needed. Future Appointments  Date Time Provider [place]  08/20/2019 10:45 AM [facility] LAB/FLUSH [facility] None  08/20/2019 11:00 AM [facility] INFUSION [facility] None  08/20/2019 11:30 AM Ledell Peoples IV, MD [facility] None  08/20/2019 12:30 PM [facility] INFUSION [facility] None  08/27/2019  2:00 PM [facility] LAB 2 [facility] None  08/27/2019  2:15 PM [facility] [place] FLUSH [facility] None  08/27/2019  3:00 PM [facility] INFUSION [facility] None  09/03/2019  2:00 PM [facility] LAB 6 [facility] None  09/03/2019  2:15 PM [facility] [place] FLUSH [facility] None  09/03/2019  3:00 PM [facility] INFUSION [facility] None  09/03/2019  3:15 PM Neff, Barbara L, RD [facility] None  09/10/2019 10:45 AM [facility]/FLUSH [facility] None  09/10/2019 11:00 AM [facility]  [place] FLUSH [facility] None  09/10/2019 11:30 AM Ledell Peoples IV, MD [facility] None  09/10/2019 12:30 PM [facility] INFUSION [facility] None  09/13/2019  9:30 AM Garvin Fila, MD [facility] None  09/17/2019  2:00 PM [facility] LAB 2 [facility] None  09/17/2019  2:15 PM [facility] [place] FLUSH [facility] None  09/17/2019  3:00 PM [facility] INFUSION [facility] None  09/26/2019  2:15 PM [facility] LAB 2 [facility] None  09/26/2019  2:30 PM [facility] [place] FLUSH [facility] None  09/26/2019  3:00 PM Orson Slick, MD [facility] None  09/26/2019  3:30 PM [facility] INFUSION [facility] None  10/03/2019  2:00 PM [facility] LAB 2 [facility] None  10/03/2019  2:15 PM [facility] [place] FLUSH [facility] None  10/03/2019  3:00 PM [facility] INFUSION [facility] None    Lab/Order associations:   ICD-10-CM   1. Pure hypercholesterolemia  E78.00   2. Essential hypertension  I10   3. Esophageal dysmotility  K22.4   4. History of CVA (cerebrovascular accident)  Z86.73   5. Multiple myeloma without remission ([facility])  C90.00     Meds ordered this encounter  Medications  . clopidogrel (PLAVIX) 75 MG tablet    Sig: Take 1 tablet (75 mg total) by mouth daily.    Dispense:  30 tablet    Refill:  5   Return precautions advised.  [name], MD

## 2019-08-17 NOTE — Assessment & Plan Note (Signed)
#  Esophageal dysmotility-has been evaluated by GI.  Has been recommended to use peppermint to help with spasms.  Patient is also working [facility] with [facility].  This visit can serve as face-to-face visit if needed. - appetite low even with remeron. Tried to come off but needed this. I think side effects would be bothersome at higher dose  Wt Readings from Last 3 Encounters:  08/17/19 115 lb (52.2 kg)  08/13/19 112 lb 12 oz (51.1 kg)  08/06/19 118 lb 4 oz (53.6 kg)

## 2019-08-19 ENCOUNTER — Other Ambulatory Visit: Payer: Self-pay | Admitting: Hematology and Oncology

## 2019-08-19 DIAGNOSIS — C9000 Multiple myeloma not having achieved remission: Secondary | ICD-10-CM

## 2019-08-19 NOTE — Progress Notes (Signed)
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 Moderately severe compression fracture of L4 with bone month edema consistent with a more recent fracture. 3) 05/31/2019: patient underwent IR vertebral body augmentation for painful compression fracture at L4 using vertebroplasty technique 4) 06/20/2019: CT C/A/P demonstrated diffuse, heterogeneously lytic, moth-eaten appearance of the osseous structures, most consistent with multiple myeloma. SPEP showed M protein 1.5, Kapp 29.9, Lambda 16.1, Ratio 1.86.  5) 06/25/2019: establish care with Dr. Lorenso Courier  6) 07/03/2019: Bone marrow biopsy confirmed plasma cell myeloma with 30% lambda restricted plasma cells.  7) 07/09/2019: presented for discussion of diagnosis/treatment. Taken to ED for severe 10/10 worsening back pain.  8) 07/16/2019: admitted to [facility] hospital for a CVA, MRI showing several punctate acute infarctions including 1 in the right cerebellum and 2 to 4 in the left occipital and parietooccipital junction region 9) 07/30/2019: Cycle 1 Day 1 of Velcade/Dexamethasone 10) 08/06/2019: Cycle 1 Day 8 of Velcade/Dexamethasone 11) 08/13/2019: Cycle 1 Day 15 of Velcade/Dexamethasone 12) 08/20/2019: Cycle 2 Day 1 of Velcade/Dexamethasone  Interval History:  Kevin Perez 74 y.o. male with medical history significant for IgG lambda MM who presents for a follow up visit. The patient's last visit was on 08/06/2019 at which time we discussed treatment options moving forward.  In the interim since his last visit he has lost 10 pounds, but  otherwise has had no hospitalizations or emergency room visits.  On exam today Kevin Perez notes that he had quite a large bowel movement this morning.  He does have up to 3 bowel movements per day, but they are all fully formed and his son describes him as "tacky".  The patient's son also notes that his energy levels are quite good on Tuesday and Wednesday, but that Thursday he has a drop off where he no longer wants to eat or do the activities that he usually does.  The patient notes that he is "hungry, but not hungry hungry".  He notes that the esophageal spasming is his main barrier to eating.  He notes that his pain has been well controlled on Tylenol and that he has not required much in the way of opioid medication.  He has lost 10 pounds having gone from 121 on 07/26/2018 to 111 on 08/20/2019.  His son reports that he has been drinking 4 ensures a day and is also been trying to keep up with hydration.  Kevin Perez denies having any fevers, chills, sweats, nausea, vomiting or diarrhea. He notes no residual deficits from his stroke. A full 10 point ROS is listed below.  MEDICAL HISTORY:  Past Medical History:  Diagnosis Date   Cancer ([place])    GERD 07/21/2006   Gout    HYPERLIPIDEMIA 06/17/2009   HYPERTENSION 07/21/2006   Raynaud's syndrome 07/21/2006   Suicide attempt ([place])    Due to Prednisone!   VASCULITIS 05/23/2007    SURGICAL HISTORY: Past Surgical History:  Procedure Laterality Date   HERNIA REPAIR  08/19/99   umbilical hernia   INGUINAL HERNIA REPAIR     Bil   IR VERTEBROPLASTY LUMBAR BX INC UNI/BIL INC/INJECT/IMAGING  05/31/2019  SOCIAL HISTORY: Social History   Socioeconomic History   Marital status: Married    Spouse name: Not on file   Number of children: 2   Years of education: Not on file   Highest education level: Not on file  Occupational History   Occupation: Retired   Tobacco Use   Smoking status: Former Smoker    Packs/day: 0.05    Years: 20.00      Pack years: 1.00    Types: Cigarettes   Smokeless tobacco: Former Systems developer    Types: Chew    Quit date: 06/26/1989   Tobacco comment: chewed tobacco until 89 more than he smoked   Vaping Use   Vaping Use: Never used  Substance and Sexual Activity   Alcohol use: Yes    Alcohol/week: 2.0 standard drinks    Types: 2 Standard drinks or equivalent per week    Comment: with meals on occasion   Drug use: No   Sexual activity: Not Currently  Other Topics Concern   Not on file  Social History Narrative   Widowed 2019- was Married 1967. 2 kids. 1 grandchild. 1 greatgrandchilden.       Retired 2003 from [employer]      Hobbies: fishing, shagging, exercise, cruise, 63 GTO convertible   Social Determinants of Health   Financial Resource Strain:    Difficulty of Paying Living Expenses:   Food Insecurity:    Worried About Charity fundraiser in the Last Year:    Arboriculturist in the Last Year:   Transportation Needs:    Film/video editor (Medical):    Lack of Transportation (Non-Medical):   Physical Activity:    Days of Exercise per Week:    Minutes of Exercise per Session:   Stress:    Feeling of Stress :   Social Connections:    Frequency of Communication with Friends and Family:    Frequency of Social Gatherings with Friends and Family:    Attends Religious Services:    Active Member of Clubs or Organizations:    Attends Music therapist:    Marital Status:   Intimate Partner Violence:    Fear of Current or Ex-Partner:    Emotionally Abused:    Physically Abused:    Sexually Abused:     FAMILY HISTORY: Family History  Problem Relation Age of Onset   Other Mother        passed age 41   Hypertension Father    Prostate cancer Father 101       never operated, not cause of death   Stroke Father    Meniere's disease Father    Early death Paternal Grandfather    Bladder Cancer Son     ALLERGIES:  is  allergic to prednisone, amoxicillin-pot clavulanate, ace inhibitors, and codeine phosphate.  MEDICATIONS:  Current Outpatient Medications  Medication Sig Dispense Refill   clonazePAM (KLONOPIN) 1 MG tablet Take 1 mg by mouth 2 (two) times daily as needed for anxiety. 1/2 tab     oxyCODONE (OXY IR/ROXICODONE) 5 MG immediate release tablet Take 1 tablet (5 mg total) by mouth every 6 (six) hours as needed for severe pain. 5 tablet 0   acetaminophen (TYLENOL) 500 MG tablet Take 1,000 mg by mouth as needed for moderate pain.      acyclovir (ZOVIRAX) 400 MG tablet Take 1 tablet (400 mg total) by mouth daily. 90 tablet 1   allopurinol (ZYLOPRIM) 100 MG tablet Take 1  tablet (100 mg total) by mouth daily. 90 tablet 3   atorvastatin (LIPITOR) 40 MG tablet Take 1 tablet (40 mg total) by mouth 3 (three) times a week. 90 tablet 3   clopidogrel (PLAVIX) 75 MG tablet Take 1 tablet (75 mg total) by mouth daily. 30 tablet 5   mirtazapine (REMERON) 15 MG tablet TAKE 1 TABLET BY MOUTH EVERYDAY AT BEDTIME (Patient taking differently: Take 15 mg by mouth at bedtime. ) 90 tablet 1   pantoprazole (PROTONIX) 40 MG tablet Take 1 tablet (40 mg total) by mouth daily. 30 tablet 3   polyethylene glycol (MIRALAX / GLYCOLAX) 17 g packet Take 17 g by mouth daily.     sennosides-docusate sodium (SENOKOT-S) 8.6-50 MG tablet Take 1 tablet by mouth daily as needed for constipation.     tamsulosin (FLOMAX) 0.4 MG CAPS capsule TAKE 1 CAPSULE BY MOUTH EVERY DAY (Patient taking differently: Take 0.4 mg by mouth daily. ) 90 capsule 1   No current facility-administered medications for this visit.    REVIEW OF SYSTEMS:   Constitutional: ( - ) fevers, ( - )  chills , ( - ) night sweats Eyes: ( - ) blurriness of vision, ( - ) double vision, ( - ) watery eyes Ears, nose, mouth, throat, and face: ( - ) mucositis, ( - ) sore throat Respiratory: ( - ) cough, ( - ) dyspnea, ( - ) wheezes Cardiovascular: ( - ) palpitation, ( - )  chest discomfort, ( - ) lower extremity swelling Gastrointestinal:  ( - ) nausea, ( - ) heartburn, ( - ) change in bowel habits Skin: ( - ) abnormal skin rashes Lymphatics: ( - ) new lymphadenopathy, ( - ) easy bruising Neurological: ( - ) numbness, ( - ) tingling, ( - ) new weaknesses Behavioral/Psych: ( - ) mood change, ( - ) new changes  All other systems were reviewed with the patient and are negative.  PHYSICAL EXAMINATION: ECOG PERFORMANCE STATUS: 1 - Symptomatic but completely ambulatory  Vitals:   08/20/19 1154  BP: (!) 153/69  Pulse: 81  Resp: 17  Temp: 97.9 F (36.6 C)  SpO2: 97%   Filed Weights   08/20/19 1154  Weight: 111 lb 11.2 oz (50.7 kg)    GENERAL: well appearing elderly Caucasian male. Alert, no distress and comfortable SKIN: skin color, texture, turgor are normal, no rashes or significant lesions EYES: conjunctiva are pink and non-injected, sclera clear LUNGS: some wheezes and course crackles on lung exam HEART: regular rate & rhythm and no murmurs and no lower extremity edema Musculoskeletal: no cyanosis of digits and no clubbing  PSYCH: alert & oriented x 3, fluent speech NEURO: no focal motor/sensory deficits  LABORATORY DATA:  I have reviewed the data as listed CBC Latest Ref Rng & Units 08/20/2019 08/13/2019 08/06/2019  WBC 4.0 - 10.5 K/uL 14.1(H) 12.3(H) 10.0  Hemoglobin 13.0 - 17.0 g/dL 10.8(L) 11.1(L) 10.7(L)  Hematocrit 39 - 52 % 33.5(L) 34.6(L) 33.3(L)  Platelets 150 - 400 K/uL 301 298 330    CMP Latest Ref Rng & Units 08/20/2019 08/13/2019 08/06/2019  Glucose 70 - 99 mg/dL 113(H) 125(H) 121(H)  BUN 8 - 23 mg/dL 37(H) 29(H) 29(H)  Creatinine 0.61 - 1.24 mg/dL 0.87 1.01 1.10  Sodium 135 - 145 mmol/L 133(L) 134(L) 143  Potassium 3.5 - 5.1 mmol/L 4.7 4.5 4.2  Chloride 98 - 111 mmol/L 102 102 110  CO2 22 - 32 mmol/L 21(L) 21(L) 23  Calcium 8.9 - 10.3  mg/dL 9.4 9.2 8.9  Total Protein 6.5 - 8.1 g/dL 8.0 8.2(H) 8.2(H)  Total Bilirubin 0.3 - 1.2  mg/dL 0.3 0.3 0.3  Alkaline Phos 38 - 126 U/L 176(H) 156(H) 138(H)  AST 15 - 41 U/L _0 ALT 0 - 44 U/L _1 No results found for: MPROTEIN Lab Results  Component Value Date   KAPLAMBRATIO 9.97 06/27/2019     RADIOGRAPHIC STUDIES: DG Thoracic Spine 2 View  Result Date: 08/07/2019 CLINICAL DATA:  Shoulder pain following fall back pain EXAM: THORACIC SPINE 2 VIEWS COMPARISON:  Skeletal survey 07/04/2019, CT chest 06/19/2019 FINDINGS: The osseous structures are diffusely osteopenic limiting sensitivity for detection of interval fracture, particularly within the upper thoracic spine. Anterior wedge compression fracture of T3 is again identified. Wedge compression fractures of T12 is again identified. There is increasing loss of height of the L1 vertebral body when compared to prior CT examination and likely when compared to prior skeletal survey. Diffuse loss of height is seen throughout the remainder of the thoracic vertebra, though these appear grossly stable since prior examination. Focal atelectasis or infiltrate is present within the left lower lobe, new since prior examination. IMPRESSION: Diffusely osteopenic thoracic spine with multiple pre-existing compression fractures. Suspected progressive loss of height involving the L1 vertebral body. Diffuse osteopenia limits sensitivity for acute fracture, and, if clinically suspected, MRI examination would be more sensitive for detection of acute fracture and associated edema. Focal left lower lobe atelectasis or infiltrate, new since prior exam. Electronically Signed   By: Fidela Salisbury MD   On: 08/07/2019 19:40   DG Shoulder Right  Result Date: 08/07/2019 CLINICAL DATA:  Shoulder pain, fall EXAM: RIGHT SHOULDER - 2+ VIEW; LEFT SHOULDER - 2+ VIEW COMPARISON:  CT chest abdomen pelvis, 06/19/2019 FINDINGS: No fracture or dislocation of the bilateral shoulders. There is osteopenia; diffusely heterogeneous, lytic appearance noted on prior  CT is not well distinctly appreciated by radiographs. Moderate, symmetric bilateral acromioclavicular and glenohumeral arthrosis. IMPRESSION: 1. No fracture or dislocation of the bilateral shoulders. 2. There is osteopenia; diffusely heterogeneous, lytic appearance noted on prior CT is not well distinctly appreciated by radiographs. Moderate, symmetric bilateral acromioclavicular and glenohumeral arthrosis. Electronically Signed   By: Eddie Candle M.D.   On: 08/07/2019 18:39   DG Shoulder Left  Result Date: 08/07/2019 CLINICAL DATA:  Shoulder pain, fall EXAM: RIGHT SHOULDER - 2+ VIEW; LEFT SHOULDER - 2+ VIEW COMPARISON:  CT chest abdomen pelvis, 06/19/2019 FINDINGS: No fracture or dislocation of the bilateral shoulders. There is osteopenia; diffusely heterogeneous, lytic appearance noted on prior CT is not well distinctly appreciated by radiographs. Moderate, symmetric bilateral acromioclavicular and glenohumeral arthrosis. IMPRESSION: 1. No fracture or dislocation of the bilateral shoulders. 2. There is osteopenia; diffusely heterogeneous, lytic appearance noted on prior CT is not well distinctly appreciated by radiographs. Moderate, symmetric bilateral acromioclavicular and glenohumeral arthrosis. Electronically Signed   By: Eddie Candle M.D.   On: 08/07/2019 18:39   DG ESOPHAGUS W DOUBLE CM (HD)  Result Date: 08/01/2019 CLINICAL DATA:  Oropharyngeal dysphagia for several weeks. Choking and dyspnea after swallowing. EXAM: ESOPHOGRAM/BARIUM SWALLOW TECHNIQUE: Single contrast examination was performed using thin barium. Examination was technically limited due to patient's weakness and inability to stand or move. FLUOROSCOPY TIME:  Fluoroscopy Time:  1 minutes 48 seconds Radiation Exposure Index (if provided by the fluoroscopic device): 11.6 mGy Number of Acquired Spot Images: 0 COMPARISON:  None. FINDINGS: This exam was technically limited due  to patient's weakness and inability to stand or roll on  fluoroscopy table. Patient was examined in supine LPO position, as he could not stand erect or lie in decubitus position. No vestibular penetration or aspiration was visualized. No evidence of esophageal mass or stricture. Severe esophageal dysmotility is seen with absence of primary peristalsis and intermittent tertiary contractions. There is no evidence of hiatal hernia, and no gastroesophageal reflux was seen. Slow transit of ingested 13 mm barium tablet was seen due to esophageal dysmotility, however the tablet did pass into the stomach with additional swallows of water and barium. IMPRESSION: Technically limited study. Severe esophageal dysmotility. No other significant abnormality identified. Electronically Signed   By: Marlaine Hind M.D.   On: 08/01/2019 12:34    ASSESSMENT & PLAN Kevin Perez 74 y.o. male with medical history significant for newly diagnosed IgG lambda MM who presents for a follow up visit.  He is here today for Cycle 2 Day 1 of Velcade/Dex treatment.  Today Mr. Yoho ports that his biggest concern is weight loss.  He notes that he has lost about 10 pounds since early July.  He currently denies having any issues with pain, nausea, vomiting, or diarrhea.  He is agreeable to continue with treatment at this time.  Also he has tolerated the dexamethasone well without any suicidal thoughts.   Treatment plan consists of bortezomib 1.87m/m2 on Day 1, 8, and 15, dexamethasone 442mPO Day 1, 8, and 15, and Lenalidomide 2583mO day 1-14. Cycles are to be repeated every 21 days and can be continued x 8 cycles. Given his current kidney function will start with Vd and then plan to add revlimid after 1 cycle if well tolerated. Once on VRd this can be reduced to maintenance Vd or Rd if patient has good response. He will also be receiving monthly zometa therapy for lytic bone lesions.   *patient has history of suicide attempt on prednisone. This was discussed with patient and his son who were  unsure what role this medication played in his suicide attempt. This regimen contains dexamethasone, which is essential for his treatment. We will attempt full dose at 42m22mut if psychiatric issues arise we can decrease this.   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 Labs adequate to proceed with treatment.  -- Given his kidney function administered Cycle 1 of treatment without revlimid. Kidney function has improved however due to his continued weight loss and deconditioning the patient and his son would like to hold off until cycle 3.  --will plan to add revlimid starting with Cycle 3 of treatment.  --details of VRd regimen above (Blood (2010) 116 (23): [id number]--additionally continue monthly zometa (first dose on 07/26/2019)  (2 years of duration) --RTC every 2 weeks for continued monitoring (3 week interval due to scheduling/vacation).   #Lytic Bone Lesions #Compression fracture #Hypercalcemia, resolved --patient has received dental clearance for Zometa therapy --zometa administered in clinicon 07/26/2019 at 4g due to hypercalcemia of malignancy. Dose administered today (repeat q 4 weeks, next due Sept 2021)  --continue to monitor   #Supportive Care --supportive therapy with acyclovir 400mg10m and ASA 81mg 37maily.  --nausea medication with zofran 8mg PO29mily  --zometa as above  #Back Pain, improving #Shoulder Pain, improved --due to persistent shoulder pain will order bilateral plain films of the shoulders and T spine imaging.  --back pain is currently under control --NSU recommended TLSO  brace for comfort. No procedural intervention recommended at this time.  -- 10 mg oxycodone every 6 hours with alternating 1 g Tylenol every 8 hours for pain control.    #Dysphagia --evaluated by GI, thought to be esophageal dysmotility based on radiographic esophageal studies --recommend peppermint to help with spasms.  --appreciate  the assistance of GI.  --continue to monitor   No orders of the defined types were placed in this encounter.   All questions were answered. The patient knows to call the clinic with any problems, questions or concerns.  A total of more than 30 minutes were spent on this encounter and over half of that time was spent on counseling and coordination of care as outlined above.   Kevin Peoples, MD Department of Hematology/Oncology [place] [facility] Phone: [phone] Pager: [phone] Email: [email or web address]  08/20/2019 2:52 PM   Bringhen Chauncey Cruel, Dewayne Hatch, [place], [place], [place]arca F, [place], [place], Guglielmelli T, [place], [place] V, Rizzo V, Cangial[place] C, Musto P, [place], [place] AM, [place], [place] AP, [place] Kos, Cavo M, Gaidano G, Boccadoro M, Palumbo A. Efficacy and safety of once-weekly bortezomib in multiple myeloma patients. Blood. 2010 Dec 2;116(23):4745-53.  Erratum in: Blood. 2012 Dec 20;120(26):5250.  --Within the limitations of this nonrandomized post-hoc analysis, once-weekly bortezomib seems to be equally effective and better tolerated than the standard twice-weekly schedule. The outcomes and response rate did not appear to be affected by the bortezomib dosing schedule. Durability of response was similar between groups: median PFS was 33.1 months in the once-weekly group and 31.7 months in the twice-weekly group. Overall response rates were 85% and 86%, including CR rates of 30% and 35%, in the once-weekly and twice-weekly groups, respectively.

## 2019-08-20 ENCOUNTER — Ambulatory Visit: Payer: Medicare Other

## 2019-08-20 ENCOUNTER — Inpatient Hospital Stay: Payer: Medicare Other

## 2019-08-20 ENCOUNTER — Other Ambulatory Visit: Payer: Medicare Other

## 2019-08-20 ENCOUNTER — Inpatient Hospital Stay: Payer: Medicare Other | Attending: Hematology and Oncology | Admitting: Hematology and Oncology

## 2019-08-20 ENCOUNTER — Encounter: Payer: Self-pay | Admitting: Hematology and Oncology

## 2019-08-20 ENCOUNTER — Other Ambulatory Visit: Payer: Self-pay

## 2019-08-20 VITALS — BP 153/69 | HR 81 | Temp 97.9°F | Resp 17 | Ht 66.0 in | Wt 111.7 lb

## 2019-08-20 DIAGNOSIS — C9000 Multiple myeloma not having achieved remission: Secondary | ICD-10-CM

## 2019-08-20 DIAGNOSIS — Z5112 Encounter for antineoplastic immunotherapy: Secondary | ICD-10-CM | POA: Insufficient documentation

## 2019-08-20 DIAGNOSIS — I7300 Raynaud's syndrome without gangrene: Secondary | ICD-10-CM | POA: Diagnosis not present

## 2019-08-20 DIAGNOSIS — S32010A Wedge compression fracture of first lumbar vertebra, initial encounter for closed fracture: Secondary | ICD-10-CM | POA: Diagnosis not present

## 2019-08-20 DIAGNOSIS — I677 Cerebral arteritis, not elsewhere classified: Secondary | ICD-10-CM | POA: Diagnosis not present

## 2019-08-20 DIAGNOSIS — N183 Chronic kidney disease, stage 3 unspecified: Secondary | ICD-10-CM | POA: Diagnosis not present

## 2019-08-20 DIAGNOSIS — I5032 Chronic diastolic (congestive) heart failure: Secondary | ICD-10-CM | POA: Diagnosis not present

## 2019-08-20 DIAGNOSIS — I130 Hypertensive heart and chronic kidney disease with heart failure and stage 1 through stage 4 chronic kidney disease, or unspecified chronic kidney disease: Secondary | ICD-10-CM | POA: Diagnosis not present

## 2019-08-20 LAB — CBC WITH DIFFERENTIAL (CANCER CENTER ONLY)
Abs Immature Granulocytes: 0.14 10*3/uL — ABNORMAL HIGH (ref 0.00–0.07)
Basophils Absolute: 0 10*3/uL (ref 0.0–0.1)
Basophils Relative: 0 %
Eosinophils Absolute: 0 10*3/uL (ref 0.0–0.5)
Eosinophils Relative: 0 %
HCT: 33.5 % — ABNORMAL LOW (ref 39.0–52.0)
Hemoglobin: 10.8 g/dL — ABNORMAL LOW (ref 13.0–17.0)
Immature Granulocytes: 1 %
Lymphocytes Relative: 10 %
Lymphs Abs: 1.3 10*3/uL (ref 0.7–4.0)
MCH: 28.9 pg (ref 26.0–34.0)
MCHC: 32.2 g/dL (ref 30.0–36.0)
MCV: 89.6 fL (ref 80.0–100.0)
Monocytes Absolute: 0.8 10*3/uL (ref 0.1–1.0)
Monocytes Relative: 6 %
Neutro Abs: 11.8 10*3/uL — ABNORMAL HIGH (ref 1.7–7.7)
Neutrophils Relative %: 83 %
Platelet Count: 301 10*3/uL (ref 150–400)
RBC: 3.74 MIL/uL — ABNORMAL LOW (ref 4.22–5.81)
RDW: 14.6 % (ref 11.5–15.5)
WBC Count: 14.1 10*3/uL — ABNORMAL HIGH (ref 4.0–10.5)
nRBC: 0 % (ref 0.0–0.2)

## 2019-08-20 LAB — CMP (CANCER CENTER ONLY)
ALT: 11 U/L (ref 0–44)
AST: 15 U/L (ref 15–41)
Albumin: 3.1 g/dL — ABNORMAL LOW (ref 3.5–5.0)
Alkaline Phosphatase: 176 U/L — ABNORMAL HIGH (ref 38–126)
Anion gap: 10 (ref 5–15)
BUN: 37 mg/dL — ABNORMAL HIGH (ref 8–23)
CO2: 21 mmol/L — ABNORMAL LOW (ref 22–32)
Calcium: 9.4 mg/dL (ref 8.9–10.3)
Chloride: 102 mmol/L (ref 98–111)
Creatinine: 0.87 mg/dL (ref 0.61–1.24)
GFR, Est AFR Am: >60 mL/min (ref >60)
GFR, Estimated: >60 mL/min (ref >60)
Glucose, Bld: 113 mg/dL — ABNORMAL HIGH (ref 70–99)
Potassium: 4.7 mmol/L (ref 3.5–5.1)
Sodium: 133 mmol/L — ABNORMAL LOW (ref 135–145)
Total Bilirubin: 0.3 mg/dL (ref 0.3–1.2)
Total Protein: 8 g/dL (ref 6.5–8.1)

## 2019-08-20 LAB — LACTATE DEHYDROGENASE: LDH: 168 U/L (ref 98–192)

## 2019-08-20 MED ORDER — ZOLEDRONIC ACID 4 MG/100ML IV SOLN
INTRAVENOUS | Status: AC
  Filled 2019-08-20: qty 100

## 2019-08-20 MED ORDER — DEXAMETHASONE 4 MG PO TABS
ORAL_TABLET | ORAL | Status: AC
  Filled 2019-08-20: qty 10

## 2019-08-20 MED ORDER — ZOLEDRONIC ACID 4 MG/5ML IV CONC
3.0000 mg | Freq: Once | INTRAVENOUS | Status: AC
  Administered 2019-08-20: 3 mg via INTRAVENOUS
  Filled 2019-08-20: qty 3.75

## 2019-08-20 MED ORDER — PROCHLORPERAZINE MALEATE 10 MG PO TABS
ORAL_TABLET | ORAL | Status: AC
  Filled 2019-08-20: qty 1

## 2019-08-20 MED ORDER — DEXAMETHASONE 4 MG PO TABS
40.0000 mg | ORAL_TABLET | Freq: Once | ORAL | Status: AC
  Administered 2019-08-20: 40 mg via ORAL

## 2019-08-20 MED ORDER — BORTEZOMIB CHEMO SQ INJECTION 3.5 MG (2.5MG/ML)
1.3000 mg/m2 | Freq: Once | INTRAMUSCULAR | Status: AC
  Administered 2019-08-20: 2 mg via SUBCUTANEOUS
  Filled 2019-08-20: qty 0.8

## 2019-08-20 MED ORDER — PROCHLORPERAZINE MALEATE 10 MG PO TABS
10.0000 mg | ORAL_TABLET | Freq: Once | ORAL | Status: AC
  Administered 2019-08-20: 10 mg via ORAL

## 2019-08-20 MED ORDER — SODIUM CHLORIDE 0.9 % IV SOLN
Freq: Once | INTRAVENOUS | Status: AC
  Filled 2019-08-20: qty 250

## 2019-08-20 NOTE — Patient Instructions (Signed)
[facility] Discharge Instructions for Patients Receiving Chemotherapy  Today you received the following chemotherapy agent: bortezomib (Velcade)  To help prevent nausea and vomiting after your treatment, we encourage you to take your nausea medication as directed.   If you develop nausea and vomiting that is not controlled by your nausea medication, call the clinic.   BELOW ARE SYMPTOMS THAT SHOULD BE REPORTED IMMEDIATELY:  *FEVER GREATER THAN 100.5 F  *CHILLS WITH OR WITHOUT FEVER  NAUSEA AND VOMITING THAT IS NOT CONTROLLED WITH YOUR NAUSEA MEDICATION  *UNUSUAL SHORTNESS OF BREATH  *UNUSUAL BRUISING OR BLEEDING  TENDERNESS IN MOUTH AND THROAT WITH OR WITHOUT PRESENCE OF ULCERS  *URINARY PROBLEMS  *BOWEL PROBLEMS  UNUSUAL RASH Items with * indicate a potential emergency and should be followed up as soon as possible.  Feel free to call the clinic should you have any questions or concerns. The clinic phone number is [phone].  Please show the [facility] at check-in to the Emergency Department and triage nurse.     Zoledronic Acid injection (Hypercalcemia, Oncology) What is this medicine? ZOLEDRONIC ACID (ZOE le dron ik AS id) lowers the amount of calcium loss from bone. It is used to treat too much calcium in your blood from cancer. It is also used to prevent complications of cancer that has spread to the bone. This medicine may be used for other purposes; ask your health care provider or pharmacist if you have questions. COMMON BRAND NAME(S): Zometa What should I tell my health care provider before I take this medicine? They need to know if you have any of these conditions:  aspirin-sensitive asthma  cancer, especially if you are receiving medicines used to treat cancer  dental disease or wear dentures  infection  kidney disease  receiving corticosteroids like dexamethasone or prednisone  an unusual or allergic reaction to zoledronic  acid, other medicines, foods, dyes, or preservatives  pregnant or trying to get pregnant  breast-feeding How should I use this medicine? This medicine is for infusion into a vein. It is given by a health care professional in a hospital or clinic setting. Talk to your pediatrician regarding the use of this medicine in children. Special care may be needed. Overdosage: If you think you have taken too much of this medicine contact a poison control center or emergency room at once. NOTE: This medicine is only for you. Do not share this medicine with others. What if I miss a dose? It is important not to miss your dose. Call your doctor or health care professional if you are unable to keep an appointment. What may interact with this medicine?  certain antibiotics given by injection  NSAIDs, medicines for pain and inflammation, like ibuprofen or naproxen  some diuretics like bumetanide, furosemide  teriparatide  thalidomide This list may not describe all possible interactions. Give your health care provider a list of all the medicines, herbs, non-prescription drugs, or dietary supplements you use. Also tell them if you smoke, drink alcohol, or use illegal drugs. Some items may interact with your medicine. What should I watch for while using this medicine? Visit your doctor or health care professional for regular checkups. It may be some time before you see the benefit from this medicine. Do not stop taking your medicine unless your doctor tells you to. Your doctor may order blood tests or other tests to see how you are doing. Women should inform their doctor if they wish to become pregnant or think  they might be pregnant. There is a potential for serious side effects to an unborn child. Talk to your health care professional or pharmacist for more information. You should make sure that you get enough calcium and vitamin D while you are taking this medicine. Discuss the foods you eat and the  vitamins you take with your health care professional. Some people who take this medicine have severe bone, joint, and/or muscle pain. This medicine may also increase your risk for jaw problems or a broken thigh bone. Tell your doctor right away if you have severe pain in your jaw, bones, joints, or muscles. Tell your doctor if you have any pain that does not go away or that gets worse. Tell your dentist and dental surgeon that you are taking this medicine. You should not have major dental surgery while on this medicine. See your dentist to have a dental exam and fix any dental problems before starting this medicine. Take good care of your teeth while on this medicine. Make sure you see your dentist for regular follow-up appointments. What side effects may I notice from receiving this medicine? Side effects that you should report to your doctor or health care professional as soon as possible:  allergic reactions like skin rash, itching or hives, swelling of the face, lips, or tongue  anxiety, confusion, or depression  breathing problems  changes in vision  eye pain  feeling faint or lightheaded, falls  jaw pain, especially after dental work  mouth sores  muscle cramps, stiffness, or weakness  redness, blistering, peeling or loosening of the skin, including inside the mouth  trouble passing urine or change in the amount of urine Side effects that usually do not require medical attention (report to your doctor or health care professional if they continue or are bothersome):  bone, joint, or muscle pain  constipation  diarrhea  fever  hair loss  irritation at site where injected  loss of appetite  nausea, vomiting  stomach upset  trouble sleeping  trouble swallowing  weak or tired This list may not describe all possible side effects. Call your doctor for medical advice about side effects. You may report side effects to FDA at 1-800-FDA-1088. Where should I keep my  medicine? This drug is given in a hospital or clinic and will not be stored at home. NOTE: This sheet is a summary. It may not cover all possible information. If you have questions about this medicine, talk to your doctor, pharmacist, or health care provider.  2020 [employer] Standard (2013-06-02 14:19:39)

## 2019-08-22 DIAGNOSIS — I130 Hypertensive heart and chronic kidney disease with heart failure and stage 1 through stage 4 chronic kidney disease, or unspecified chronic kidney disease: Secondary | ICD-10-CM | POA: Diagnosis not present

## 2019-08-22 DIAGNOSIS — I5032 Chronic diastolic (congestive) heart failure: Secondary | ICD-10-CM | POA: Diagnosis not present

## 2019-08-22 DIAGNOSIS — C9000 Multiple myeloma not having achieved remission: Secondary | ICD-10-CM | POA: Diagnosis not present

## 2019-08-22 DIAGNOSIS — I677 Cerebral arteritis, not elsewhere classified: Secondary | ICD-10-CM | POA: Diagnosis not present

## 2019-08-22 DIAGNOSIS — N183 Chronic kidney disease, stage 3 unspecified: Secondary | ICD-10-CM | POA: Diagnosis not present

## 2019-08-22 DIAGNOSIS — I7300 Raynaud's syndrome without gangrene: Secondary | ICD-10-CM | POA: Diagnosis not present

## 2019-08-23 DIAGNOSIS — C9000 Multiple myeloma not having achieved remission: Secondary | ICD-10-CM | POA: Diagnosis not present

## 2019-08-23 DIAGNOSIS — I677 Cerebral arteritis, not elsewhere classified: Secondary | ICD-10-CM | POA: Diagnosis not present

## 2019-08-23 DIAGNOSIS — I5032 Chronic diastolic (congestive) heart failure: Secondary | ICD-10-CM | POA: Diagnosis not present

## 2019-08-23 DIAGNOSIS — N183 Chronic kidney disease, stage 3 unspecified: Secondary | ICD-10-CM | POA: Diagnosis not present

## 2019-08-23 DIAGNOSIS — I130 Hypertensive heart and chronic kidney disease with heart failure and stage 1 through stage 4 chronic kidney disease, or unspecified chronic kidney disease: Secondary | ICD-10-CM | POA: Diagnosis not present

## 2019-08-23 DIAGNOSIS — I7300 Raynaud's syndrome without gangrene: Secondary | ICD-10-CM | POA: Diagnosis not present

## 2019-08-27 ENCOUNTER — Other Ambulatory Visit: Payer: Self-pay | Admitting: *Deleted

## 2019-08-27 ENCOUNTER — Inpatient Hospital Stay: Payer: Medicare Other

## 2019-08-27 ENCOUNTER — Other Ambulatory Visit: Payer: Self-pay

## 2019-08-27 ENCOUNTER — Other Ambulatory Visit: Payer: Medicare Other

## 2019-08-27 ENCOUNTER — Ambulatory Visit: Payer: Medicare Other | Admitting: Family Medicine

## 2019-08-27 VITALS — BP 136/64 | HR 81 | Temp 98.4°F | Resp 20 | Ht 66.0 in | Wt 118.0 lb

## 2019-08-27 DIAGNOSIS — C9000 Multiple myeloma not having achieved remission: Secondary | ICD-10-CM

## 2019-08-27 DIAGNOSIS — Z5112 Encounter for antineoplastic immunotherapy: Secondary | ICD-10-CM | POA: Diagnosis not present

## 2019-08-27 LAB — CMP (CANCER CENTER ONLY)
ALT: 24 U/L (ref 0–44)
AST: 18 U/L (ref 15–41)
Albumin: 3 g/dL — ABNORMAL LOW (ref 3.5–5.0)
Alkaline Phosphatase: 183 U/L — ABNORMAL HIGH (ref 38–126)
Anion gap: 10 (ref 5–15)
BUN: 38 mg/dL — ABNORMAL HIGH (ref 8–23)
CO2: 21 mmol/L — ABNORMAL LOW (ref 22–32)
Calcium: 9.2 mg/dL (ref 8.9–10.3)
Chloride: 103 mmol/L (ref 98–111)
Creatinine: 0.96 mg/dL (ref 0.61–1.24)
GFR, Est AFR Am: >60 mL/min (ref >60)
GFR, Estimated: >60 mL/min (ref >60)
Glucose, Bld: 118 mg/dL — ABNORMAL HIGH (ref 70–99)
Potassium: 4.9 mmol/L (ref 3.5–5.1)
Sodium: 134 mmol/L — ABNORMAL LOW (ref 135–145)
Total Bilirubin: 0.3 mg/dL (ref 0.3–1.2)
Total Protein: 7.9 g/dL (ref 6.5–8.1)

## 2019-08-27 LAB — CBC WITH DIFFERENTIAL (CANCER CENTER ONLY)
Abs Immature Granulocytes: 0.12 10*3/uL — ABNORMAL HIGH (ref 0.00–0.07)
Basophils Absolute: 0 10*3/uL (ref 0.0–0.1)
Basophils Relative: 0 %
Eosinophils Absolute: 0.1 10*3/uL (ref 0.0–0.5)
Eosinophils Relative: 1 %
HCT: 33.8 % — ABNORMAL LOW (ref 39.0–52.0)
Hemoglobin: 10.9 g/dL — ABNORMAL LOW (ref 13.0–17.0)
Immature Granulocytes: 1 %
Lymphocytes Relative: 10 %
Lymphs Abs: 1.3 10*3/uL (ref 0.7–4.0)
MCH: 29.6 pg (ref 26.0–34.0)
MCHC: 32.2 g/dL (ref 30.0–36.0)
MCV: 91.8 fL (ref 80.0–100.0)
Monocytes Absolute: 1 10*3/uL (ref 0.1–1.0)
Monocytes Relative: 8 %
Neutro Abs: 10.3 10*3/uL — ABNORMAL HIGH (ref 1.7–7.7)
Neutrophils Relative %: 80 %
Platelet Count: 202 10*3/uL (ref 150–400)
RBC: 3.68 MIL/uL — ABNORMAL LOW (ref 4.22–5.81)
RDW: 15.4 % (ref 11.5–15.5)
WBC Count: 12.9 10*3/uL — ABNORMAL HIGH (ref 4.0–10.5)
nRBC: 0 % (ref 0.0–0.2)

## 2019-08-27 LAB — LACTATE DEHYDROGENASE: LDH: 181 U/L (ref 98–192)

## 2019-08-27 MED ORDER — PROCHLORPERAZINE MALEATE 10 MG PO TABS
10.0000 mg | ORAL_TABLET | Freq: Once | ORAL | Status: AC
  Administered 2019-08-27: 10 mg via ORAL

## 2019-08-27 MED ORDER — PROCHLORPERAZINE MALEATE 10 MG PO TABS
ORAL_TABLET | ORAL | Status: AC
  Filled 2019-08-27: qty 1

## 2019-08-27 MED ORDER — BORTEZOMIB CHEMO SQ INJECTION 3.5 MG (2.5MG/ML)
1.3000 mg/m2 | Freq: Once | INTRAMUSCULAR | Status: AC
  Administered 2019-08-27: 2 mg via SUBCUTANEOUS
  Filled 2019-08-27: qty 0.8

## 2019-08-27 MED ORDER — DEXAMETHASONE 4 MG PO TABS
ORAL_TABLET | ORAL | Status: AC
  Filled 2019-08-27: qty 10

## 2019-08-27 MED ORDER — DEXAMETHASONE 4 MG PO TABS
40.0000 mg | ORAL_TABLET | Freq: Once | ORAL | Status: AC
  Administered 2019-08-27: 40 mg via ORAL

## 2019-08-27 NOTE — Patient Instructions (Signed)
[facility] Discharge Instructions for Patients Receiving Chemotherapy  Today you received the following chemotherapy agent: Velcade  To help prevent nausea and vomiting after your treatment, we encourage you to take your nausea medication as directed by your MD.   If you develop nausea and vomiting that is not controlled by your nausea medication, call the clinic.   BELOW ARE SYMPTOMS THAT SHOULD BE REPORTED IMMEDIATELY:  *FEVER GREATER THAN 100.5 F  *CHILLS WITH OR WITHOUT FEVER  NAUSEA AND VOMITING THAT IS NOT CONTROLLED WITH YOUR NAUSEA MEDICATION  *UNUSUAL SHORTNESS OF BREATH  *UNUSUAL BRUISING OR BLEEDING  TENDERNESS IN MOUTH AND THROAT WITH OR WITHOUT PRESENCE OF ULCERS  *URINARY PROBLEMS  *BOWEL PROBLEMS  UNUSUAL RASH Items with * indicate a potential emergency and should be followed up as soon as possible.  Feel free to call the clinic should you have any questions or concerns. The clinic phone number is [phone].  Please show the CHEMO ALERT CARD at check-in to the Emergency Department and triage nurse.   

## 2019-08-28 ENCOUNTER — Other Ambulatory Visit: Payer: Self-pay | Admitting: Family Medicine

## 2019-08-28 DIAGNOSIS — I5032 Chronic diastolic (congestive) heart failure: Secondary | ICD-10-CM | POA: Diagnosis not present

## 2019-08-28 DIAGNOSIS — I7300 Raynaud's syndrome without gangrene: Secondary | ICD-10-CM | POA: Diagnosis not present

## 2019-08-28 DIAGNOSIS — I130 Hypertensive heart and chronic kidney disease with heart failure and stage 1 through stage 4 chronic kidney disease, or unspecified chronic kidney disease: Secondary | ICD-10-CM | POA: Diagnosis not present

## 2019-08-28 DIAGNOSIS — N183 Chronic kidney disease, stage 3 unspecified: Secondary | ICD-10-CM | POA: Diagnosis not present

## 2019-08-28 DIAGNOSIS — C9000 Multiple myeloma not having achieved remission: Secondary | ICD-10-CM | POA: Diagnosis not present

## 2019-08-28 DIAGNOSIS — I677 Cerebral arteritis, not elsewhere classified: Secondary | ICD-10-CM | POA: Diagnosis not present

## 2019-08-29 ENCOUNTER — Encounter: Payer: Self-pay | Admitting: Neurology

## 2019-08-30 DIAGNOSIS — N183 Chronic kidney disease, stage 3 unspecified: Secondary | ICD-10-CM | POA: Diagnosis not present

## 2019-08-30 DIAGNOSIS — I5032 Chronic diastolic (congestive) heart failure: Secondary | ICD-10-CM | POA: Diagnosis not present

## 2019-08-30 DIAGNOSIS — I677 Cerebral arteritis, not elsewhere classified: Secondary | ICD-10-CM | POA: Diagnosis not present

## 2019-08-30 DIAGNOSIS — I7300 Raynaud's syndrome without gangrene: Secondary | ICD-10-CM | POA: Diagnosis not present

## 2019-08-30 DIAGNOSIS — C9000 Multiple myeloma not having achieved remission: Secondary | ICD-10-CM | POA: Diagnosis not present

## 2019-08-30 DIAGNOSIS — I130 Hypertensive heart and chronic kidney disease with heart failure and stage 1 through stage 4 chronic kidney disease, or unspecified chronic kidney disease: Secondary | ICD-10-CM | POA: Diagnosis not present

## 2019-08-31 ENCOUNTER — Other Ambulatory Visit: Payer: Self-pay | Admitting: *Deleted

## 2019-08-31 DIAGNOSIS — C9000 Multiple myeloma not having achieved remission: Secondary | ICD-10-CM

## 2019-09-03 ENCOUNTER — Other Ambulatory Visit: Payer: Medicare Other

## 2019-09-03 ENCOUNTER — Inpatient Hospital Stay: Payer: Medicare Other | Admitting: Nutrition

## 2019-09-03 ENCOUNTER — Other Ambulatory Visit: Payer: Self-pay

## 2019-09-03 ENCOUNTER — Inpatient Hospital Stay: Payer: Medicare Other

## 2019-09-03 VITALS — BP 120/62 | HR 81 | Temp 97.8°F | Resp 20 | Wt 118.6 lb

## 2019-09-03 DIAGNOSIS — R1310 Dysphagia, unspecified: Secondary | ICD-10-CM | POA: Diagnosis not present

## 2019-09-03 DIAGNOSIS — L89311 Pressure ulcer of right buttock, stage 1: Secondary | ICD-10-CM | POA: Diagnosis not present

## 2019-09-03 DIAGNOSIS — E785 Hyperlipidemia, unspecified: Secondary | ICD-10-CM | POA: Diagnosis not present

## 2019-09-03 DIAGNOSIS — N400 Enlarged prostate without lower urinary tract symptoms: Secondary | ICD-10-CM | POA: Diagnosis not present

## 2019-09-03 DIAGNOSIS — Z5112 Encounter for antineoplastic immunotherapy: Secondary | ICD-10-CM | POA: Diagnosis not present

## 2019-09-03 DIAGNOSIS — I677 Cerebral arteritis, not elsewhere classified: Secondary | ICD-10-CM | POA: Diagnosis not present

## 2019-09-03 DIAGNOSIS — I130 Hypertensive heart and chronic kidney disease with heart failure and stage 1 through stage 4 chronic kidney disease, or unspecified chronic kidney disease: Secondary | ICD-10-CM | POA: Diagnosis not present

## 2019-09-03 DIAGNOSIS — F419 Anxiety disorder, unspecified: Secondary | ICD-10-CM | POA: Diagnosis not present

## 2019-09-03 DIAGNOSIS — K219 Gastro-esophageal reflux disease without esophagitis: Secondary | ICD-10-CM | POA: Diagnosis not present

## 2019-09-03 DIAGNOSIS — Z87311 Personal history of (healed) other pathological fracture: Secondary | ICD-10-CM | POA: Diagnosis not present

## 2019-09-03 DIAGNOSIS — Z981 Arthrodesis status: Secondary | ICD-10-CM | POA: Diagnosis not present

## 2019-09-03 DIAGNOSIS — I7300 Raynaud's syndrome without gangrene: Secondary | ICD-10-CM | POA: Diagnosis not present

## 2019-09-03 DIAGNOSIS — Z87891 Personal history of nicotine dependence: Secondary | ICD-10-CM | POA: Diagnosis not present

## 2019-09-03 DIAGNOSIS — K5900 Constipation, unspecified: Secondary | ICD-10-CM | POA: Diagnosis not present

## 2019-09-03 DIAGNOSIS — C9000 Multiple myeloma not having achieved remission: Secondary | ICD-10-CM

## 2019-09-03 DIAGNOSIS — M109 Gout, unspecified: Secondary | ICD-10-CM | POA: Diagnosis not present

## 2019-09-03 DIAGNOSIS — I5032 Chronic diastolic (congestive) heart failure: Secondary | ICD-10-CM | POA: Diagnosis not present

## 2019-09-03 DIAGNOSIS — Z915 Personal history of self-harm: Secondary | ICD-10-CM | POA: Diagnosis not present

## 2019-09-03 DIAGNOSIS — H269 Unspecified cataract: Secondary | ICD-10-CM | POA: Diagnosis not present

## 2019-09-03 DIAGNOSIS — Z8673 Personal history of transient ischemic attack (TIA), and cerebral infarction without residual deficits: Secondary | ICD-10-CM | POA: Diagnosis not present

## 2019-09-03 DIAGNOSIS — Z7982 Long term (current) use of aspirin: Secondary | ICD-10-CM | POA: Diagnosis not present

## 2019-09-03 DIAGNOSIS — Z7902 Long term (current) use of antithrombotics/antiplatelets: Secondary | ICD-10-CM | POA: Diagnosis not present

## 2019-09-03 DIAGNOSIS — N183 Chronic kidney disease, stage 3 unspecified: Secondary | ICD-10-CM | POA: Diagnosis not present

## 2019-09-03 DIAGNOSIS — M545 Low back pain: Secondary | ICD-10-CM | POA: Diagnosis not present

## 2019-09-03 LAB — CMP (CANCER CENTER ONLY)
ALT: 35 U/L (ref 0–44)
AST: 22 U/L (ref 15–41)
Albumin: 3 g/dL — ABNORMAL LOW (ref 3.5–5.0)
Alkaline Phosphatase: 196 U/L — ABNORMAL HIGH (ref 38–126)
Anion gap: 8 (ref 5–15)
BUN: 41 mg/dL — ABNORMAL HIGH (ref 8–23)
CO2: 22 mmol/L (ref 22–32)
Calcium: 9.7 mg/dL (ref 8.9–10.3)
Chloride: 107 mmol/L (ref 98–111)
Creatinine: 0.93 mg/dL (ref 0.61–1.24)
GFR, Est AFR Am: >60 mL/min (ref >60)
GFR, Estimated: >60 mL/min (ref >60)
Glucose, Bld: 109 mg/dL — ABNORMAL HIGH (ref 70–99)
Potassium: 4.8 mmol/L (ref 3.5–5.1)
Sodium: 137 mmol/L (ref 135–145)
Total Bilirubin: 0.3 mg/dL (ref 0.3–1.2)
Total Protein: 8 g/dL (ref 6.5–8.1)

## 2019-09-03 LAB — CBC WITH DIFFERENTIAL (CANCER CENTER ONLY)
Abs Immature Granulocytes: 0.11 10*3/uL — ABNORMAL HIGH (ref 0.00–0.07)
Basophils Absolute: 0 10*3/uL (ref 0.0–0.1)
Basophils Relative: 0 %
Eosinophils Absolute: 0.1 10*3/uL (ref 0.0–0.5)
Eosinophils Relative: 1 %
HCT: 35.9 % — ABNORMAL LOW (ref 39.0–52.0)
Hemoglobin: 11.3 g/dL — ABNORMAL LOW (ref 13.0–17.0)
Immature Granulocytes: 1 %
Lymphocytes Relative: 13 %
Lymphs Abs: 1.5 10*3/uL (ref 0.7–4.0)
MCH: 29.4 pg (ref 26.0–34.0)
MCHC: 31.5 g/dL (ref 30.0–36.0)
MCV: 93.5 fL (ref 80.0–100.0)
Monocytes Absolute: 1 10*3/uL (ref 0.1–1.0)
Monocytes Relative: 8 %
Neutro Abs: 9 10*3/uL — ABNORMAL HIGH (ref 1.7–7.7)
Neutrophils Relative %: 77 %
Platelet Count: 263 10*3/uL (ref 150–400)
RBC: 3.84 MIL/uL — ABNORMAL LOW (ref 4.22–5.81)
RDW: 16.1 % — ABNORMAL HIGH (ref 11.5–15.5)
WBC Count: 11.8 10*3/uL — ABNORMAL HIGH (ref 4.0–10.5)
nRBC: 0 % (ref 0.0–0.2)

## 2019-09-03 MED ORDER — PROCHLORPERAZINE MALEATE 10 MG PO TABS
10.0000 mg | ORAL_TABLET | Freq: Once | ORAL | Status: AC
  Administered 2019-09-03: 10 mg via ORAL

## 2019-09-03 MED ORDER — DEXAMETHASONE 4 MG PO TABS
ORAL_TABLET | ORAL | Status: AC
  Filled 2019-09-03: qty 10

## 2019-09-03 MED ORDER — DEXAMETHASONE 4 MG PO TABS
40.0000 mg | ORAL_TABLET | Freq: Once | ORAL | Status: AC
  Administered 2019-09-03: 40 mg via ORAL

## 2019-09-03 MED ORDER — PROCHLORPERAZINE MALEATE 10 MG PO TABS
ORAL_TABLET | ORAL | Status: AC
  Filled 2019-09-03: qty 1

## 2019-09-03 MED ORDER — BORTEZOMIB CHEMO SQ INJECTION 3.5 MG (2.5MG/ML)
1.3000 mg/m2 | Freq: Once | INTRAMUSCULAR | Status: AC
  Administered 2019-09-03: 2 mg via SUBCUTANEOUS
  Filled 2019-09-03: qty 0.8

## 2019-09-03 NOTE — Patient Instructions (Signed)
[facility] Discharge Instructions for Patients Receiving Chemotherapy  Today you received the following chemotherapy agent: Velcade  To help prevent nausea and vomiting after your treatment, we encourage you to take your nausea medication as directed by your MD.   If you develop nausea and vomiting that is not controlled by your nausea medication, call the clinic.   BELOW ARE SYMPTOMS THAT SHOULD BE REPORTED IMMEDIATELY:  *FEVER GREATER THAN 100.5 F  *CHILLS WITH OR WITHOUT FEVER  NAUSEA AND VOMITING THAT IS NOT CONTROLLED WITH YOUR NAUSEA MEDICATION  *UNUSUAL SHORTNESS OF BREATH  *UNUSUAL BRUISING OR BLEEDING  TENDERNESS IN MOUTH AND THROAT WITH OR WITHOUT PRESENCE OF ULCERS  *URINARY PROBLEMS  *BOWEL PROBLEMS  UNUSUAL RASH Items with * indicate a potential emergency and should be followed up as soon as possible.  Feel free to call the clinic should you have any questions or concerns. The clinic phone number is [phone].  Please show the CHEMO ALERT CARD at check-in to the Emergency Department and triage nurse.   

## 2019-09-03 NOTE — Progress Notes (Signed)
1515 Pt reports L sided pain when coughing and moving, warm compress applied pt reports increased comfort w/ warm compress.

## 2019-09-03 NOTE — Progress Notes (Signed)
Brief nutrition follow-up patient during infusion for multiple myeloma. Patient reports his son has been giving him lots of snacks. Weight has improved and was documented as 118 pounds on August 9 up from 112 pounds. Patient has a bottle of Ensure Enlive which he consumed during infusion. He reports he tolerates oral nutrition supplements well.  States he gets strangled on water. Reports he is eating soft foods like mashed potatoes.  Nutrition diagnosis: Unintentional weight loss improved.  Intervention: Encourage patient to continue strategies for small frequent meals and snacks with high-calorie, high-protein foods. Recommended 2-3 bottles of Ensure Plus or Ensure Enlive daily.  Also encourage patient to continue soft moist foods to support weight gain.  Monitoring, evaluation, goals: Patient will tolerate increased calories and protein to minimize weight loss during treatment.  Next visit: To be scheduled as needed.  Patient has contact information for questions or concerns.  **Disclaimer: This note was dictated with voice recognition software. Similar sounding words can inadvertently be transcribed and this note may contain transcription errors which may not have been corrected upon publication of note.**

## 2019-09-04 DIAGNOSIS — N183 Chronic kidney disease, stage 3 unspecified: Secondary | ICD-10-CM | POA: Diagnosis not present

## 2019-09-04 DIAGNOSIS — C9000 Multiple myeloma not having achieved remission: Secondary | ICD-10-CM | POA: Diagnosis not present

## 2019-09-04 DIAGNOSIS — I677 Cerebral arteritis, not elsewhere classified: Secondary | ICD-10-CM | POA: Diagnosis not present

## 2019-09-04 DIAGNOSIS — I130 Hypertensive heart and chronic kidney disease with heart failure and stage 1 through stage 4 chronic kidney disease, or unspecified chronic kidney disease: Secondary | ICD-10-CM | POA: Diagnosis not present

## 2019-09-04 DIAGNOSIS — I5032 Chronic diastolic (congestive) heart failure: Secondary | ICD-10-CM | POA: Diagnosis not present

## 2019-09-04 DIAGNOSIS — I7300 Raynaud's syndrome without gangrene: Secondary | ICD-10-CM | POA: Diagnosis not present

## 2019-09-06 DIAGNOSIS — C9000 Multiple myeloma not having achieved remission: Secondary | ICD-10-CM | POA: Diagnosis not present

## 2019-09-06 DIAGNOSIS — N183 Chronic kidney disease, stage 3 unspecified: Secondary | ICD-10-CM | POA: Diagnosis not present

## 2019-09-06 DIAGNOSIS — I130 Hypertensive heart and chronic kidney disease with heart failure and stage 1 through stage 4 chronic kidney disease, or unspecified chronic kidney disease: Secondary | ICD-10-CM | POA: Diagnosis not present

## 2019-09-06 DIAGNOSIS — I677 Cerebral arteritis, not elsewhere classified: Secondary | ICD-10-CM | POA: Diagnosis not present

## 2019-09-06 DIAGNOSIS — I7300 Raynaud's syndrome without gangrene: Secondary | ICD-10-CM | POA: Diagnosis not present

## 2019-09-06 DIAGNOSIS — I5032 Chronic diastolic (congestive) heart failure: Secondary | ICD-10-CM | POA: Diagnosis not present

## 2019-09-08 DIAGNOSIS — I499 Cardiac arrhythmia, unspecified: Secondary | ICD-10-CM | POA: Diagnosis not present

## 2019-09-08 DIAGNOSIS — R0789 Other chest pain: Secondary | ICD-10-CM | POA: Diagnosis not present

## 2019-09-08 DIAGNOSIS — R079 Chest pain, unspecified: Secondary | ICD-10-CM | POA: Diagnosis not present

## 2019-09-09 ENCOUNTER — Other Ambulatory Visit: Payer: Self-pay | Admitting: Hematology and Oncology

## 2019-09-09 DIAGNOSIS — C9000 Multiple myeloma not having achieved remission: Secondary | ICD-10-CM

## 2019-09-10 ENCOUNTER — Other Ambulatory Visit: Payer: Self-pay

## 2019-09-10 ENCOUNTER — Other Ambulatory Visit: Payer: Medicare Other

## 2019-09-10 ENCOUNTER — Inpatient Hospital Stay (HOSPITAL_BASED_OUTPATIENT_CLINIC_OR_DEPARTMENT_OTHER): Payer: Medicare Other | Admitting: Hematology and Oncology

## 2019-09-10 ENCOUNTER — Inpatient Hospital Stay: Payer: Medicare Other

## 2019-09-10 VITALS — BP 131/97 | HR 90 | Temp 98.3°F | Resp 18 | Ht 66.0 in | Wt 121.1 lb

## 2019-09-10 DIAGNOSIS — I5032 Chronic diastolic (congestive) heart failure: Secondary | ICD-10-CM | POA: Diagnosis not present

## 2019-09-10 DIAGNOSIS — R1310 Dysphagia, unspecified: Secondary | ICD-10-CM

## 2019-09-10 DIAGNOSIS — M25511 Pain in right shoulder: Secondary | ICD-10-CM

## 2019-09-10 DIAGNOSIS — Z5112 Encounter for antineoplastic immunotherapy: Secondary | ICD-10-CM | POA: Diagnosis not present

## 2019-09-10 DIAGNOSIS — Z7982 Long term (current) use of aspirin: Secondary | ICD-10-CM | POA: Diagnosis not present

## 2019-09-10 DIAGNOSIS — C9000 Multiple myeloma not having achieved remission: Secondary | ICD-10-CM

## 2019-09-10 DIAGNOSIS — N183 Chronic kidney disease, stage 3 unspecified: Secondary | ICD-10-CM | POA: Diagnosis not present

## 2019-09-10 DIAGNOSIS — M25512 Pain in left shoulder: Secondary | ICD-10-CM

## 2019-09-10 DIAGNOSIS — I677 Cerebral arteritis, not elsewhere classified: Secondary | ICD-10-CM | POA: Diagnosis not present

## 2019-09-10 DIAGNOSIS — I7300 Raynaud's syndrome without gangrene: Secondary | ICD-10-CM | POA: Diagnosis not present

## 2019-09-10 DIAGNOSIS — I130 Hypertensive heart and chronic kidney disease with heart failure and stage 1 through stage 4 chronic kidney disease, or unspecified chronic kidney disease: Secondary | ICD-10-CM | POA: Diagnosis not present

## 2019-09-10 DIAGNOSIS — M899 Disorder of bone, unspecified: Secondary | ICD-10-CM

## 2019-09-10 LAB — CBC WITH DIFFERENTIAL (CANCER CENTER ONLY)
Abs Immature Granulocytes: 0.14 10*3/uL — ABNORMAL HIGH (ref 0.00–0.07)
Basophils Absolute: 0 10*3/uL (ref 0.0–0.1)
Basophils Relative: 0 %
Eosinophils Absolute: 0.1 10*3/uL (ref 0.0–0.5)
Eosinophils Relative: 1 %
HCT: 36.1 % — ABNORMAL LOW (ref 39.0–52.0)
Hemoglobin: 11.5 g/dL — ABNORMAL LOW (ref 13.0–17.0)
Immature Granulocytes: 1 %
Lymphocytes Relative: 8 %
Lymphs Abs: 1.2 10*3/uL (ref 0.7–4.0)
MCH: 29.3 pg (ref 26.0–34.0)
MCHC: 31.9 g/dL (ref 30.0–36.0)
MCV: 91.9 fL (ref 80.0–100.0)
Monocytes Absolute: 0.8 10*3/uL (ref 0.1–1.0)
Monocytes Relative: 6 %
Neutro Abs: 11.6 10*3/uL — ABNORMAL HIGH (ref 1.7–7.7)
Neutrophils Relative %: 84 %
Platelet Count: 300 10*3/uL (ref 150–400)
RBC: 3.93 MIL/uL — ABNORMAL LOW (ref 4.22–5.81)
RDW: 15.9 % — ABNORMAL HIGH (ref 11.5–15.5)
WBC Count: 13.8 10*3/uL — ABNORMAL HIGH (ref 4.0–10.5)
nRBC: 0 % (ref 0.0–0.2)

## 2019-09-10 LAB — CMP (CANCER CENTER ONLY)
ALT: 16 U/L (ref 0–44)
AST: 15 U/L (ref 15–41)
Albumin: 3.1 g/dL — ABNORMAL LOW (ref 3.5–5.0)
Alkaline Phosphatase: 187 U/L — ABNORMAL HIGH (ref 38–126)
Anion gap: 9 (ref 5–15)
BUN: 36 mg/dL — ABNORMAL HIGH (ref 8–23)
CO2: 23 mmol/L (ref 22–32)
Calcium: 10.1 mg/dL (ref 8.9–10.3)
Chloride: 103 mmol/L (ref 98–111)
Creatinine: 0.98 mg/dL (ref 0.61–1.24)
GFR, Est AFR Am: >60 mL/min (ref >60)
GFR, Estimated: >60 mL/min (ref >60)
Glucose, Bld: 119 mg/dL — ABNORMAL HIGH (ref 70–99)
Potassium: 4.9 mmol/L (ref 3.5–5.1)
Sodium: 135 mmol/L (ref 135–145)
Total Bilirubin: 0.3 mg/dL (ref 0.3–1.2)
Total Protein: 8 g/dL (ref 6.5–8.1)

## 2019-09-10 LAB — LACTATE DEHYDROGENASE: LDH: 194 U/L — ABNORMAL HIGH (ref 98–192)

## 2019-09-10 MED ORDER — BORTEZOMIB CHEMO SQ INJECTION 3.5 MG (2.5MG/ML)
1.3000 mg/m2 | Freq: Once | INTRAMUSCULAR | Status: AC
  Administered 2019-09-10: 2 mg via SUBCUTANEOUS
  Filled 2019-09-10: qty 0.8

## 2019-09-10 MED ORDER — PROCHLORPERAZINE MALEATE 10 MG PO TABS
10.0000 mg | ORAL_TABLET | Freq: Once | ORAL | Status: AC
  Administered 2019-09-10: 10 mg via ORAL

## 2019-09-10 MED ORDER — DEXAMETHASONE 4 MG PO TABS
40.0000 mg | ORAL_TABLET | Freq: Once | ORAL | Status: AC
  Administered 2019-09-10: 40 mg via ORAL

## 2019-09-10 MED ORDER — PROCHLORPERAZINE MALEATE 10 MG PO TABS
ORAL_TABLET | ORAL | Status: AC
  Filled 2019-09-10: qty 1

## 2019-09-10 NOTE — Patient Instructions (Signed)
[facility] Discharge Instructions for Patients Receiving Chemotherapy  Today you received the following chemotherapy agent: Velcade  To help prevent nausea and vomiting after your treatment, we encourage you to take your nausea medication as directed by your MD.   If you develop nausea and vomiting that is not controlled by your nausea medication, call the clinic.   BELOW ARE SYMPTOMS THAT SHOULD BE REPORTED IMMEDIATELY:  *FEVER GREATER THAN 100.5 F  *CHILLS WITH OR WITHOUT FEVER  NAUSEA AND VOMITING THAT IS NOT CONTROLLED WITH YOUR NAUSEA MEDICATION  *UNUSUAL SHORTNESS OF BREATH  *UNUSUAL BRUISING OR BLEEDING  TENDERNESS IN MOUTH AND THROAT WITH OR WITHOUT PRESENCE OF ULCERS  *URINARY PROBLEMS  *BOWEL PROBLEMS  UNUSUAL RASH Items with * indicate a potential emergency and should be followed up as soon as possible.  Feel free to call the clinic should you have any questions or concerns. The clinic phone number is [phone].  Please show the CHEMO ALERT CARD at check-in to the Emergency Department and triage nurse.   

## 2019-09-10 NOTE — Progress Notes (Signed)
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 Moderately severe compression fracture of L4 with bone month edema consistent with a more recent fracture. 3) 05/31/2019: patient underwent IR vertebral body augmentation for painful compression fracture at L4 using vertebroplasty technique 4) 06/20/2019: CT C/A/P demonstrated diffuse, heterogeneously lytic, moth-eaten appearance of the osseous structures, most consistent with multiple myeloma. SPEP showed M protein 1.5, Kapp 29.9, Lambda 16.1, Ratio 1.86.  5) 06/25/2019: establish care with Dr. Lorenso Courier  6) 07/03/2019: Bone marrow biopsy confirmed plasma cell myeloma with 30% lambda restricted plasma cells.  7) 07/09/2019: presented for discussion of diagnosis/treatment. Taken to ED for severe 10/10 worsening back pain.  8) 07/16/2019: admitted to [facility] hospital for a CVA, MRI showing several punctate acute infarctions including 1 in the right cerebellum and 2 to 4 in the left occipital and parietooccipital junction region 9) 07/30/2019: Cycle 1 Day 1 of Velcade/Dexamethasone 10) 08/06/2019: Cycle 1 Day 8 of Velcade/Dexamethasone 11) 08/13/2019: Cycle 1 Day 15 of Velcade/Dexamethasone 12) 08/20/2019: Cycle 2 Day 1 of Velcade/Dexamethasone  Interval History:  Kevin Perez 74 y.o. male with medical history significant for IgG lambda MM who presents for a follow up visit. The patient's last visit was on 08/20/2019 at which time he started Cycle 2 of Velcade Dex. In the interim he has no hospitalizations or ED visits. Today is Cycle 3 Day  1.   On exam today Kevin Perez notes appetite is getting better. It tends to be the best the 2 days after he receives his dex with chemo.  He notes that he has been walking without difficulty.  His pain is currently a 2 out of 10 in severity and he notes that it is normally pain on his side.  He reports that he takes about a half Tylenol and it "knocks it out".  He notes that he is otherwise been feeling quite good and that his swallowing is no longer as big of an issue as it was previously.  After discussion with his son he is agreeable to starting Revlimid therapy in order to help treat his multiple myeloma optimally.  Kevin Perez denies having any fevers, chills, sweats, nausea, vomiting or diarrhea. He notes no residual deficits from his stroke. A full 10 point ROS is listed below.  MEDICAL HISTORY:  Past Medical History:  Diagnosis Date  . Cancer ([place])   . GERD 07/21/2006  . Gout   . HYPERLIPIDEMIA 06/17/2009  . HYPERTENSION 07/21/2006  . Raynaud's syndrome 07/21/2006  . Suicide attempt [facility])    Due to Prednisone!  Marland Kitchen VASCULITIS 05/23/2007    SURGICAL HISTORY: Past Surgical History:  Procedure Laterality Date  . HERNIA REPAIR  3/0/07   umbilical hernia  . INGUINAL HERNIA REPAIR     Bil  . IR VERTEBROPLASTY LUMBAR BX INC UNI/BIL INC/INJECT/IMAGING  05/31/2019    SOCIAL HISTORY: Social History   Socioeconomic History  . Marital status: Married    Spouse name: Not on file  . Number of children: 2  . Years of education: Not on file  . Highest education level: Not on file  Occupational History  .  Occupation: Retired   Tobacco Use  . Smoking status: Former Smoker    Packs/day: 0.05    Years: 20.00    Pack years: 1.00    Types: Cigarettes  . Smokeless tobacco: Former Systems developer    Types: [place] date: 06/26/1989  . Tobacco comment: chewed tobacco until 89 more than he smoked   Vaping Use  . Vaping Use: Never used  Substance and Sexual Activity  . Alcohol use: Yes     Alcohol/week: 2.0 standard drinks    Types: 2 Standard drinks or equivalent per week    Comment: with meals on occasion  . Drug use: No  . Sexual activity: Not Currently  Other Topics Concern  . Not on file  Social History Narrative   Widowed 2019- was Married 1967. 2 kids. 1 grandchild. 1 greatgrandchilden.       Retired 2003 from [employer]      Hobbies: fishing, shagging, exercise, cruise, 91 GTO convertible   Social Determinants of Health   Financial Resource Strain:   . Difficulty of Paying Living Expenses: Not on file  Food Insecurity:   . Worried About Charity fundraiser in the Last Year: Not on file  . Ran Out of Food in the Last Year: Not on file  Transportation Needs:   . Lack of Transportation (Medical): Not on file  . Lack of Transportation (Non-Medical): Not on file  Physical Activity:   . Days of Exercise per Week: Not on file  . Minutes of Exercise per Session: Not on file  Stress:   . Feeling of Stress : Not on file  Social Connections:   . Frequency of Communication with Friends and Family: Not on file  . Frequency of Social Gatherings with Friends and Family: Not on file  . Attends Religious Services: Not on file  . Active Member of Clubs or Organizations: Not on file  . Attends Archivist Meetings: Not on file  . Marital Status: Not on file  Intimate Partner Violence:   . Fear of Current or Ex-Partner: Not on file  . Emotionally Abused: Not on file  . Physically Abused: Not on file  . Sexually Abused: Not on file    FAMILY HISTORY: Family History  Problem Relation Age of Onset  . Other Mother        passed age 69  . Hypertension Father   . Prostate cancer Father 8       never operated, not cause of death  . Stroke Father   . Meniere's disease Father   . Early death Paternal Grandfather   . Bladder Cancer Son     ALLERGIES:  is allergic to prednisone, amoxicillin-pot clavulanate, ace inhibitors, and codeine  phosphate.  MEDICATIONS:  Current Outpatient Medications  Medication Sig Dispense Refill  . acetaminophen (TYLENOL) 500 MG tablet Take 1,000 mg by mouth as needed for moderate pain.     Marland Kitchen acyclovir (ZOVIRAX) 400 MG tablet Take 1 tablet (400 mg total) by mouth daily. 90 tablet 1  . allopurinol (ZYLOPRIM) 100 MG tablet Take 1 tablet (100 mg total) by mouth daily. 90 tablet 3  . atorvastatin (LIPITOR) 40 MG tablet Take 1 tablet (40 mg total) by mouth 3 (three) times a week. 90 tablet 3  . clonazePAM (KLONOPIN) 1 MG tablet Take 1 mg by mouth 2 (two) times daily as needed for anxiety. 1/2 tab    . clopidogrel (PLAVIX) 75 MG tablet Take 1 tablet (  75 mg total) by mouth daily. 30 tablet 5  . mirtazapine (REMERON) 15 MG tablet TAKE 1 TABLET BY MOUTH EVERYDAY AT BEDTIME (Patient taking differently: Take 15 mg by mouth at bedtime. ) 90 tablet 1  . oxyCODONE (OXY IR/ROXICODONE) 5 MG immediate release tablet Take 1 tablet (5 mg total) by mouth every 6 (six) hours as needed for severe pain. 5 tablet 0  . pantoprazole (PROTONIX) 40 MG tablet TAKE 1 TABLET BY MOUTH EVERY DAY 90 tablet 3  . polyethylene glycol (MIRALAX / GLYCOLAX) 17 g packet Take 17 g by mouth daily.    . sennosides-docusate sodium (SENOKOT-S) 8.6-50 MG tablet Take 1 tablet by mouth daily as needed for constipation.    . tamsulosin (FLOMAX) 0.4 MG CAPS capsule TAKE 1 CAPSULE BY MOUTH EVERY DAY (Patient taking differently: Take 0.4 mg by mouth daily. ) 90 capsule 1   No current facility-administered medications for this visit.    REVIEW OF SYSTEMS:   Constitutional: ( - ) fevers, ( - )  chills , ( - ) night sweats Eyes: ( - ) blurriness of vision, ( - ) double vision, ( - ) watery eyes Ears, nose, mouth, throat, and face: ( - ) mucositis, ( - ) sore throat Respiratory: ( - ) cough, ( - ) dyspnea, ( - ) wheezes Cardiovascular: ( - ) palpitation, ( - ) chest discomfort, ( - ) lower extremity swelling Gastrointestinal:  ( - ) nausea, ( - )  heartburn, ( - ) change in bowel habits Skin: ( - ) abnormal skin rashes Lymphatics: ( - ) new lymphadenopathy, ( - ) easy bruising Neurological: ( - ) numbness, ( - ) tingling, ( - ) new weaknesses Behavioral/Psych: ( - ) mood change, ( - ) new changes  All other systems were reviewed with the patient and are negative.  PHYSICAL EXAMINATION: ECOG PERFORMANCE STATUS: 1 - Symptomatic but completely ambulatory  Vitals:   09/10/19 1136  BP: (!) 131/97  Pulse: 90  Resp: 18  Temp: 98.3 F (36.8 C)  SpO2: 96%   Filed Weights   09/10/19 1136  Weight: 121 lb 1.6 oz (54.9 kg)    GENERAL: well appearing elderly Caucasian male. Alert, no distress and comfortable SKIN: skin color, texture, turgor are normal, no rashes or significant lesions EYES: conjunctiva are pink and non-injected, sclera clear LUNGS: some wheezes and course crackles on lung exam HEART: regular rate & rhythm and no murmurs and no lower extremity edema Musculoskeletal: no cyanosis of digits and no clubbing  PSYCH: alert & oriented x 3, fluent speech NEURO: no focal motor/sensory deficits  LABORATORY DATA:  I have reviewed the data as listed CBC Latest Ref Rng & Units 09/10/2019 09/03/2019 08/27/2019  WBC 4.0 - 10.5 K/uL 13.8(H) 11.8(H) 12.9(H)  Hemoglobin 13.0 - 17.0 g/dL 11.5(L) 11.3(L) 10.9(L)  Hematocrit 39 - 52 % 36.1(L) 35.9(L) 33.8(L)  Platelets 150 - 400 K/uL 300 263 202    CMP Latest Ref Rng & Units 09/10/2019 09/03/2019 08/27/2019  Glucose 70 - 99 mg/dL 119(H) 109(H) 118(H)  BUN 8 - 23 mg/dL 36(H) 41(H) 38(H)  Creatinine 0.61 - 1.24 mg/dL 0.98 0.93 0.96  Sodium 135 - 145 mmol/L 135 137 134(L)  Potassium 3.5 - 5.1 mmol/L 4.9 4.8 4.9  Chloride 98 - 111 mmol/L 103 107 103  CO2 22 - 32 mmol/L 23 22 21(L)  Calcium 8.9 - 10.3 mg/dL 10.1 9.7 9.2  Total Protein 6.5 - 8.1 g/dL 8.0 8.0 7.9  Total Bilirubin 0.3 - 1.2 mg/dL 0.3 0.3 0.3  Alkaline Phos 38 - 126 U/L 187(H) 196(H) 183(H)  AST 15 - 41 U/L _0 ALT 0  - 44 U/L 16 35 24    No results found for: MPROTEIN Lab Results  Component Value Date   KAPLAMBRATIO 9.97 06/27/2019     RADIOGRAPHIC STUDIES: No results found.  ASSESSMENT & PLAN Lemar Bakos Vandevander 74 y.o. male with medical history significant for newly diagnosed IgG lambda MM who presents for a follow up visit.  He is here today for Cycle 2 Day 1 of Velcade/Dex treatment.  Today Mr. [place] notes that his appetite is improving and his pain is under good control.  He is also having less difficulty with the swallowing.  He is agreeable to proceeding with Revlimid therapy at this time.  We will have the son order the medication and inform us when he has started therapy.   Treatment plan consists of bortezomib 1.109m/m2 on Day 1, 8, and 15, dexamethasone 469mPO Day 1, 8, and 15, and Lenalidomide 2529mO day 1-14. Cycles are to be repeated every 21 days and can be continued x 8 cycles. Given his current kidney function will start with Vd and then plan to add revlimid when patient is ready. Once on VRd this can be reduced to maintenance Vd or Rd if/after patient has good response. He will also be receiving monthly zometa therapy for lytic bone lesions.   *patient has history of suicide attempt on prednisone. This was discussed with patient and his son who were unsure what role this medication played in his suicide attempt. This regimen contains dexamethasone, which is essential for his treatment. We will attempt full dose at 70m31mut if psychiatric issues arise we can decrease this.   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 Labs adequate to proceed with treatment.  -- Given his kidney function administered Cycle 1 of treatment without revlimid. Kidney function has improved and they are now agreeable to proceeding with revlimid.  --have son call once revlimid has been received and has been started.  --details of VRd regimen above (Blood  (2010) 116 (23): [id number]--additionally continue monthly zometa (first dose on 07/26/2019)  (2 years of duration) --RTC every 2 weeks for continued monitoring   #Lytic Bone Lesions #Compression fracture #Hypercalcemia, resolved --patient has received dental clearance for Zometa therapy --zometa administered in clinic on 07/26/2019 at 4g due to hypercalcemia of malignancy. Dose administered today (repeat q 4 weeks, next due Sept 2021)  --continue to monitor   #Supportive Care --supportive therapy with acyclovir 400mg37m and ASA 81mg 51maily.  --nausea medication with zofran 8mg PO12mily  --zometa as above  #Back Pain, improving #Shoulder Pain, improved --due to persistent shoulder pain will order bilateral plain films of the shoulders and T spine imaging.  --back pain is currently under control --[facility] recommended TLSO brace for comfort. No procedural intervention recommended at this time.  -- 10 mg oxycodone every 6 hours with alternating 1 g Tylenol every 8 hours for pain control.  He has only required tylenol since our last visit.   #Dysphagia, improving.  --evaluated by GI, thought to be esophageal dysmotility based on radiographic esophageal studies --recommend peppermint to help with spasms.  --appreciate the assistance of GI.  --continue to monitor   No orders of the defined types were placed in this encounter.  All questions were answered. The patient knows to call the clinic with any problems, questions or concerns.  A total of more than 30 minutes were spent on this encounter and over half of that time was spent on counseling and coordination of care as outlined above.   Ledell Peoples, MD Department of Hematology/Oncology [place] at [facility] Phone: [phone] Pager: [phone] Email: [email or web address]  09/10/2019 1:00 PM   [place] S, Ed Blalock, Cavalli M, [place], [place], [place], [place] F, [place],  [place], Guglielmelli T, [place], [place] V, Rizzo V, Cang[place] C, [place] P, [place], [place] AM, [place], [place] AP, [place] Kos, Cavo M, Gaidano G, Boccadoro M, Pal[place] A. Efficacy and safety of once-weekly bortezomib in multiple myeloma patients. Blood. 2010 Dec 2;116(23):4745-53.  Erratum in: Blood. 2012 Dec 20;120(26):5250.  --Within the limitations of this nonrandomized post-hoc analysis, once-weekly bortezomib seems to be equally effective and better tolerated than the standard twice-weekly schedule. The outcomes and response rate did not appear to be affected by the bortezomib dosing schedule. Durability of response was similar between groups: median PFS was 33.1 months in the once-weekly group and 31.7 months in the twice-weekly group. Overall response rates were 85% and 86%, including CR rates of 30% and 35%, in the once-weekly and twice-weekly groups, respectively.

## 2019-09-11 DIAGNOSIS — I677 Cerebral arteritis, not elsewhere classified: Secondary | ICD-10-CM | POA: Diagnosis not present

## 2019-09-11 DIAGNOSIS — I5032 Chronic diastolic (congestive) heart failure: Secondary | ICD-10-CM | POA: Diagnosis not present

## 2019-09-11 DIAGNOSIS — I7300 Raynaud's syndrome without gangrene: Secondary | ICD-10-CM | POA: Diagnosis not present

## 2019-09-11 DIAGNOSIS — N183 Chronic kidney disease, stage 3 unspecified: Secondary | ICD-10-CM | POA: Diagnosis not present

## 2019-09-11 DIAGNOSIS — I130 Hypertensive heart and chronic kidney disease with heart failure and stage 1 through stage 4 chronic kidney disease, or unspecified chronic kidney disease: Secondary | ICD-10-CM | POA: Diagnosis not present

## 2019-09-11 DIAGNOSIS — C9000 Multiple myeloma not having achieved remission: Secondary | ICD-10-CM | POA: Diagnosis not present

## 2019-09-11 LAB — BETA 2 MICROGLOBULIN, SERUM: Beta-2 Microglobulin: 4.2 mg/L — ABNORMAL HIGH (ref 0.6–2.4)

## 2019-09-11 LAB — KAPPA/LAMBDA LIGHT CHAINS
Kappa free light chain: 27 mg/L — ABNORMAL HIGH (ref 3.3–19.4)
Kappa, lambda light chain ratio: 1.09 (ref 0.26–1.65)
Lambda free light chains: 24.7 mg/L (ref 5.7–26.3)

## 2019-09-12 ENCOUNTER — Telehealth: Payer: Self-pay | Admitting: *Deleted

## 2019-09-12 ENCOUNTER — Other Ambulatory Visit: Payer: Self-pay | Admitting: *Deleted

## 2019-09-12 LAB — MULTIPLE MYELOMA PANEL, SERUM
Albumin SerPl Elph-Mcnc: 3.1 g/dL (ref 2.9–4.4)
Albumin/Glob SerPl: 0.8 (ref 0.7–1.7)
Alpha 1: 0.3 g/dL (ref 0.0–0.4)
Alpha2 Glob SerPl Elph-Mcnc: 1.2 g/dL — ABNORMAL HIGH (ref 0.4–1.0)
B-Globulin SerPl Elph-Mcnc: 1 g/dL (ref 0.7–1.3)
Gamma Glob SerPl Elph-Mcnc: 1.7 g/dL (ref 0.4–1.8)
Globulin, Total: 4.2 g/dL — ABNORMAL HIGH (ref 2.2–3.9)
IgA: 139 mg/dL (ref 61–437)
IgG (Immunoglobin G), Serum: 1915 mg/dL — ABNORMAL HIGH (ref 603–1613)
IgM (Immunoglobulin M), Srm: 91 mg/dL (ref 15–143)
M Protein SerPl Elph-Mcnc: 1.1 g/dL — ABNORMAL HIGH
Total Protein ELP: 7.3 g/dL (ref 6.0–8.5)

## 2019-09-12 MED ORDER — LENALIDOMIDE 10 MG PO CAPS: 10.0000 mg | ORAL_CAPSULE | Freq: Every day | ORAL | 0 refills | Status: AC

## 2019-09-12 NOTE — Telephone Encounter (Signed)
Revlimid prescription escribed to [facility] specialty pharmacy on 09/12/19

## 2019-09-13 ENCOUNTER — Inpatient Hospital Stay: Payer: Self-pay | Admitting: Neurology

## 2019-09-13 DIAGNOSIS — N183 Chronic kidney disease, stage 3 unspecified: Secondary | ICD-10-CM | POA: Diagnosis not present

## 2019-09-13 DIAGNOSIS — C9000 Multiple myeloma not having achieved remission: Secondary | ICD-10-CM | POA: Diagnosis not present

## 2019-09-13 DIAGNOSIS — I5032 Chronic diastolic (congestive) heart failure: Secondary | ICD-10-CM | POA: Diagnosis not present

## 2019-09-13 DIAGNOSIS — I7300 Raynaud's syndrome without gangrene: Secondary | ICD-10-CM | POA: Diagnosis not present

## 2019-09-13 DIAGNOSIS — I677 Cerebral arteritis, not elsewhere classified: Secondary | ICD-10-CM | POA: Diagnosis not present

## 2019-09-13 DIAGNOSIS — I130 Hypertensive heart and chronic kidney disease with heart failure and stage 1 through stage 4 chronic kidney disease, or unspecified chronic kidney disease: Secondary | ICD-10-CM | POA: Diagnosis not present

## 2019-09-15 ENCOUNTER — Encounter (HOSPITAL_COMMUNITY): Payer: Self-pay

## 2019-09-15 ENCOUNTER — Observation Stay (HOSPITAL_COMMUNITY)
Admission: EM | Admit: 2019-09-15 | Discharge: 2019-09-17 | Disposition: A | Discharge status: Home / Self Care | Payer: Medicare Other | Attending: Internal Medicine | Admitting: Internal Medicine

## 2019-09-15 ENCOUNTER — Emergency Department (HOSPITAL_COMMUNITY): Payer: Medicare Other

## 2019-09-15 ENCOUNTER — Other Ambulatory Visit: Payer: Self-pay

## 2019-09-15 DIAGNOSIS — N401 Enlarged prostate with lower urinary tract symptoms: Secondary | ICD-10-CM | POA: Diagnosis present

## 2019-09-15 DIAGNOSIS — N183 Chronic kidney disease, stage 3 unspecified: Secondary | ICD-10-CM | POA: Diagnosis not present

## 2019-09-15 DIAGNOSIS — R21 Rash and other nonspecific skin eruption: Secondary | ICD-10-CM | POA: Diagnosis not present

## 2019-09-15 DIAGNOSIS — I2510 Atherosclerotic heart disease of native coronary artery without angina pectoris: Secondary | ICD-10-CM | POA: Insufficient documentation

## 2019-09-15 DIAGNOSIS — I491 Atrial premature depolarization: Secondary | ICD-10-CM | POA: Diagnosis not present

## 2019-09-15 DIAGNOSIS — I130 Hypertensive heart and chronic kidney disease with heart failure and stage 1 through stage 4 chronic kidney disease, or unspecified chronic kidney disease: Secondary | ICD-10-CM | POA: Insufficient documentation

## 2019-09-15 DIAGNOSIS — M109 Gout, unspecified: Secondary | ICD-10-CM | POA: Diagnosis present

## 2019-09-15 DIAGNOSIS — E860 Dehydration: Secondary | ICD-10-CM | POA: Diagnosis not present

## 2019-09-15 DIAGNOSIS — I509 Heart failure, unspecified: Secondary | ICD-10-CM | POA: Diagnosis not present

## 2019-09-15 DIAGNOSIS — I5032 Chronic diastolic (congestive) heart failure: Secondary | ICD-10-CM | POA: Diagnosis present

## 2019-09-15 DIAGNOSIS — J439 Emphysema, unspecified: Secondary | ICD-10-CM | POA: Diagnosis not present

## 2019-09-15 DIAGNOSIS — R55 Syncope and collapse: Principal | ICD-10-CM

## 2019-09-15 DIAGNOSIS — Z79899 Other long term (current) drug therapy: Secondary | ICD-10-CM | POA: Insufficient documentation

## 2019-09-15 DIAGNOSIS — C9000 Multiple myeloma not having achieved remission: Secondary | ICD-10-CM

## 2019-09-15 DIAGNOSIS — R531 Weakness: Secondary | ICD-10-CM | POA: Diagnosis present

## 2019-09-15 DIAGNOSIS — S2242XA Multiple fractures of ribs, left side, initial encounter for closed fracture: Secondary | ICD-10-CM | POA: Insufficient documentation

## 2019-09-15 DIAGNOSIS — Z20822 Contact with and (suspected) exposure to covid-19: Secondary | ICD-10-CM | POA: Insufficient documentation

## 2019-09-15 DIAGNOSIS — R4020 Unspecified coma: Secondary | ICD-10-CM | POA: Diagnosis not present

## 2019-09-15 DIAGNOSIS — Z8673 Personal history of transient ischemic attack (TIA), and cerebral infarction without residual deficits: Secondary | ICD-10-CM

## 2019-09-15 DIAGNOSIS — Z87891 Personal history of nicotine dependence: Secondary | ICD-10-CM | POA: Diagnosis not present

## 2019-09-15 DIAGNOSIS — I499 Cardiac arrhythmia, unspecified: Secondary | ICD-10-CM | POA: Diagnosis not present

## 2019-09-15 LAB — CBC WITH DIFFERENTIAL/PLATELET
Abs Immature Granulocytes: 0.19 10*3/uL — ABNORMAL HIGH (ref 0.00–0.07)
Basophils Absolute: 0 10*3/uL (ref 0.0–0.1)
Basophils Relative: 0 %
Eosinophils Absolute: 0.1 10*3/uL (ref 0.0–0.5)
Eosinophils Relative: 1 %
HCT: 36.7 % — ABNORMAL LOW (ref 39.0–52.0)
Hemoglobin: 11.2 g/dL — ABNORMAL LOW (ref 13.0–17.0)
Immature Granulocytes: 2 %
Lymphocytes Relative: 10 %
Lymphs Abs: 1.3 10*3/uL (ref 0.7–4.0)
MCH: 29.2 pg (ref 26.0–34.0)
MCHC: 30.5 g/dL (ref 30.0–36.0)
MCV: 95.8 fL (ref 80.0–100.0)
Monocytes Absolute: 0.9 10*3/uL (ref 0.1–1.0)
Monocytes Relative: 8 %
Neutro Abs: 9.8 10*3/uL — ABNORMAL HIGH (ref 1.7–7.7)
Neutrophils Relative %: 79 %
Platelets: 241 10*3/uL (ref 150–400)
RBC: 3.83 MIL/uL — ABNORMAL LOW (ref 4.22–5.81)
RDW: 16.4 % — ABNORMAL HIGH (ref 11.5–15.5)
WBC: 12.3 10*3/uL — ABNORMAL HIGH (ref 4.0–10.5)
nRBC: 0 % (ref 0.0–0.2)

## 2019-09-15 LAB — URINALYSIS, ROUTINE W REFLEX MICROSCOPIC
Bilirubin Urine: NEGATIVE
Glucose, UA: NEGATIVE mg/dL
Hgb urine dipstick: NEGATIVE
Ketones, ur: NEGATIVE mg/dL
Leukocytes,Ua: NEGATIVE
Nitrite: NEGATIVE
Protein, ur: NEGATIVE mg/dL
Specific Gravity, Urine: 1.015 (ref 1.005–1.030)
pH: 6 (ref 5.0–8.0)

## 2019-09-15 LAB — TROPONIN I (HIGH SENSITIVITY)
Troponin I (High Sensitivity): 13 ng/L (ref <18)
Troponin I (High Sensitivity): 24 ng/L — ABNORMAL HIGH (ref <18)

## 2019-09-15 LAB — COMPREHENSIVE METABOLIC PANEL
ALT: 23 U/L (ref 0–44)
AST: 32 U/L (ref 15–41)
Albumin: 3 g/dL — ABNORMAL LOW (ref 3.5–5.0)
Alkaline Phosphatase: 145 U/L — ABNORMAL HIGH (ref 38–126)
Anion gap: 11 (ref 5–15)
BUN: 37 mg/dL — ABNORMAL HIGH (ref 8–23)
CO2: 21 mmol/L — ABNORMAL LOW (ref 22–32)
Calcium: 8.7 mg/dL — ABNORMAL LOW (ref 8.9–10.3)
Chloride: 104 mmol/L (ref 98–111)
Creatinine, Ser: 0.93 mg/dL (ref 0.61–1.24)
GFR calc Af Amer: >60 mL/min (ref >60)
GFR calc non Af Amer: >60 mL/min (ref >60)
Glucose, Bld: 103 mg/dL — ABNORMAL HIGH (ref 70–99)
Potassium: 5 mmol/L (ref 3.5–5.1)
Sodium: 136 mmol/L (ref 135–145)
Total Bilirubin: 0.5 mg/dL (ref 0.3–1.2)
Total Protein: 6.7 g/dL (ref 6.5–8.1)

## 2019-09-15 LAB — CBG MONITORING, ED: Glucose-Capillary: 106 mg/dL — ABNORMAL HIGH (ref 70–99)

## 2019-09-15 MED ORDER — SODIUM CHLORIDE 0.9 % IV SOLN: INTRAVENOUS | Status: DC

## 2019-09-15 MED ORDER — SODIUM CHLORIDE 0.9 % IV BOLUS
1000.0000 mL | Freq: Once | INTRAVENOUS | Status: AC
  Administered 2019-09-15: 1000 mL via INTRAVENOUS

## 2019-09-15 NOTE — ED Provider Notes (Signed)
[facility] EMERGENCY DEPARTMENT Provider Note   CSN: [id number] Arrival date & time: 09/15/19  1757     History Chief Complaint  Patient presents with  . Loss of Consciousness    Kevin Perez is a 74 y.o. male.  Pt presents to the ED today with syncope.  The pt said sx started after a longer walk than usual in the heat.  Pt said he finished his walk, felt weak and light headed.  He went to the bathroom to have a bm and passed out in the bathroom.  Syncope for about 2-3 minutes.  EMS said pt had a run of vtach on the way here.   Pt denies any sob or cp.  Pt does have a hx of multiple myeloma and gets a chemo shot in his abdomen every week.  Last dose on 8/23.        Past Medical History:  Diagnosis Date  . Cancer ([place])   . GERD 07/21/2006  . Gout   . HYPERLIPIDEMIA 06/17/2009  . HYPERTENSION 07/21/2006  . Raynaud's syndrome 07/21/2006  . Suicide attempt [facility])    Due to Prednisone!  Marland Kitchen VASCULITIS 05/23/2007    Patient Active Problem List   Diagnosis Date Noted  . Syncope and collapse 09/15/2019  . Esophageal dysmotility 08/17/2019  . Hypercalcemia 07/26/2019  . Chronic diastolic CHF (congestive heart failure) ([place]) 07/17/2019  . History of CVA (cerebrovascular accident) 07/16/2019  . Hyponatremia 07/16/2019  . Lumbar compression fracture ([place]) 07/16/2019  . CKD (chronic kidney disease), stage III 07/16/2019  . Multiple myeloma without remission ([place]) 07/03/2019  . Goals of care, counseling/discussion 06/25/2019  . Coronary atherosclerosis 06/20/2019  . Aortic atherosclerosis ([place]) 06/20/2019  . Protein-calorie malnutrition ([place]) 05/01/2019  . Former smoker 04/30/2014  . Testicular swelling, left 04/30/2014  . BPH associated with nocturia 04/30/2014  . Prepatellar bursitis of right knee 01/22/2014  . Gout 01/08/2014  . Insomnia 10/29/2013  . Senile cataracts of both eyes 08/22/2013  . 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 Macular degeneration 02/03/2011  . Hyperlipidemia 06/17/2009  . History of arteritis 05/23/2007  . History of suicide attempt- while on high dose steroids. avoid prednisone.  01/19/2007  . Essential hypertension 07/21/2006  . GERD 07/21/2006    Past Surgical History:  Procedure Laterality Date  . HERNIA REPAIR  08/24/55   umbilical hernia  . INGUINAL HERNIA REPAIR     Bil  . IR VERTEBROPLASTY LUMBAR BX INC UNI/BIL INC/INJECT/IMAGING  05/31/2019       Family History  Problem Relation Age of Onset  . Other Mother        passed age 82  . Hypertension Father   . Prostate cancer Father 33       never operated, not cause of death  . Stroke Father   . Meniere's disease Father   . Early death Paternal Grandfather   . Bladder Cancer Son     Social History   Tobacco Use  . Smoking status: Former Smoker    Packs/day: 0.05    Years: 20.00    Pack years: 1.00    Types: Cigarettes  . Smokeless tobacco: Former Systems developer    Types: [place] date: 06/26/1989  . Tobacco comment: chewed tobacco until 89 more than he smoked   Vaping Use  . Vaping Use: Never used  Substance Use Topics  . Alcohol use: Yes    Alcohol/week:  2.0 standard drinks    Types: 2 Standard drinks or equivalent per week    Comment: with meals on occasion  . Drug use: No    Home Medications Prior to Admission medications   Medication Sig Start Date End Date Taking? Authorizing Provider  acetaminophen (TYLENOL) 500 MG tablet Take 1,000 mg by mouth as needed for moderate pain.     [provider]  acyclovir (ZOVIRAX) 400 MG tablet Take 1 tablet (400 mg total) by mouth daily. 07/26/19   Orson Slick, MD  allopurinol (ZYLOPRIM) 100 MG tablet Take 1 tablet (100 mg total) by mouth daily. 02/26/19   [name], MD  atorvastatin (LIPITOR) 40 MG tablet Take 1 tablet (40 mg total) by mouth 3 (three) times a week. 02/28/19   [name], MD  clonazePAM (KLONOPIN) 1 MG  tablet Take 1 mg by mouth 2 (two) times daily as needed for anxiety. 1/2 tab    [provider]  clopidogrel (PLAVIX) 75 MG tablet Take 1 tablet (75 mg total) by mouth daily. 08/17/19 02/13/20  [name], MD  lenalidomide (REVLIMID) 10 MG capsule Take 1 capsule (10 mg total) by mouth daily. [employer] Auth # [id number]     Date Obtained 09/12/19 09/12/19   Orson Slick, MD  mirtazapine (REMERON) 15 MG tablet TAKE 1 TABLET BY MOUTH EVERYDAY AT BEDTIME Patient taking differently: Take 15 mg by mouth at bedtime.  06/28/19   Orma Flaming, MD  oxyCODONE (OXY IR/ROXICODONE) 5 MG immediate release tablet Take 1 tablet (5 mg total) by mouth every 6 (six) hours as needed for severe pain. 07/20/19   Donne Hazel, MD  pantoprazole (PROTONIX) 40 MG tablet TAKE 1 TABLET BY MOUTH EVERY DAY 08/29/19   [name], MD  polyethylene glycol (MIRALAX / GLYCOLAX) 17 g packet Take 17 g by mouth daily.    [provider]  sennosides-docusate sodium (SENOKOT-S) 8.6-50 MG tablet Take 1 tablet by mouth daily as needed for constipation.    [provider]  tamsulosin (FLOMAX) 0.4 MG CAPS capsule TAKE 1 CAPSULE BY MOUTH EVERY DAY Patient taking differently: Take 0.4 mg by mouth daily.  05/17/19   [name], MD    Allergies    Prednisone, Amoxicillin-pot clavulanate, Ace inhibitors, and Codeine phosphate  Review of Systems   Review of Systems  Neurological: Positive for syncope.  All other systems reviewed and are negative.   Physical Exam Updated Vital Signs BP 130/66   Pulse 78   Temp 97.8 F (36.6 C) (Oral)   Resp 18   SpO2 97%   Physical Exam Vitals and nursing note reviewed.  Constitutional:      Appearance: Normal appearance.  HENT:     Head: Normocephalic and atraumatic.     Right Ear: External ear normal.     Left Ear: External ear normal.     Nose: Nose normal.     Mouth/Throat:     Mouth: Mucous membranes are moist.     Pharynx: Oropharynx is clear.   Eyes:     Extraocular Movements: Extraocular movements intact.     Conjunctiva/sclera: Conjunctivae normal.     Pupils: Pupils are equal, round, and reactive to light.  Cardiovascular:     Rate and Rhythm: Normal rate and regular rhythm.     Pulses: Normal pulses.     Heart sounds: Normal heart sounds.  Pulmonary:     Effort: Pulmonary effort is normal.  Breath sounds: Normal breath sounds.  Abdominal:     General: Abdomen is flat. Bowel sounds are normal.     Palpations: Abdomen is soft.  Musculoskeletal:        General: Normal range of motion.     Cervical back: Normal range of motion and neck supple.  Skin:    General: Skin is warm.     Capillary Refill: Capillary refill takes less than 2 seconds.  Neurological:     General: No focal deficit present.     Mental Status: He is alert and oriented to person, place, and time.  Psychiatric:        Mood and Affect: Mood normal.        Behavior: Behavior normal.     ED Results / Procedures / Treatments   Labs (all labs ordered are listed, but only abnormal results are displayed) Labs Reviewed  CBC WITH DIFFERENTIAL/PLATELET - Abnormal; Notable for the following components:      Result Value   WBC 12.3 (*)    RBC 3.83 (*)    Hemoglobin 11.2 (*)    HCT 36.7 (*)    RDW 16.4 (*)    Neutro Abs 9.8 (*)    Abs Immature Granulocytes 0.19 (*)    All other components within normal limits  COMPREHENSIVE METABOLIC PANEL - Abnormal; Notable for the following components:   CO2 21 (*)    Glucose, Bld 103 (*)    BUN 37 (*)    Calcium 8.7 (*)    Albumin 3.0 (*)    Alkaline Phosphatase 145 (*)    All other components within normal limits  CBG MONITORING, ED - Abnormal; Notable for the following components:   Glucose-Capillary 106 (*)    All other components within normal limits  TROPONIN I (HIGH SENSITIVITY) - Abnormal; Notable for the following components:   Troponin I (High Sensitivity) 24 (*)    All other components within  normal limits  SARS CORONAVIRUS 2 BY RT PCR (HOSPITAL ORDER, [facility])  URINALYSIS, ROUTINE W REFLEX MICROSCOPIC  MAGNESIUM  TROPONIN I (HIGH SENSITIVITY)    EKG None  Radiology DG Chest Port 1 View  Result Date: 09/15/2019 CLINICAL DATA:  74 year old male with syncope. History of multiple myeloma. EXAM: PORTABLE CHEST 1 VIEW COMPARISON:  Chest radiograph dated 07/16/2019. FINDINGS: There is background of emphysema. Left mid to lower lung field linear and streaky atelectasis/scarring. No lobar consolidation, pleural effusion, pneumothorax. The cardiac silhouette is within limits. Atherosclerotic calcification of the aorta. Multiple mildly displaced left lateral rib fractures, likely acute. IMPRESSION: 1. Mildly displaced left lateral rib fractures.  No pneumothorax. 2. No acute cardiopulmonary process. Electronically Signed   By: Anner Crete M.D.   On: 09/15/2019 19:43    Procedures Procedures (including critical care time)  Medications Ordered in ED Medications  sodium chloride 0.9 % bolus 1,000 mL (0 mLs Intravenous Stopped 09/15/19 1930)    And  0.9 %  sodium chloride infusion ( Intravenous New Bag/Given 09/15/19 1937)    ED Course  I have reviewed the triage vital signs and the nursing notes.  Pertinent labs & imaging results that were available during my care of the patient were reviewed by me and considered in my medical decision making (see chart for details).    MDM Rules/Calculators/A&P                          Pt's syncope may  be due to overexertion and vasovagal while using the bathroom.  However, due to the possibility of vtach and slight bump in troponin, I think it is wise to observe overnight.  Pt d/w Dr. Claria Dice (triad) for admission.  Final Clinical Impression(s) / ED Diagnoses Final diagnoses:  Syncope, unspecified syncope type    Rx / DC Orders ED Discharge Orders    None       Isla Pence, MD 09/15/19 2319

## 2019-09-15 NOTE — ED Triage Notes (Signed)
Per EMS Pt was at home after "heavy exertion" today Pt started to feel weak and light headed. When he went to have a BM he went unconscious for 2-3  minutes. Family witnessed and called EMS. En route Pt had a run of Vtach. Resolved on arrival. 800 ml of NS. Pt is currently receiving chemo for cancer.

## 2019-09-15 NOTE — H&P (Signed)
PCP:   [name], MD   Chief Complaint:  Syncope   HPI: This is a 74 year old male with history of melanoma, on q. weekly chemotherapy for the past 4 weeks.  His last chemotherapy session was Monday.  Today he was out walking with his son.  He states he became overheated and wanted to stop.  His son encouraged him to go a few more steps. He then syncopized on the front yard.  It was a witnessed syncopal event.  He was out for 2 to 3 minutes.  He states that he was little lightheaded prior to syncopizing.  He denies any chest pains prior to the event.  He denies any postictal symptoms.  There is no report of any seizure type activity.  He states his appetite has been decreased for the past few weeks.  He has been having some issues with swallowing that has being worked up by his outpatient physician.  He is on pured or mechanical soft meals.  His son called 911 after he syncopized.  In route to the hospital he had a run of V. tach which self resolved.  His troponin is mildly bumped.  Hospitalist have been asked to admit for overnight evaluation.   Review of Systems:  The patient denies anorexia, fever, weight loss,, vision loss, decreased hearing, hoarseness, chest pain, syncope, dyspnea on exertion, peripheral edema, balance deficits, hemoptysis, abdominal pain, melena, hematochezia, severe indigestion/heartburn, hematuria, incontinence, genital sores, muscle weakness, suspicious skin lesions, transient blindness, difficulty walking, depression, unusual weight change, abnormal bleeding, enlarged lymph nodes, angioedema, and breast masses. Positives: Syncopal event, mild lightheadedness  Past Medical History: Past Medical History:  Diagnosis Date  . Cancer ([place])   . GERD 07/21/2006  . Gout   . HYPERLIPIDEMIA 06/17/2009  . HYPERTENSION 07/21/2006  . Raynaud's syndrome 07/21/2006  . Suicide attempt [facility])    Due to Prednisone!  Marland Kitchen VASCULITIS 05/23/2007   Past Surgical History:  Procedure  Laterality Date  . HERNIA REPAIR  01/24/08   umbilical hernia  . INGUINAL HERNIA REPAIR     Bil  . IR VERTEBROPLASTY LUMBAR BX INC UNI/BIL INC/INJECT/IMAGING  05/31/2019    Medications: Prior to Admission medications   Medication Sig Start Date End Date Taking? Authorizing Provider  acetaminophen (TYLENOL) 500 MG tablet Take 1,000 mg by mouth as needed for moderate pain.     [provider]  acyclovir (ZOVIRAX) 400 MG tablet Take 1 tablet (400 mg total) by mouth daily. 07/26/19   Orson Slick, MD  allopurinol (ZYLOPRIM) 100 MG tablet Take 1 tablet (100 mg total) by mouth daily. 02/26/19   [name], MD  atorvastatin (LIPITOR) 40 MG tablet Take 1 tablet (40 mg total) by mouth 3 (three) times a week. 02/28/19   [name], MD  clonazePAM (KLONOPIN) 1 MG tablet Take 1 mg by mouth 2 (two) times daily as needed for anxiety. 1/2 tab    [provider]  clopidogrel (PLAVIX) 75 MG tablet Take 1 tablet (75 mg total) by mouth daily. 08/17/19 02/13/20  [name], MD  lenalidomide (REVLIMID) 10 MG capsule Take 1 capsule (10 mg total) by mouth daily. [employer] Auth # [id number]     Date Obtained 09/12/19 09/12/19   Orson Slick, MD  mirtazapine (REMERON) 15 MG tablet TAKE 1 TABLET BY MOUTH EVERYDAY AT BEDTIME Patient taking differently: Take 15 mg by mouth at bedtime.  06/28/19   Orma Flaming, MD  oxyCODONE (OXY IR/ROXICODONE)  5 MG immediate release tablet Take 1 tablet (5 mg total) by mouth every 6 (six) hours as needed for severe pain. 07/20/19   Donne Hazel, MD  pantoprazole (PROTONIX) 40 MG tablet TAKE 1 TABLET BY MOUTH EVERY DAY 08/29/19   [name], MD  polyethylene glycol (MIRALAX / GLYCOLAX) 17 g packet Take 17 g by mouth daily.    [provider]  sennosides-docusate sodium (SENOKOT-S) 8.6-50 MG tablet Take 1 tablet by mouth daily as needed for constipation.    [provider]  tamsulosin (FLOMAX) 0.4 MG CAPS capsule TAKE 1 CAPSULE BY  MOUTH EVERY DAY Patient taking differently: Take 0.4 mg by mouth daily.  05/17/19   [name], MD    Allergies:   Allergies  Allergen Reactions  . Prednisone     Patient attributes suicide attempt to prednisone use.   Marland Kitchen Amoxicillin-Pot Clavulanate Nausea And Vomiting  . Ace Inhibitors     REACTION: cough  . Codeine Phosphate     REACTION: vomiting    Social History:  reports that he has quit smoking. His smoking use included cigarettes. He has a 1.00 pack-year smoking history. He quit smokeless tobacco use about 30 years ago.  His smokeless tobacco use included chew. He reports current alcohol use of about 2.0 standard drinks of alcohol per week. He reports that he does not use drugs.  Family History: Family History  Problem Relation Age of Onset  . Other Mother        passed age 38  . Hypertension Father   . Prostate cancer Father 15       never operated, not cause of death  . Stroke Father   . Meniere's disease Father   . Early death Paternal Grandfather   . Bladder Cancer Son     Physical Exam: Vitals:   09/15/19 1815 09/15/19 1822  BP: 130/66   Pulse: 79 78  Resp: (!) 21 18  Temp:  97.8 F (36.6 C)  TempSrc:  Oral  SpO2: 98% 97%    General:  Alert and oriented times three, well developed and nourished, no acute distress Eyes: PERRLA, pink conjunctiva, no scleral icterus ENT: Moist oral mucosa, neck supple, no thyromegaly Lungs: clear to ascultation, no wheeze, no crackles, no use of accessory muscles Cardiovascular: regular rate and rhythm, no regurgitation, no gallops, no murmurs. No carotid bruits, no JVD.  No crepitation.  Mild tenderness to palpation left side. Abdomen: soft, positive BS, non-tender, non-distended, no organomegaly, not an acute abdomen GU: not examined Neuro: CN II - XII grossly intact, sensation intact Musculoskeletal: strength 5/5 all extremities, no clubbing, cyanosis or edema Skin: no rash, no subcutaneous crepitation, no  decubitus Psych: appropriate patient   Labs on Admission:  Recent Labs    09/15/19 1827  NA 136  K 5.0  CL 104  CO2 21*  GLUCOSE 103*  BUN 37*  CREATININE 0.93  CALCIUM 8.7*   Recent Labs    09/15/19 1827  AST 32  ALT 23  ALKPHOS 145*  BILITOT 0.5  PROT 6.7  ALBUMIN 3.0*   No results for input(s): LIPASE, AMYLASE in the last 72 hours. Recent Labs    09/15/19 1827  WBC 12.3*  NEUTROABS 9.8*  HGB 11.2*  HCT 36.7*  MCV 95.8  PLT 241   No results for input(s): CKTOTAL, CKMB, CKMBINDEX, TROPONINI in the last 72 hours. Invalid input(s): POCBNP No results for input(s): DDIMER in the last 72 hours. No results  for input(s): HGBA1C in the last 72 hours. No results for input(s): CHOL, HDL, LDLCALC, TRIG, CHOLHDL, LDLDIRECT in the last 72 hours. No results for input(s): TSH, T4TOTAL, T3FREE, THYROIDAB in the last 72 hours.  Invalid input(s): FREET3 No results for input(s): VITAMINB12, FOLATE, FERRITIN, TIBC, IRON, RETICCTPCT in the last 72 hours.  Micro Results: No results found for this or any previous visit (from the past 240 hour(s)).   Radiological Exams on Admission: DG Chest Port 1 View  Result Date: 09/15/2019 CLINICAL DATA:  74 year old male with syncope. History of multiple myeloma. EXAM: PORTABLE CHEST 1 VIEW COMPARISON:  Chest radiograph dated 07/16/2019. FINDINGS: There is background of emphysema. Left mid to lower lung field linear and streaky atelectasis/scarring. No lobar consolidation, pleural effusion, pneumothorax. The cardiac silhouette is within limits. Atherosclerotic calcification of the aorta. Multiple mildly displaced left lateral rib fractures, likely acute. IMPRESSION: 1. Mildly displaced left lateral rib fractures.  No pneumothorax. 2. No acute cardiopulmonary process. Electronically Signed   By: Anner Crete M.D.   On: 09/15/2019 19:43    Assessment/Plan Present on Admission: . Syncope and collapse -Bring in for overnight observation  med telemetry -IV fluid hydration ordered -Patient potassium is normal, we will add a magnesium level -We will follow up with another 3-hour troponin.  EKG currently normal, with normal QTC. -We will order orthostatic vitals  Left rib fractures -Patient with mild tenderness to palpation.  No respiratory complaints. -Pain management, continue to monitor  . Gout -Stable, home meds resumed  . Multiple myeloma without remission [facility]) -Per outpatient oncologist  Kevin Perez 09/15/2019, 10:39 PM

## 2019-09-16 ENCOUNTER — Encounter: Payer: Self-pay | Admitting: Hematology and Oncology

## 2019-09-16 DIAGNOSIS — N401 Enlarged prostate with lower urinary tract symptoms: Secondary | ICD-10-CM | POA: Diagnosis not present

## 2019-09-16 DIAGNOSIS — M1A0790 Idiopathic chronic gout, unspecified ankle and foot, without tophus (tophi): Secondary | ICD-10-CM

## 2019-09-16 DIAGNOSIS — R351 Nocturia: Secondary | ICD-10-CM

## 2019-09-16 DIAGNOSIS — C9000 Multiple myeloma not having achieved remission: Secondary | ICD-10-CM | POA: Diagnosis not present

## 2019-09-16 DIAGNOSIS — Z8673 Personal history of transient ischemic attack (TIA), and cerebral infarction without residual deficits: Secondary | ICD-10-CM

## 2019-09-16 DIAGNOSIS — R55 Syncope and collapse: Secondary | ICD-10-CM | POA: Diagnosis not present

## 2019-09-16 DIAGNOSIS — I5032 Chronic diastolic (congestive) heart failure: Secondary | ICD-10-CM

## 2019-09-16 DIAGNOSIS — N1831 Chronic kidney disease, stage 3a: Secondary | ICD-10-CM | POA: Diagnosis not present

## 2019-09-16 LAB — BASIC METABOLIC PANEL
Anion gap: 5 (ref 5–15)
BUN: 26 mg/dL — ABNORMAL HIGH (ref 8–23)
CO2: 25 mmol/L (ref 22–32)
Calcium: 8.1 mg/dL — ABNORMAL LOW (ref 8.9–10.3)
Chloride: 105 mmol/L (ref 98–111)
Creatinine, Ser: 0.83 mg/dL (ref 0.61–1.24)
GFR calc Af Amer: >60 mL/min (ref >60)
GFR calc non Af Amer: >60 mL/min (ref >60)
Glucose, Bld: 100 mg/dL — ABNORMAL HIGH (ref 70–99)
Potassium: 4.2 mmol/L (ref 3.5–5.1)
Sodium: 135 mmol/L (ref 135–145)

## 2019-09-16 LAB — MAGNESIUM
Magnesium: 2 mg/dL (ref 1.7–2.4)
Magnesium: 2.2 mg/dL (ref 1.7–2.4)

## 2019-09-16 LAB — CBC
HCT: 33.2 % — ABNORMAL LOW (ref 39.0–52.0)
Hemoglobin: 10.3 g/dL — ABNORMAL LOW (ref 13.0–17.0)
MCH: 29.8 pg (ref 26.0–34.0)
MCHC: 31 g/dL (ref 30.0–36.0)
MCV: 96 fL (ref 80.0–100.0)
Platelets: 212 10*3/uL (ref 150–400)
RBC: 3.46 MIL/uL — ABNORMAL LOW (ref 4.22–5.81)
RDW: 16.5 % — ABNORMAL HIGH (ref 11.5–15.5)
WBC: 9.4 10*3/uL (ref 4.0–10.5)
nRBC: 0 % (ref 0.0–0.2)

## 2019-09-16 LAB — SARS CORONAVIRUS 2 BY RT PCR (HOSPITAL ORDER, PERFORMED IN ~~LOC~~ HOSPITAL LAB): SARS Coronavirus 2: NEGATIVE

## 2019-09-16 MED ORDER — ONDANSETRON HCL 4 MG/2ML IJ SOLN: 4.0000 mg | Freq: Four times a day (QID) | INTRAMUSCULAR | Status: DC | PRN

## 2019-09-16 MED ORDER — ATORVASTATIN CALCIUM 40 MG PO TABS
40.0000 mg | ORAL_TABLET | ORAL | Status: DC
  Administered 2019-09-17: 40 mg via ORAL
  Filled 2019-09-16: qty 1

## 2019-09-16 MED ORDER — CLOPIDOGREL BISULFATE 75 MG PO TABS
75.0000 mg | ORAL_TABLET | Freq: Every day | ORAL | Status: DC
  Administered 2019-09-16 – 2019-09-17 (×2): 75 mg via ORAL
  Filled 2019-09-16 (×2): qty 1

## 2019-09-16 MED ORDER — FUROSEMIDE 10 MG/ML IJ SOLN
20.0000 mg | Freq: Once | INTRAMUSCULAR | Status: AC
  Administered 2019-09-16: 20 mg via INTRAVENOUS

## 2019-09-16 MED ORDER — POLYETHYLENE GLYCOL 3350 17 G PO PACK: 17.0000 g | PACK | Freq: Every day | ORAL | Status: DC | PRN

## 2019-09-16 MED ORDER — LENALIDOMIDE 10 MG PO CAPS: 10.0000 mg | ORAL_CAPSULE | Freq: Every day | ORAL | Status: DC

## 2019-09-16 MED ORDER — CLONAZEPAM 0.5 MG PO TABS: 0.2500 mg | ORAL_TABLET | Freq: Once | ORAL | Status: DC

## 2019-09-16 MED ORDER — POLYETHYLENE GLYCOL 3350 17 G PO PACK
17.0000 g | PACK | Freq: Every day | ORAL | Status: DC
  Filled 2019-09-16: qty 1

## 2019-09-16 MED ORDER — ENOXAPARIN SODIUM 30 MG/0.3ML ~~LOC~~ SOLN
30.0000 mg | SUBCUTANEOUS | Status: DC
  Administered 2019-09-16 – 2019-09-17 (×2): 30 mg via SUBCUTANEOUS
  Filled 2019-09-16 (×2): qty 0.3

## 2019-09-16 MED ORDER — ACYCLOVIR 400 MG PO TABS
400.0000 mg | ORAL_TABLET | Freq: Every day | ORAL | Status: DC
  Administered 2019-09-16 – 2019-09-17 (×2): 400 mg via ORAL
  Filled 2019-09-16 (×2): qty 1

## 2019-09-16 MED ORDER — MIRTAZAPINE 15 MG PO TABS: 15.0000 mg | ORAL_TABLET | Freq: Every day | ORAL | Status: DC

## 2019-09-16 MED ORDER — CLONIDINE HCL 0.1 MG PO TABS: 0.1000 mg | ORAL_TABLET | Freq: Four times a day (QID) | ORAL | Status: DC | PRN

## 2019-09-16 MED ORDER — PANTOPRAZOLE SODIUM 40 MG PO TBEC
40.0000 mg | DELAYED_RELEASE_TABLET | Freq: Every day | ORAL | Status: DC
  Administered 2019-09-16 – 2019-09-17 (×2): 40 mg via ORAL
  Filled 2019-09-16 (×2): qty 1

## 2019-09-16 MED ORDER — LIDOCAINE 5 % EX PTCH
1.0000 | MEDICATED_PATCH | CUTANEOUS | Status: DC
  Administered 2019-09-16 – 2019-09-17 (×2): 1 via TRANSDERMAL
  Filled 2019-09-16 (×2): qty 1

## 2019-09-16 MED ORDER — CLONAZEPAM 0.5 MG PO TABS: 0.5000 mg | ORAL_TABLET | Freq: Two times a day (BID) | ORAL | Status: DC | PRN

## 2019-09-16 MED ORDER — TAMSULOSIN HCL 0.4 MG PO CAPS
0.4000 mg | ORAL_CAPSULE | Freq: Every day | ORAL | Status: DC
  Administered 2019-09-16 – 2019-09-17 (×2): 0.4 mg via ORAL
  Filled 2019-09-16 (×2): qty 1

## 2019-09-16 MED ORDER — ACETAMINOPHEN 650 MG RE SUPP: 650.0000 mg | Freq: Four times a day (QID) | RECTAL | Status: DC | PRN

## 2019-09-16 MED ORDER — OXYCODONE HCL 5 MG PO TABS: 5.0000 mg | ORAL_TABLET | Freq: Four times a day (QID) | ORAL | Status: DC | PRN

## 2019-09-16 MED ORDER — ACETAMINOPHEN 325 MG PO TABS: 650.0000 mg | ORAL_TABLET | Freq: Four times a day (QID) | ORAL | Status: DC | PRN

## 2019-09-16 MED ORDER — ALLOPURINOL 100 MG PO TABS
100.0000 mg | ORAL_TABLET | Freq: Every day | ORAL | Status: DC
  Administered 2019-09-16 – 2019-09-17 (×2): 100 mg via ORAL
  Filled 2019-09-16 (×2): qty 1

## 2019-09-16 MED ORDER — ONDANSETRON HCL 4 MG PO TABS: 4.0000 mg | ORAL_TABLET | Freq: Four times a day (QID) | ORAL | Status: DC | PRN

## 2019-09-16 MED ORDER — FUROSEMIDE 10 MG/ML IJ SOLN
INTRAMUSCULAR | Status: AC
  Filled 2019-09-16: qty 2

## 2019-09-16 MED ORDER — MIRTAZAPINE 7.5 MG PO TABS
7.5000 mg | ORAL_TABLET | Freq: Every day | ORAL | Status: DC
  Administered 2019-09-16: 7.5 mg via ORAL
  Filled 2019-09-16: qty 1

## 2019-09-16 MED ORDER — SENNOSIDES-DOCUSATE SODIUM 8.6-50 MG PO TABS: 1.0000 | ORAL_TABLET | Freq: Every day | ORAL | Status: DC | PRN
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
## 2019-09-16 NOTE — Progress Notes (Signed)
Patient began to get very anxious and fidgety. He stated "I fell so full in my stomach." Patient has started to urinate very frequently about every 15 minutes only 50cc at a time. I did a bladder scan post void and there was right at 400 cc on scanner. I notified MD and he stated he would place an order for foley

## 2019-09-16 NOTE — Care Management Obs Status (Addendum)
[facility] NOTIFICATION   Patient Details  Name: Kevin Perez MRN: [id number] Date of Birth: 10-30-1945   Medicare Observation Status Notification Given:   YEs    Claudie Leach, RN 09/16/2019, 6:32 PM

## 2019-09-16 NOTE — Progress Notes (Signed)
Late entry: Over the course of the morning, the patient progressively got more anxious to the point of becoming markedly short of breath. He was only on 2l [place] and his sats were in the upper 80s but it appeared that his anxiety was causing him to become tachypneic. I was in contact quite frequently with Dr. Louanne Belton and I also called the patients son, Gerald Stabs to see if the patient is ever like this at home. He stated that he is anxious but he doesn't give him anything for his nerves bc it sedates him too much. The patient at one point stated he "felt full" around his stomach. I spoke with the MD and he d/c'd his IVF and gave me an order for lasix. We gave the lasix and I noticed the patient continued to urinate without getting anymore than 50 to 100 cc out with each void. I chose to do a bladder scan for a PVR. Patient urinated 100 cc and bladder scan stated he had over 500 cc in bladder. I notified MD and he stated to do an I/0 cath. I did this with the Rapid Response RN Saralyn Pilar under sterile procedure after doing perineal care. We got 850cc of clear, yellow urine and almost immediately, patient became very comfortable, denies any anxiety and stated he "felt a lot better." Patients son updated. Patient has rested comfortably since I/O cath. MD made aware
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
## 2019-09-16 NOTE — Evaluation (Signed)
Physical Therapy Evaluation Patient Details Name: Kevin Perez MRN: [id number] DOB: 07/24/1945 Today's Date: 09/16/2019   History of Present Illness  74 year old male with history of melanoma, on q. weekly chemotherapy for the past 4 weeks.  His last chemotherapy session was Monday.  Today he was out walking with his son.  He states he became overheated and wanted to stop.  His son encouraged him to go a few more steps. He then syncopized on the front yard.  It was a witnessed syncopal event.  He was out for 2 to 3 minutes. Pt also found to have multiple L rib fxs.  Clinical Impression  Pt presents to PT with deficits in functional mobility, gait, balance, endurance, strength, and power. Pt is able to ambulate for household distances without physical assistance at this time. Pt denies any symptoms of orthostatic BP during session and vitals remain stable. Pt is generally weak and frail and will benefit from continued acute PT POC and HHPT at the time of discharge to improve strength and activity tolerance. PT encourages the pt to utilize RW for all ambulation to reduce falls risk.    Follow Up Recommendations Home health PT;Supervision for mobility/OOB    Equipment Recommendations  None recommended by PT (pt owns necessary DME)    Recommendations for Other Services       Precautions / Restrictions Precautions Precautions: Fall Restrictions Weight Bearing Restrictions: No      Mobility  Bed Mobility Overal bed mobility: Modified Independent             General bed mobility comments: increased time  Transfers Overall transfer level: Needs assistance Equipment used: None Transfers: Sit to/from Stand Sit to Stand: Supervision            Ambulation/Gait Ambulation/Gait assistance: Supervision Gait Distance (Feet): 80 Feet Assistive device: None Gait Pattern/deviations: Step-through pattern Gait velocity: functional Gait velocity interpretation: >2.62 ft/sec,  indicative of community ambulatory General Gait Details: pt with shortened step through gait, brisk gait speed when ambulating in room  Stairs            Wheelchair Mobility    Modified Rankin (Stroke Patients Only)       Balance Overall balance assessment: Needs assistance Sitting-balance support: Feet supported;No upper extremity supported;Single extremity supported Sitting balance-Leahy Scale: Fair Sitting balance - Comments: pt is able to maintain balance without UE support for brief 10-15 second periods in sitting   Standing balance support: No upper extremity supported Standing balance-Leahy Scale: Poor Standing balance comment: pt is able to maintain static standing balance for brief periods without UE support (15-30 seconds), otherwise requires unilateral UE support                             Pertinent Vitals/Pain Pain Assessment: Faces Faces Pain Scale: Hurts little more Pain Location: ribs Pain Descriptors / Indicators: Grimacing Pain Intervention(s): Monitored during session    Home Living Family/patient expects to be discharged to:: Private residence Living Arrangements: Children Available Help at Discharge: Family;Available PRN/intermittently (near 24/7 per pt) Type of Home: House Home Access: Stairs to enter Entrance Stairs-Rails: Chemical engineer of Steps: 3 Home Layout: One level Home Equipment: Shower seat - built in;Walker - 2 wheels      Prior Function Level of Independence: Needs assistance   Gait / Transfers Assistance Needed: pt reports ambulating with a RW household and limited community distances     Comments: pt may  be unreliable historian, reports he had home health therapies prior to admission however RN reports that pt's son states no services were coming to home     Hand Dominance   Dominant Hand: Right    Extremity/Trunk Assessment   Upper Extremity Assessment Upper Extremity Assessment: Overall  WFL for tasks assessed    Lower Extremity Assessment Lower Extremity Assessment: Overall WFL for tasks assessed    Cervical / Trunk Assessment Cervical / Trunk Assessment: Kyphotic  Communication   Communication: No difficulties  Cognition Arousal/Alertness: Awake/alert Behavior During Therapy: Anxious Overall Cognitive Status: No family/caregiver present to determine baseline cognitive functioning                                 General Comments: pt follows commands consistently, demonstrates good awareness of deficits, pt appears anxious and PT encourages the pt to perform deep breathing throughout session      General Comments General comments (skin integrity, edema, etc.): pt on 3L [place] upon arrival, PT weans pt to room air with stable sats in mid-90s. Sats remain stable with activity in mid-90s. Orthostatic BPs: supine 144/73, sitting 138/82, standing 159/85. Pt denies symptoms    Exercises     Assessment/Plan    PT Assessment Patient needs continued PT services  PT Problem List Decreased strength;Decreased activity tolerance;Decreased balance;Decreased mobility;Decreased knowledge of use of DME;Decreased safety awareness;Cardiopulmonary status limiting activity;Decreased knowledge of precautions;Pain       PT Treatment Interventions Gait training;DME instruction;Stair training;Functional mobility training;Balance training;Neuromuscular re-education;Therapeutic activities;Therapeutic exercise;Patient/family education    PT Goals (Current goals can be found in the Care Plan section)  Acute Rehab PT Goals Patient Stated Goal: To return home PT Goal Formulation: With patient Time For Goal Achievement: 09/30/19 Potential to Achieve Goals: Good    Frequency Min 3X/week   Barriers to discharge        Co-evaluation               AM-PAC PT "6 Clicks" Mobility  Outcome Measure Help needed turning from your back to your side while in a flat bed without  using bedrails?: None Help needed moving from lying on your back to sitting on the side of a flat bed without using bedrails?: None Help needed moving to and from a bed to a chair (including a wheelchair)?: None Help needed standing up from a chair using your arms (e.g., wheelchair or bedside chair)?: None Help needed to walk in hospital room?: None Help needed climbing 3-5 steps with a railing? : A Little 6 Click Score: 23    End of Session Equipment Utilized During Treatment: Oxygen Activity Tolerance: Patient tolerated treatment well Patient left: in chair;with call bell/phone within reach Nurse Communication: Mobility status PT Visit Diagnosis: Other abnormalities of gait and mobility (R26.89);Muscle weakness (generalized) (M62.81);History of falling (Z91.81)    Time: 0071-2197 PT Time Calculation (min) (ACUTE ONLY): 27 min   Charges:   PT Evaluation $PT Eval Low Complexity: 1 Low PT Treatments $Gait Training: 8-22 mins        Zenaida Niece, PT, DPT Acute Rehabilitation Pager: [phone]   Zenaida Niece 09/16/2019, 5:08 PM

## 2019-09-16 NOTE — Progress Notes (Signed)
Per protocol, we need to in and out cath again before inserting foley, Dr. Louanne Belton made aware and d/c'd foley and place I/o cath. I and o cath done after doing perineal care and under sterile technique with Janett Billow, RN. 500 cc out with I/O cath. I sent a text page to MD. Patient more comfortable now. Will continue to monitor and report to night shift RN

## 2019-09-16 NOTE — Progress Notes (Signed)
PROGRESS NOTE  Kevin Perez [facility] MWU:[id number] DOB: 1945/06/15 DOA: 09/15/2019 PCP: [name], MD   LOS: 0 days   Brief narrative: As per HPI,  This is a 74 year old male with history of multiple melanoma, on q. weekly chemotherapy for the past 4 weeks, last chemotherapy session was Monday was out walking with his son when he became overheated and wanted to stop.  His son encouraged him to go a few more steps. He then syncopized on the front yard.  It was a witnessed syncopal event.  He was out for 2 to 3 minutes.  He states that he was little lightheaded prior to syncopizing.  He denied any chest pains prior to the event.  He denies any postictal symptoms.  There is no report of any seizure type activity.  He stated that his appetite had been decreased for the past few weeks.   He is on pured or mechanical soft meals.  His son called 911 after he syncopized. En route to the hospital he had a run of V. tach which self resolved.  His troponin was mildly bumped.  Hospitalist was asked to admit for overnight evaluation.  Assessment/Plan:  Principal Problem:   Syncope and collapse Active Problems:   Gout   BPH associated with nocturia   Multiple myeloma without remission (HCC)   History of CVA (cerebrovascular accident)   CKD (chronic kidney disease), stage III   Chronic diastolic CHF (congestive heart failure) (HCC)  Syncope and collapse Continue to monitor closely.   EKG normal, with normal QTC. Will obtain orthostatic vitals as able.  Could be vasovagal or secondary to volume depletion.  But will need to monitor cardiac isues. Electrolytes normal. 2D echocardiogram showed LV ejection fraction of 60 to 65%.  Diastolic dysfunction was noted.  Cardiac ultrasound showed less than 39% stenosis of ICA bilaterally.  Continue to monitor.  Agitation anxiety, restlessness. Likely urinary retention.  Also complained of abdominal distention.  Will consider Foley catheter if continues to  retain.  Status post fall with left rib fractures Patient with mild tenderness to palpation. Add incentive spirometry. No overt pain. Add lidoderm patch. Will need PT OT evaluation and ambulation.  Gout On allopurinol  Multiple myeloma without remission  Follow up with oncology as outpatient. Gets weekly chemo.  Acute urinary retention:  Patient had retention and required in and out cath. Will discontinue IV Fluids.  Might need Foley catheter placement.  DVT prophylaxis: enoxaparin (LOVENOX) injection 30 mg Start: 09/16/19 1000 SCDs Start: 09/16/19 0127   Code Status: Full code  Family Communication: None  Status is: Observation  The patient will require care spanning > 2 midnights and should be moved to inpatient because: Unsafe d/c plan, Inpatient level of care appropriate due to severity of illness and Physical therapy evaluation, urinary retention.  Dispo: The patient is from: Home              Anticipated d/c is to: Likely home with home health, pending PT evaluation, continue to monitor, orthostatic vitals, monitor for urinary retention.              Anticipated d/c date is: 2 days              Patient currently is not medically stable to d/c.  Consultants:  None  Procedures:  None  Antibiotics:  . None  Anti-infectives (From admission, onward)   Start     Dose/Rate Route Frequency Ordered Stop   09/16/19 1000  acyclovir (  ZOVIRAX) tablet 400 mg        400 mg Oral Daily 09/16/19 0127       Subjective: Today, patient was seen and examined at bedside.  Nursing staff reported was anxious agitated and restless with feeling of congestion and abdominal distention.  Objective: Vitals:   09/16/19 0738 09/16/19 1032  BP: 139/72 122/74  Pulse: 60   Resp:    Temp:    SpO2: 97%     Intake/Output Summary (Last 24 hours) at 09/16/2019 1145 Last data filed at 09/16/2019 1038 Gross per 24 hour  Intake 1015.69 ml  Output 875 ml  Net 140.69 ml   Filed Weights    09/16/19 0106 09/16/19 0425  Weight: 56 kg 59.8 kg   Body mass index is 21.28 kg/m.   Physical Exam: GENERAL: Patient is alert awake and oriented.  Mildly restless, on nasal cannula oxygen HENT: No scleral pallor or icterus. Pupils equally reactive to light. Oral mucosa is moist NECK: is supple, no gross swelling noted. CHEST: Clear to auscultation. No crackles or wheezes.  Diminished breath sounds bilaterally.  Left chest wall tenderness on palpation. CVS: S1 and S2 heard, no murmur. Regular rate and rhythm.  ABDOMEN: Soft, non-tender, bowel sounds are present. EXTREMITIES: No edema. CNS: Cranial nerves are intact. No focal motor deficits. SKIN: warm and dry without rashes.  Data Review: I have personally reviewed the following laboratory data and studies,  CBC: Recent Labs  Lab 09/10/19 1105 09/15/19 1827 09/16/19 0314  WBC 13.8* 12.3* 9.4  NEUTROABS 11.6* 9.8*  --   HGB 11.5* 11.2* 10.3*  HCT 36.1* 36.7* 33.2*  MCV 91.9 95.8 96.0  PLT 300 241 295   Basic Metabolic Panel: Recent Labs  Lab 09/10/19 1105 09/15/19 1827 09/15/19 2000 09/16/19 0314  NA 135 136  --  135  K 4.9 5.0  --  4.2  CL 103 104  --  105  CO2 23 21*  --  25  GLUCOSE 119* 103*  --  100*  BUN 36* 37*  --  26*  CREATININE 0.98 0.93  --  0.83  CALCIUM 10.1 8.7*  --  8.1*  MG  --   --  2.0 2.2   Liver Function Tests: Recent Labs  Lab 09/10/19 1105 09/15/19 1827  AST 15 32  ALT 16 23  ALKPHOS 187* 145*  BILITOT 0.3 0.5  PROT 8.0 6.7  ALBUMIN 3.1* 3.0*   No results for input(s): LIPASE, AMYLASE in the last 168 hours. No results for input(s): AMMONIA in the last 168 hours. Cardiac Enzymes: No results for input(s): CKTOTAL, CKMB, CKMBINDEX, TROPONINI in the last 168 hours. BNP (last 3 results) No results for input(s): BNP in the last 8760 hours.  ProBNP (last 3 results) No results for input(s): PROBNP in the last 8760 hours.  CBG: Recent Labs  Lab 09/15/19 1808  GLUCAP 106*    Recent Results (from the past 240 hour(s))  SARS Coronavirus 2 by RT PCR (hospital order, performed in [facility] hospital lab) Nasopharyngeal Nasopharyngeal Swab     Status: None   Collection Time: 09/15/19  9:20 PM   Specimen: Nasopharyngeal Swab  Result Value Ref Range Status   SARS Coronavirus 2 NEGATIVE NEGATIVE Final    Comment: (NOTE) SARS-CoV-2 target nucleic acids are NOT DETECTED.  The SARS-CoV-2 RNA is generally detectable in upper and lower respiratory specimens during the acute phase of infection. The lowest concentration of SARS-CoV-2 viral copies this assay can detect is 250  copies / mL. A negative result does not preclude SARS-CoV-2 infection and should not be used as the sole basis for treatment or other patient management decisions.  A negative result may occur with improper specimen collection / handling, submission of specimen other than nasopharyngeal swab, presence of viral mutation(s) within the areas targeted by this assay, and inadequate number of viral copies (<250 copies / mL). A negative result must be combined with clinical observations, patient history, and epidemiological information.  Fact Sheet for Patients:   StrictlyIdeas.no  Fact Sheet for Healthcare Providers: BankingDealers.co.za  This test is not yet approved or  cleared by the [place] FDA and has been authorized for detection and/or diagnosis of SARS-CoV-2 by FDA under an Emergency Use Authorization (EUA).  This EUA will remain in effect (meaning this test can be used) for the duration of the COVID-19 declaration under Section 564(b)(1) of the Act, 21 U.S.C. section 360bbb-3(b)(1), unless the authorization is terminated or revoked sooner.  Performed at [facility], [place] [address] [place], [place] [address]      Studies: DG Chest [place] 1 View  Result Date: 09/15/2019 CLINICAL DATA:  74 year old male with syncope. History  of multiple myeloma. EXAM: PORTABLE CHEST 1 VIEW COMPARISON:  Chest radiograph dated 07/16/2019. FINDINGS: There is background of emphysema. Left mid to lower lung field linear and streaky atelectasis/scarring. No lobar consolidation, pleural effusion, pneumothorax. The cardiac silhouette is within limits. Atherosclerotic calcification of the aorta. Multiple mildly displaced left lateral rib fractures, likely acute. IMPRESSION: 1. Mildly displaced left lateral rib fractures.  No pneumothorax. 2. No acute cardiopulmonary process. Electronically Signed   By: Anner Crete M.D.   On: 09/15/2019 19:43      Flora Lipps, MD  [facility] 09/16/2019

## 2019-09-17 ENCOUNTER — Inpatient Hospital Stay: Payer: Medicare Other

## 2019-09-17 ENCOUNTER — Other Ambulatory Visit: Payer: Medicare Other

## 2019-09-17 ENCOUNTER — Ambulatory Visit: Payer: Medicare Other

## 2019-09-17 DIAGNOSIS — N1831 Chronic kidney disease, stage 3a: Secondary | ICD-10-CM | POA: Diagnosis not present

## 2019-09-17 DIAGNOSIS — C9000 Multiple myeloma not having achieved remission: Secondary | ICD-10-CM | POA: Diagnosis not present

## 2019-09-17 DIAGNOSIS — N401 Enlarged prostate with lower urinary tract symptoms: Secondary | ICD-10-CM | POA: Diagnosis not present

## 2019-09-17 DIAGNOSIS — I5032 Chronic diastolic (congestive) heart failure: Secondary | ICD-10-CM | POA: Diagnosis not present

## 2019-09-17 DIAGNOSIS — R351 Nocturia: Secondary | ICD-10-CM | POA: Diagnosis not present

## 2019-09-17 DIAGNOSIS — Z8673 Personal history of transient ischemic attack (TIA), and cerebral infarction without residual deficits: Secondary | ICD-10-CM | POA: Diagnosis not present

## 2019-09-17 DIAGNOSIS — R55 Syncope and collapse: Secondary | ICD-10-CM | POA: Diagnosis not present

## 2019-09-17 DIAGNOSIS — M1A0790 Idiopathic chronic gout, unspecified ankle and foot, without tophus (tophi): Secondary | ICD-10-CM | POA: Diagnosis not present

## 2019-09-17 LAB — BASIC METABOLIC PANEL
Anion gap: 10 (ref 5–15)
BUN: 26 mg/dL — ABNORMAL HIGH (ref 8–23)
CO2: 28 mmol/L (ref 22–32)
Calcium: 9.2 mg/dL (ref 8.9–10.3)
Chloride: 99 mmol/L (ref 98–111)
Creatinine, Ser: 0.91 mg/dL (ref 0.61–1.24)
GFR calc Af Amer: >60 mL/min (ref >60)
GFR calc non Af Amer: >60 mL/min (ref >60)
Glucose, Bld: 104 mg/dL — ABNORMAL HIGH (ref 70–99)
Potassium: 4.4 mmol/L (ref 3.5–5.1)
Sodium: 137 mmol/L (ref 135–145)

## 2019-09-17 LAB — CBC
HCT: 36.4 % — ABNORMAL LOW (ref 39.0–52.0)
Hemoglobin: 11.1 g/dL — ABNORMAL LOW (ref 13.0–17.0)
MCH: 28.6 pg (ref 26.0–34.0)
MCHC: 30.5 g/dL (ref 30.0–36.0)
MCV: 93.8 fL (ref 80.0–100.0)
Platelets: 225 10*3/uL (ref 150–400)
RBC: 3.88 MIL/uL — ABNORMAL LOW (ref 4.22–5.81)
RDW: 16.4 % — ABNORMAL HIGH (ref 11.5–15.5)
WBC: 9.8 10*3/uL (ref 4.0–10.5)
nRBC: 0 % (ref 0.0–0.2)

## 2019-09-17 LAB — MAGNESIUM: Magnesium: 2.1 mg/dL (ref 1.7–2.4)

## 2019-09-17 NOTE — Discharge Summary (Signed)
Physician Discharge Summary  Kevin Perez [facility] VCB:[id number] DOB: Jun 12, 1945 DOA: 09/15/2019  PCP: [name], MD  Admit date: 09/15/2019 Discharge date: 09/17/2019  Admitted From: Home  Discharge disposition: Home Health   Recommendations for Outpatient Follow-Up:   . Follow up with your primary care provider in one week.  . Check CBC, BMP, magnesium in the next visit . Follow-up with hem- oncology as scheduled for chemotherapy.   . Follow-up with urologist as has been scheduled by you.  Discharge Diagnosis:   Principal Problem:   Syncope and collapse Active Problems:   Gout   BPH associated with nocturia   Multiple myeloma without remission (HCC)   History of CVA (cerebrovascular accident)   CKD (chronic kidney disease), stage III   Chronic diastolic CHF (congestive heart failure) ([place])    Discharge Condition: Improved.  Diet recommendation: Low sodium, heart healthy.    Wound care: None.  Code status: Full.   History of Present Illness:  This is a 74 year old male with history of multiple melanoma, on q. weekly chemotherapy for the past 4 weeks, last chemotherapy session was Monday was out walking with his son when he became overheated and wanted to stop. His son encouraged him to go a few more steps. He then syncopized on the front yard. It was a witnessed syncopal event. He was out for 2 to 3 minutes. He states that he was little lightheaded prior to syncopizing. He denied any chest pains prior to the event. He denied any postictal symptoms. There was no report of any seizure type activity. Patient stated that his appetite had been decreased for the past few weeks.  He is on pured or mechanical soft meals. His son called 911 after he syncopized. En route to the hospital, he had a run of V. tach which self resolved. His troponin was mildly bumped. Hospitalist was asked to admit for overnight evaluation.    Hospital Course:   Following conditions  were addressed during hospitalization as listed below,  Syncope and collapse Could be vasovagal. EKG normal, with normal QTC. Will obtain orthostatic vitals as able.  2D echocardiogram showed LV ejection fraction of 60 to 65%.  Diastolic dysfunction was noted.  Cardiac ultrasound showed less than 39% stenosis of ICA bilaterally.  Orthostatic vitals were negative.  Patient was able to ambulate better.  He received IV fluid hydration with improvement.  Physical therapy saw the patient and recommend home health PT on discharge.  Status post fall with left rib fractures No overt pain reported.  Encourage deep breathing.  Gout On allopurinol  Multiple myeloma without remission  Follow up with oncology as outpatient. Gets weekly chemo.  History of BPH/acute urinary retention:  Patient had retention and required in and out cath x2 but subsequently was able to void by himself.  He did not wish to be on a Foley catheter.  He stated that he did have a outpatient follow-up with urology next month.  I strongly advised him to keep that appointment and would recommend continuation of tamsulosin.  Spoke with the patient's son about it as well  Disposition.  At this time, patient is stable for disposition home with home health.  Spoke with the patient's son regarding the plan for disposition and follow-up.  Medical Consultants:    None.  Procedures:    In and out catheterization x2 Subjective:   Today, patient was seen and examined at bedside.  Denies any dizziness lightheadedness shortness of breath cough fever.  Wishes to go home.  Was able to urinate by himself.  Discharge Exam:   Vitals:   09/16/19 2300 09/17/19 0448  BP: 112/68 110/68  Pulse:    Resp: 18 18  Temp: 98 F (36.7 C) 98 F (36.7 C)  SpO2:     Vitals:   09/16/19 1425 09/16/19 1925 09/16/19 2300 09/17/19 0448  BP: 109/63 118/70 112/68 110/68  Pulse: 74     Resp: _0 Temp: 98.4 F (36.9 C) 98.8 F (37.1  C) 98 F (36.7 C) 98 F (36.7 C)  TempSrc: Oral Oral Oral Oral  SpO2: 97%     Weight:    53.5 kg   General: Alert awake, not in obvious distress, thinly built, HENT: pupils equally reacting to light,  No scleral pallor or icterus noted. Oral mucosa is moist.  Chest:    Diminished breath sounds bilaterally. No crackles or wheezes.  CVS: S1 &S2 heard. No murmur.  Regular rate and rhythm. Abdomen: Soft, nontender, nondistended.  Bowel sounds are heard.   Extremities: No cyanosis, clubbing or edema.  Peripheral pulses are palpable. Psych: Alert, awake and oriented, normal mood CNS:  No cranial nerve deficits.  Power equal in all extremities.   Skin: Warm and dry.  No rashes noted.  The results of significant diagnostics from this hospitalization (including imaging, microbiology, ancillary and laboratory) are listed below for reference.     Diagnostic Studies:   DG Chest Port 1 View  Result Date: 09/15/2019 CLINICAL DATA:  74 year old male with syncope. History of multiple myeloma. EXAM: PORTABLE CHEST 1 VIEW COMPARISON:  Chest radiograph dated 07/16/2019. FINDINGS: There is background of emphysema. Left mid to lower lung field linear and streaky atelectasis/scarring. No lobar consolidation, pleural effusion, pneumothorax. The cardiac silhouette is within limits. Atherosclerotic calcification of the aorta. Multiple mildly displaced left lateral rib fractures, likely acute. IMPRESSION: 1. Mildly displaced left lateral rib fractures.  No pneumothorax. 2. No acute cardiopulmonary process. Electronically Signed   By: Anner Crete M.D.   On: 09/15/2019 19:43    Labs:   Basic Metabolic Panel: Recent Labs  Lab 09/10/19 1105 09/10/19 1105 09/15/19 1827 09/15/19 1827 09/15/19 2000 09/16/19 0314 09/17/19 0125  NA 135  --  136  --   --  135 137  K 4.9   < > 5.0   < >  --  4.2 4.4  CL 103  --  104  --   --  105 99  CO2 23  --  21*  --   --  25 28  GLUCOSE 119*  --  103*  --   --  100*  104*  BUN 36*  --  37*  --   --  26* 26*  CREATININE 0.98  --  0.93  --   --  0.83 0.91  CALCIUM 10.1  --  8.7*  --   --  8.1* 9.2  MG  --   --   --   --  2.0 2.2 2.1   < > = values in this interval not displayed.   GFR Estimated Creatinine Clearance: 53.9 mL/min (by C-G formula based on SCr of 0.91 mg/dL). Liver Function Tests: Recent Labs  Lab 09/10/19 1105 09/15/19 1827  AST 15 32  ALT 16 23  ALKPHOS 187* 145*  BILITOT 0.3 0.5  PROT 8.0 6.7  ALBUMIN 3.1* 3.0*   No results for input(s): LIPASE, AMYLASE in the last 168 hours. No results for input(s):  AMMONIA in the last 168 hours. Coagulation profile No results for input(s): INR, PROTIME in the last 168 hours.  CBC: Recent Labs  Lab 09/10/19 1105 09/15/19 1827 09/16/19 0314 09/17/19 0125  WBC 13.8* 12.3* 9.4 9.8  NEUTROABS 11.6* 9.8*  --   --   HGB 11.5* 11.2* 10.3* 11.1*  HCT 36.1* 36.7* 33.2* 36.4*  MCV 91.9 95.8 96.0 93.8  PLT 300 241 212 225   Cardiac Enzymes: No results for input(s): CKTOTAL, CKMB, CKMBINDEX, TROPONINI in the last 168 hours. BNP: Invalid input(s): POCBNP CBG: Recent Labs  Lab 09/15/19 1808  GLUCAP 106*   D-Dimer No results for input(s): DDIMER in the last 72 hours. Hgb A1c No results for input(s): HGBA1C in the last 72 hours. Lipid Profile No results for input(s): CHOL, HDL, LDLCALC, TRIG, CHOLHDL, LDLDIRECT in the last 72 hours. Thyroid function studies No results for input(s): TSH, T4TOTAL, T3FREE, THYROIDAB in the last 72 hours.  Invalid input(s): FREET3 Anemia work up No results for input(s): VITAMINB12, FOLATE, FERRITIN, TIBC, IRON, RETICCTPCT in the last 72 hours. Microbiology Recent Results (from the past 240 hour(s))  SARS Coronavirus 2 by RT PCR (hospital order, performed in [facility] hospital lab) Nasopharyngeal Nasopharyngeal Swab     Status: None   Collection Time: 09/15/19  9:20 PM   Specimen: Nasopharyngeal Swab  Result Value Ref Range Status   SARS Coronavirus  2 NEGATIVE NEGATIVE Final    Comment: (NOTE) SARS-CoV-2 target nucleic acids are NOT DETECTED.  The SARS-CoV-2 RNA is generally detectable in upper and lower respiratory specimens during the acute phase of infection. The lowest concentration of SARS-CoV-2 viral copies this assay can detect is 250 copies / mL. A negative result does not preclude SARS-CoV-2 infection and should not be used as the sole basis for treatment or other patient management decisions.  A negative result may occur with improper specimen collection / handling, submission of specimen other than nasopharyngeal swab, presence of viral mutation(s) within the areas targeted by this assay, and inadequate number of viral copies (<250 copies / mL). A negative result must be combined with clinical observations, patient history, and epidemiological information.  Fact Sheet for Patients:   StrictlyIdeas.no  Fact Sheet for Healthcare Providers: BankingDealers.co.za  This test is not yet approved or  cleared by the [place] FDA and has been authorized for detection and/or diagnosis of SARS-CoV-2 by FDA under an Emergency Use Authorization (EUA).  This EUA will remain in effect (meaning this test can be used) for the duration of the COVID-19 declaration under Section 564(b)(1) of the Act, 21 U.S.C. section 360bbb-3(b)(1), unless the authorization is terminated or revoked sooner.  Performed at [facility], [place] [address], [place], [place] [address]      Discharge Instructions:   Discharge Instructions    Diet - low sodium heart healthy   Complete by: As directed    Discharge instructions   Complete by: As directed    Follow up with your primary care provider in one week. Continue to follow up with your hematology doctor as scheduled. Increase fluid intake. Follow up with your urologist for your history of BPH.   Increase activity slowly   Complete by: As  directed      Allergies as of 09/17/2019      Reactions   Prednisone    Patient attributes suicide attempt to prednisone use.    Amoxicillin-pot Clavulanate Nausea And Vomiting   Ace Inhibitors    REACTION: cough  Codeine Phosphate    REACTION: vomiting      Medication List    TAKE these medications   acetaminophen 500 MG tablet Commonly known as: TYLENOL Take 1,000 mg by mouth as needed for moderate pain.   acyclovir 400 MG tablet Commonly known as: ZOVIRAX Take 1 tablet (400 mg total) by mouth daily.   allopurinol 100 MG tablet Commonly known as: ZYLOPRIM Take 1 tablet (100 mg total) by mouth daily.   atorvastatin 40 MG tablet Commonly known as: LIPITOR Take 1 tablet (40 mg total) by mouth 3 (three) times a week.   clonazePAM 1 MG tablet Commonly known as: KLONOPIN Take 1 mg by mouth 2 (two) times daily as needed for anxiety. 1/2 tab   clopidogrel 75 MG tablet Commonly known as: PLAVIX Take 1 tablet (75 mg total) by mouth daily.   lenalidomide 10 MG capsule Commonly known as: REVLIMID Take 1 capsule (10 mg total) by mouth daily. [employer] Auth # [id number]     Date Obtained 09/12/19   mirtazapine 15 MG tablet Commonly known as: REMERON TAKE 1 TABLET BY MOUTH EVERYDAY AT BEDTIME What changed: See the new instructions.   oxyCODONE 5 MG immediate release tablet Commonly known as: Oxy IR/ROXICODONE Take 1 tablet (5 mg total) by mouth every 6 (six) hours as needed for severe pain.   pantoprazole 40 MG tablet Commonly known as: PROTONIX TAKE 1 TABLET BY MOUTH EVERY DAY   polyethylene glycol 17 g packet Commonly known as: MIRALAX / GLYCOLAX Take 17 g by mouth daily.   sennosides-docusate sodium 8.6-50 MG tablet Commonly known as: SENOKOT-S Take 1 tablet by mouth daily as needed for constipation.   tamsulosin 0.4 MG Caps capsule Commonly known as: FLOMAX TAKE 1 CAPSULE BY MOUTH EVERY DAY       Follow-up Information    [name], MD. Schedule an  appointment as soon as possible for a visit in 1 week(s).   Specialty: Family Medicine Why: regular checkup and bloodwork Contact information: [place]  [phone]                Time coordinating discharge: 39 minutes  Signed:  Amadeo Coke  [facility] 09/17/2019, 10:23 AM

## 2019-09-17 NOTE — Progress Notes (Signed)
Patient was bladder scanned at 0100 and had 363mL. Repeat bladder scan showed 596mL. Patient was advised that a foley would be needed since it would be his third in and out cath in the last 24 hours. Patient refused when it was brought up and stated he was concerned about infection. Patient was educated and still refused. Will continue to monitor this patient and readdress need for foley as necessary.   Elesa Hacker, RN

## 2019-09-18 DIAGNOSIS — I677 Cerebral arteritis, not elsewhere classified: Secondary | ICD-10-CM | POA: Diagnosis not present

## 2019-09-18 DIAGNOSIS — I7300 Raynaud's syndrome without gangrene: Secondary | ICD-10-CM | POA: Diagnosis not present

## 2019-09-18 DIAGNOSIS — C9000 Multiple myeloma not having achieved remission: Secondary | ICD-10-CM | POA: Diagnosis not present

## 2019-09-18 DIAGNOSIS — I5032 Chronic diastolic (congestive) heart failure: Secondary | ICD-10-CM | POA: Diagnosis not present

## 2019-09-18 DIAGNOSIS — N183 Chronic kidney disease, stage 3 unspecified: Secondary | ICD-10-CM | POA: Diagnosis not present

## 2019-09-18 DIAGNOSIS — I130 Hypertensive heart and chronic kidney disease with heart failure and stage 1 through stage 4 chronic kidney disease, or unspecified chronic kidney disease: Secondary | ICD-10-CM | POA: Diagnosis not present

## 2019-09-19 DIAGNOSIS — N183 Chronic kidney disease, stage 3 unspecified: Secondary | ICD-10-CM | POA: Diagnosis not present

## 2019-09-19 DIAGNOSIS — I5032 Chronic diastolic (congestive) heart failure: Secondary | ICD-10-CM | POA: Diagnosis not present

## 2019-09-19 DIAGNOSIS — I677 Cerebral arteritis, not elsewhere classified: Secondary | ICD-10-CM | POA: Diagnosis not present

## 2019-09-19 DIAGNOSIS — C9000 Multiple myeloma not having achieved remission: Secondary | ICD-10-CM | POA: Diagnosis not present

## 2019-09-19 DIAGNOSIS — I130 Hypertensive heart and chronic kidney disease with heart failure and stage 1 through stage 4 chronic kidney disease, or unspecified chronic kidney disease: Secondary | ICD-10-CM | POA: Diagnosis not present

## 2019-09-19 DIAGNOSIS — I7300 Raynaud's syndrome without gangrene: Secondary | ICD-10-CM | POA: Diagnosis not present

## 2019-09-21 DIAGNOSIS — N183 Chronic kidney disease, stage 3 unspecified: Secondary | ICD-10-CM | POA: Diagnosis not present

## 2019-09-21 DIAGNOSIS — I7300 Raynaud's syndrome without gangrene: Secondary | ICD-10-CM | POA: Diagnosis not present

## 2019-09-21 DIAGNOSIS — I130 Hypertensive heart and chronic kidney disease with heart failure and stage 1 through stage 4 chronic kidney disease, or unspecified chronic kidney disease: Secondary | ICD-10-CM | POA: Diagnosis not present

## 2019-09-21 DIAGNOSIS — C9000 Multiple myeloma not having achieved remission: Secondary | ICD-10-CM | POA: Diagnosis not present

## 2019-09-21 DIAGNOSIS — I5032 Chronic diastolic (congestive) heart failure: Secondary | ICD-10-CM | POA: Diagnosis not present

## 2019-09-21 DIAGNOSIS — I677 Cerebral arteritis, not elsewhere classified: Secondary | ICD-10-CM | POA: Diagnosis not present

## 2019-09-26 ENCOUNTER — Other Ambulatory Visit: Payer: Medicare Other

## 2019-09-26 ENCOUNTER — Ambulatory Visit: Payer: Medicare Other

## 2019-09-26 ENCOUNTER — Other Ambulatory Visit: Payer: Self-pay | Admitting: Hematology and Oncology

## 2019-09-26 ENCOUNTER — Ambulatory Visit: Payer: Medicare Other | Admitting: Hematology and Oncology

## 2019-09-26 DIAGNOSIS — C9000 Multiple myeloma not having achieved remission: Secondary | ICD-10-CM | POA: Diagnosis not present

## 2019-09-26 DIAGNOSIS — I677 Cerebral arteritis, not elsewhere classified: Secondary | ICD-10-CM | POA: Diagnosis not present

## 2019-09-26 DIAGNOSIS — N183 Chronic kidney disease, stage 3 unspecified: Secondary | ICD-10-CM | POA: Diagnosis not present

## 2019-09-26 DIAGNOSIS — I5032 Chronic diastolic (congestive) heart failure: Secondary | ICD-10-CM | POA: Diagnosis not present

## 2019-09-26 DIAGNOSIS — I7300 Raynaud's syndrome without gangrene: Secondary | ICD-10-CM | POA: Diagnosis not present

## 2019-09-26 DIAGNOSIS — I130 Hypertensive heart and chronic kidney disease with heart failure and stage 1 through stage 4 chronic kidney disease, or unspecified chronic kidney disease: Secondary | ICD-10-CM | POA: Diagnosis not present

## 2019-09-26 NOTE — Progress Notes (Signed)
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 Moderately severe compression fracture of L4 with bone month edema consistent with a more recent fracture. 3) 05/31/2019: patient underwent IR vertebral body augmentation for painful compression fracture at L4 using vertebroplasty technique 4) 06/20/2019: CT C/A/P demonstrated diffuse, heterogeneously lytic, moth-eaten appearance of the osseous structures, most consistent with multiple myeloma. SPEP showed M protein 1.5, Kapp 29.9, Lambda 16.1, Ratio 1.86.  5) 06/25/2019: establish care with Dr. Lorenso Courier  6) 07/03/2019: Bone marrow biopsy confirmed plasma cell myeloma with 30% lambda restricted plasma cells.  7) 07/09/2019: presented for discussion of diagnosis/treatment. Taken to ED for severe 10/10 worsening back pain.  8) 07/16/2019: admitted to [facility] hospital for a CVA, MRI showing several punctate acute infarctions including 1 in the right cerebellum and 2 to 4 in the left occipital and parietooccipital junction region 9) 07/30/2019: Cycle 1 Day 1 of Velcade/Dexamethasone 10) 08/06/2019: Cycle 1 Day 8 of Velcade/Dexamethasone 11) 08/13/2019: Cycle 1 Day 15 of Velcade/Dexamethasone 12) 08/20/2019: Cycle 2 Day 1 of Velcade/Dexamethasone 13) 09/19/2019: Cycle 3 Day 1 of Velcade/Dexamethasone 14) 09/26/2019: Cycle 3 Day 8 of Velcade/Revlimid/Dexamethasone  Interval History:  Kevin Perez 74 y.o. male with medical history significant for IgG lambda MM who presents for a follow up visit. The patient's last visit was on 09/10/2019. In the  interim he was hospitalized on Perez for a syncopal episode.   On exam today Kevin Perez.  He notes that it was quite hot outside and he pushed himself quite hard when he had his near syncopal episode.  He reports that he had been walking all morning and doing his best to stay hydrated, but it was not quite enough.  During his Perez there was some concern for bladder distention for which he will be seeing urology later today.  Otherwise he notes that he is tolerating his treatment quite well.  He has had no neuropathy, Perez, or other symptoms related to his Velcade treatment.  Additionally he has tolerated the steroid therapy well.  Kevin Perez, Kevin Perez, Kevin Perez, Kevin Perez, Kevin Perez. A full 10 point ROS is listed below.  MEDICAL HISTORY:  Past Medical History:  Diagnosis Date  . Cancer ([place])   . GERD 07/21/2006  . Gout   . HYPERLIPIDEMIA 06/17/2009  . HYPERTENSION 07/21/2006  . Raynaud's syndrome 07/21/2006  . Suicide attempt [facility])    Due to Prednisone!  Marland Kitchen VASCULITIS 05/23/2007    SURGICAL HISTORY: Past Surgical History:  Procedure Laterality Date  . HERNIA REPAIR  0/7/68   umbilical hernia  . INGUINAL HERNIA REPAIR     Bil  . IR VERTEBROPLASTY LUMBAR BX INC UNI/BIL INC/INJECT/IMAGING  05/31/2019    SOCIAL HISTORY: Social History   Socioeconomic History  . Marital status: Married    Spouse name: Not on file  . Number of children: 2  . Years of education: Not on file  . Highest education level: Not on file  Occupational History  . Occupation: Retired   Tobacco Use  . Smoking status: Former Smoker  Packs/day: 0.05    Years: 20.00    Pack years: 1.00    Types: Cigarettes  . Smokeless tobacco: Former Systems developer    Types: [place] date: 06/26/1989  . Tobacco comment: chewed tobacco until 89 more than he smoked   Vaping Use  . Vaping Use: Never used  Substance and  Sexual Activity  . Alcohol use: Yes    Alcohol/week: 2.0 standard drinks    Types: 2 Standard drinks or equivalent per week    Comment: with meals on occasion  . Drug use: No  . Sexual activity: Not Currently  Other Topics Concern  . Not on file  Social History Narrative   Widowed 2019- was Married 1967. 2 kids. 1 grandchild. 1 greatgrandchilden.       Retired 2003 from [employer]      Hobbies: fishing, shagging, exercise, cruise, 71 GTO convertible   Social Determinants of Health   Financial Resource Strain:   . Difficulty of Paying Living Expenses: Not on file  Food Insecurity:   . Worried About Charity fundraiser in the Last Year: Not on file  . Ran Out of Food in the Last Year: Not on file  Transportation Needs:   . Lack of Transportation (Medical): Not on file  . Lack of Transportation (Non-Medical): Not on file  Physical Activity:   . Days of Exercise per Week: Not on file  . Minutes of Exercise per Session: Not on file  Stress:   . Feeling of Stress : Not on file  Social Connections:   . Frequency of Communication with Friends and Family: Not on file  . Frequency of Social Gatherings with Friends and Family: Not on file  . Attends Religious Services: Not on file  . Active Member of Clubs or Organizations: Not on file  . Attends Archivist Meetings: Not on file  . Marital Status: Not on file  Intimate Partner Violence:   . Fear of Current or Ex-Partner: Not on file  . Emotionally Abused: Not on file  . Physically Abused: Not on file  . Sexually Abused: Not on file    FAMILY HISTORY: Family History  Problem Relation Age of Onset  . Other Mother        passed age 67  . Hypertension Father   . Prostate cancer Father 61       never operated, not cause of death  . Stroke Father   . Meniere's disease Father   . Early death Paternal Grandfather   . Bladder Cancer Son     ALLERGIES:  is allergic to prednisone, amoxicillin-pot  clavulanate, ace inhibitors, and codeine phosphate.  MEDICATIONS:  Current Outpatient Medications  Medication Sig Dispense Refill  . lenalidomide (REVLIMID) 10 MG capsule Take 1 capsule (10 mg total) by mouth daily. [employer] Auth # [id number]     Date Obtained 09/12/19 14 capsule 0  . acetaminophen (TYLENOL) 500 MG tablet Take 1,000 mg by mouth as needed for moderate pain.     Marland Kitchen acyclovir (ZOVIRAX) 400 MG tablet Take 1 tablet (400 mg total) by mouth daily. 90 tablet 1  . allopurinol (ZYLOPRIM) 100 MG tablet Take 1 tablet (100 mg total) by mouth daily. 90 tablet 3  . atorvastatin (LIPITOR) 40 MG tablet Take 1 tablet (40 mg total) by mouth 3 (three) times a week. 90 tablet 3  . clonazePAM (KLONOPIN) 1 MG tablet Take 1 mg by mouth 2 (two) times daily as needed for  anxiety. 1/2 tab (Patient not taking: Reported on 09/16/2019)    . clopidogrel (PLAVIX) 75 MG tablet Take 1 tablet (75 mg total) by mouth daily. 30 tablet 5  . mirtazapine (REMERON) 15 MG tablet TAKE 1 TABLET BY MOUTH EVERYDAY AT BEDTIME (Patient taking differently: Take 15 mg by mouth at bedtime. ) 90 tablet 1  . oxyCODONE (OXY IR/ROXICODONE) 5 MG immediate release tablet Take 1 tablet (5 mg total) by mouth every 6 (six) hours as needed for severe pain. 5 tablet 0  . pantoprazole (PROTONIX) 40 MG tablet TAKE 1 TABLET BY MOUTH EVERY DAY 90 tablet 3  . polyethylene glycol (MIRALAX / GLYCOLAX) 17 g packet Take 17 g by mouth daily.    . sennosides-docusate sodium (SENOKOT-S) 8.6-50 MG tablet Take 1 tablet by mouth daily as needed for constipation.    . tamsulosin (FLOMAX) 0.4 MG CAPS capsule TAKE 1 CAPSULE BY MOUTH EVERY DAY (Patient taking differently: Take 0.4 mg by mouth daily. ) 90 capsule 1   No current facility-administered medications for this visit.    REVIEW OF SYSTEMS:   Constitutional: ( - ) Perez, ( - )  Kevin Perez , ( - ) night Kevin Perez Eyes: ( - ) blurriness of vision, ( - ) double vision, ( - ) watery eyes Ears, nose, mouth, throat,  and face: ( - ) mucositis, ( - ) sore throat Respiratory: ( - ) cough, ( - ) dyspnea, ( - ) wheezes Cardiovascular: ( - ) palpitation, ( - ) chest discomfort, ( - ) lower extremity swelling Gastrointestinal:  ( - ) Kevin Perez, ( - ) heartburn, ( - ) change in bowel habits Skin: ( - ) abnormal skin rashes Lymphatics: ( - ) new lymphadenopathy, ( - ) easy bruising Neurological: ( - ) numbness, ( - ) tingling, ( - ) new weaknesses Behavioral/Psych: ( - ) mood change, ( - ) new changes  All other systems were reviewed with the patient and are negative.  PHYSICAL EXAMINATION: ECOG PERFORMANCE STATUS: 1 - Symptomatic but completely ambulatory  Vitals:   09/27/19 1030  BP: (!) 156/83  Pulse: 81  Resp: 20  Temp: (!) 96.6 F (35.9 C)  SpO2: 97%   Filed Weights   09/27/19 1030  Weight: 123 lb 11.2 oz (56.1 kg)    GENERAL: well appearing elderly Caucasian male. Alert, no distress and comfortable SKIN: skin color, texture, turgor are normal, no rashes or significant lesions EYES: conjunctiva are pink and non-injected, sclera clear LUNGS: some wheezes and course crackles on lung exam HEART: regular rate & rhythm and no murmurs and no lower extremity edema Musculoskeletal: no cyanosis of digits and no clubbing  PSYCH: alert & oriented x 3, fluent speech NEURO: no focal motor/sensory deficits  LABORATORY DATA:  I have reviewed the data as listed CBC Latest Ref Rng & Units 09/27/2019 09/17/2019 09/16/2019  WBC 4.0 - 10.5 K/uL 8.2 9.8 9.4  Hemoglobin 13.0 - 17.0 g/dL 13.2 11.1(L) 10.3(L)  Hematocrit 39 - 52 % 41.8 36.4(L) 33.2(L)  Platelets 150 - 400 K/uL 244 225 212    CMP Latest Ref Rng & Units 09/27/2019 09/17/2019 09/16/2019  Glucose 70 - 99 mg/dL 100(H) 104(H) 100(H)  BUN 8 - 23 mg/dL 28(H) 26(H) 26(H)  Creatinine 0.61 - 1.24 mg/dL 1.04 0.91 0.83  Sodium 135 - 145 mmol/L 139 137 135  Potassium 3.5 - 5.1 mmol/L 4.8 4.4 4.2  Chloride 98 - 111 mmol/L 103 99 105  CO2 22 - 32 mmol/L 27  28 25    Calcium 8.9 - 10.3 mg/dL 10.0 9.2 8.1(L)  Total Protein 6.5 - 8.1 g/dL 8.7(H) - -  Total Bilirubin 0.3 - 1.2 mg/dL 0.4 - -  Alkaline Phos 38 - 126 U/L 180(H) - -  AST 15 - 41 U/L 17 - -  ALT 0 - 44 U/L 12 - -    Lab Results  Component Value Date   MPROTEIN 1.1 (H) 09/10/2019   Lab Results  Component Value Date   KPAFRELGTCHN 27.0 (H) 09/10/2019   LAMBDASER 24.7 09/10/2019   KAPLAMBRATIO 1.09 09/10/2019   KAPLAMBRATIO 9.97 06/27/2019     RADIOGRAPHIC STUDIES: DG Chest Port 1 View  Result Date: Perez CLINICAL DATA:  74 year old male with syncope. History of multiple myeloma. EXAM: PORTABLE CHEST 1 VIEW COMPARISON:  Chest radiograph dated 07/16/2019. FINDINGS: There is background of emphysema. Left mid to lower lung field linear and streaky atelectasis/scarring. No lobar consolidation, pleural effusion, pneumothorax. The cardiac silhouette is within limits. Atherosclerotic calcification of the aorta. Multiple mildly displaced left lateral rib fractures, likely acute. IMPRESSION: 1. Mildly displaced left lateral rib fractures.  No pneumothorax. 2. No acute cardiopulmonary process. Electronically Signed   By: Anner Crete M.D.   On: 09/15/2019 19:43    ASSESSMENT & PLAN Kevin Perez 74 y.o. male with medical history significant for newly diagnosed IgG lambda MM who presents for a follow up visit.  He is here today for Cycle 3 Day 8 of Velcade/Dex treatment.  Today Kevin Perez.  He does have some bladder voiding issues for which will be seeing urology later today.  He is agreeable to adding Revlimid 10 mg p.o. daily to his current regimen.   Treatment plan consists of bortezomib 1.8m/m2 on Day 1, 8, and 15, dexamethasone 431mPO Day 1, 8, and 15, and Lenalidomide 2583mO day 1-14. Cycles are to be repeated every 21 days and can be continued x 8 cycles. Given his current kidney function will start with Vd and then  plan to add revlimid when patient is ready. Once on VRd this can be reduced to maintenance Vd or Rd if/after patient has good response. He will also be receiving monthly zometa therapy for lytic bone lesions.   *patient has history of suicide attempt on prednisone. This was discussed with patient and his son who were unsure what role this medication played in his suicide attempt. This regimen contains dexamethasone, which is essential for his treatment. We will attempt full dose at 82m35mut if psychiatric issues arise we can decrease this.   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 Labs adequate to proceed with treatment.  -- Given his kidney function administered Cycle 1 of treatment without revlimid. Kidney function has improved and they are now agreeable to proceeding with revlimid.  --recommend starting 14 days of revlimid today (he will receive day 8-21 of treatment, with break week being Day 1-7 of the cycles).  --details of VRd regimen above (Blood (2010) 116 (23): [id number]--additionally continue monthly zometa (first dose on 07/26/2019)  (2 years of duration) --RTC every 2 weeks for continued monitoring   #Lytic Bone Lesions #Compression fracture #Hypercalcemia, resolved --patient has received dental clearance for Zometa therapy --zometa administered in clinic on 07/26/2019 at 4g due to hypercalcemia of malignancy. Dose administered today (repeat q 4 weeks, next due Oct 2021)  --continue to monitor   #  Supportive Care --supportive therapy with acyclovir 455m BID and ASA 827mPO daily.  --Kevin Perez medication with zofran 27m105mO daily  --zometa as above  #Back Pain, improving #Shoulder Pain, improved --due to persistent shoulder pain will order bilateral plain films of the shoulders and T spine imaging.  --back pain is currently under control --[facility] recommended TLSO brace for comfort. No procedural intervention recommended  at this time.  -- 10 mg oxycodone every 6 hours with alternating 1 g Tylenol every 8 hours for pain control.  He has only required tylenol since our last visit.   #Dysphagia, improving.  --evaluated by GI, thought to be esophageal dysmotility based on radiographic esophageal studies --recommend peppermint to help with spasms.  --appreciate the assistance of GI.  --continue to monitor   No orders of the defined types were placed in this encounter.   All questions were answered. The patient knows to call the clinic with any problems, questions or concerns.  A total of more than 30 minutes were spent on this encounter and over half of that time was spent on counseling and coordination of care as outlined above.   JohLedell PeoplesD Department of Hematology/Oncology [place] [facility] Hospital[facility]: [phone]: [phone]: [email or web address]  09/29/2019 12:28 PM   Bringhen S, Chauncey CruelarDewayne HatchiaRonksenCounceatriarca F, NozLakeportevMerryvilleuglielmelli T, BenOak Groveal[place] Rizzo V, Cangialosi C, Musto P, De Cortlandib[place], GraSand Springsalcone AP, EvaBuren Kosavo M, Gaidano G, Boccadoro M, Palumbo A. Efficacy and safety of once-weekly bortezomib in multiple myeloma patients. Blood. 2010 Dec 2;116(23):4745-53.  Erratum in: Blood. 2012 Dec 20;120(26):5250.  --Within the limitations of this nonrandomized post-hoc analysis, once-weekly bortezomib seems to be equally effective and better tolerated than the standard twice-weekly schedule. The outcomes and response rate did not appear to be affected by the bortezomib dosing schedule. Durability of response was similar between groups: median PFS was 33.1 months in the once-weekly group and 31.7 months in the twice-weekly group. Overall response rates were 85% and 86%, including CR rates of 30% and 35%, in the once-weekly and twice-weekly groups, respectively.

## 2019-09-27 ENCOUNTER — Inpatient Hospital Stay: Payer: Medicare Other

## 2019-09-27 ENCOUNTER — Other Ambulatory Visit: Payer: Self-pay

## 2019-09-27 ENCOUNTER — Inpatient Hospital Stay (HOSPITAL_BASED_OUTPATIENT_CLINIC_OR_DEPARTMENT_OTHER): Payer: Medicare Other | Admitting: Hematology and Oncology

## 2019-09-27 ENCOUNTER — Inpatient Hospital Stay: Payer: Medicare Other | Attending: Hematology and Oncology

## 2019-09-27 VITALS — BP 156/83 | HR 81 | Temp 96.6°F | Resp 20 | Ht 66.0 in | Wt 123.7 lb

## 2019-09-27 DIAGNOSIS — N401 Enlarged prostate with lower urinary tract symptoms: Secondary | ICD-10-CM | POA: Diagnosis not present

## 2019-09-27 DIAGNOSIS — R338 Other retention of urine: Secondary | ICD-10-CM | POA: Diagnosis not present

## 2019-09-27 DIAGNOSIS — C9000 Multiple myeloma not having achieved remission: Secondary | ICD-10-CM

## 2019-09-27 DIAGNOSIS — Z5111 Encounter for antineoplastic chemotherapy: Secondary | ICD-10-CM | POA: Diagnosis not present

## 2019-09-27 DIAGNOSIS — Z5112 Encounter for antineoplastic immunotherapy: Secondary | ICD-10-CM | POA: Insufficient documentation

## 2019-09-27 DIAGNOSIS — R350 Frequency of micturition: Secondary | ICD-10-CM | POA: Diagnosis not present

## 2019-09-27 LAB — CBC WITH DIFFERENTIAL (CANCER CENTER ONLY)
Abs Immature Granulocytes: 0.04 10*3/uL (ref 0.00–0.07)
Basophils Absolute: 0 10*3/uL (ref 0.0–0.1)
Basophils Relative: 0 %
Eosinophils Absolute: 0.1 10*3/uL (ref 0.0–0.5)
Eosinophils Relative: 1 %
HCT: 41.8 % (ref 39.0–52.0)
Hemoglobin: 13.2 g/dL (ref 13.0–17.0)
Immature Granulocytes: 1 %
Lymphocytes Relative: 16 %
Lymphs Abs: 1.3 10*3/uL (ref 0.7–4.0)
MCH: 29.4 pg (ref 26.0–34.0)
MCHC: 31.6 g/dL (ref 30.0–36.0)
MCV: 93.1 fL (ref 80.0–100.0)
Monocytes Absolute: 0.7 10*3/uL (ref 0.1–1.0)
Monocytes Relative: 8 %
Neutro Abs: 6.2 10*3/uL (ref 1.7–7.7)
Neutrophils Relative %: 74 %
Platelet Count: 244 10*3/uL (ref 150–400)
RBC: 4.49 MIL/uL (ref 4.22–5.81)
RDW: 16.1 % — ABNORMAL HIGH (ref 11.5–15.5)
WBC Count: 8.2 10*3/uL (ref 4.0–10.5)
nRBC: 0 % (ref 0.0–0.2)

## 2019-09-27 LAB — CMP (CANCER CENTER ONLY)
ALT: 12 U/L (ref 0–44)
AST: 17 U/L (ref 15–41)
Albumin: 3.7 g/dL (ref 3.5–5.0)
Alkaline Phosphatase: 180 U/L — ABNORMAL HIGH (ref 38–126)
Anion gap: 9 (ref 5–15)
BUN: 28 mg/dL — ABNORMAL HIGH (ref 8–23)
CO2: 27 mmol/L (ref 22–32)
Calcium: 10 mg/dL (ref 8.9–10.3)
Chloride: 103 mmol/L (ref 98–111)
Creatinine: 1.04 mg/dL (ref 0.61–1.24)
GFR, Est AFR Am: >60 mL/min (ref >60)
GFR, Estimated: >60 mL/min (ref >60)
Glucose, Bld: 100 mg/dL — ABNORMAL HIGH (ref 70–99)
Potassium: 4.8 mmol/L (ref 3.5–5.1)
Sodium: 139 mmol/L (ref 135–145)
Total Bilirubin: 0.4 mg/dL (ref 0.3–1.2)
Total Protein: 8.7 g/dL — ABNORMAL HIGH (ref 6.5–8.1)

## 2019-09-27 MED ORDER — ZOLEDRONIC ACID 4 MG/5ML IV CONC
3.0000 mg | Freq: Once | INTRAVENOUS | Status: AC
  Administered 2019-09-27: 3 mg via INTRAVENOUS
  Filled 2019-09-27: qty 3.75

## 2019-09-27 MED ORDER — BORTEZOMIB CHEMO SQ INJECTION 3.5 MG (2.5MG/ML)
1.3000 mg/m2 | Freq: Once | INTRAMUSCULAR | Status: AC
  Administered 2019-09-27: 2 mg via SUBCUTANEOUS
  Filled 2019-09-27: qty 0.8

## 2019-09-27 MED ORDER — DEXAMETHASONE 4 MG PO TABS
ORAL_TABLET | ORAL | Status: AC
  Filled 2019-09-27: qty 10

## 2019-09-27 MED ORDER — PROCHLORPERAZINE MALEATE 10 MG PO TABS
ORAL_TABLET | ORAL | Status: AC
  Filled 2019-09-27: qty 1

## 2019-09-27 MED ORDER — SODIUM CHLORIDE 0.9 % IV SOLN
Freq: Once | INTRAVENOUS | Status: AC
  Filled 2019-09-27: qty 250

## 2019-09-27 MED ORDER — PROCHLORPERAZINE MALEATE 10 MG PO TABS
10.0000 mg | ORAL_TABLET | Freq: Once | ORAL | Status: AC
  Administered 2019-09-27: 10 mg via ORAL

## 2019-09-27 MED ORDER — DEXAMETHASONE 4 MG PO TABS
40.0000 mg | ORAL_TABLET | Freq: Once | ORAL | Status: AC
  Administered 2019-09-27: 40 mg via ORAL

## 2019-09-27 NOTE — Patient Instructions (Signed)
[facility] Discharge Instructions for Patients Receiving Chemotherapy  Today you received the following chemotherapy agents: Velcade  To help prevent nausea and vomiting after your treatment, we encourage you to take your nausea medication  as prescribed.    If you develop nausea and vomiting that is not controlled by your nausea medication, call the clinic.   BELOW ARE SYMPTOMS THAT SHOULD BE REPORTED IMMEDIATELY:  *FEVER GREATER THAN 100.5 F  *CHILLS WITH OR WITHOUT FEVER  NAUSEA AND VOMITING THAT IS NOT CONTROLLED WITH YOUR NAUSEA MEDICATION  *UNUSUAL SHORTNESS OF BREATH  *UNUSUAL BRUISING OR BLEEDING  TENDERNESS IN MOUTH AND THROAT WITH OR WITHOUT PRESENCE OF ULCERS  *URINARY PROBLEMS  *BOWEL PROBLEMS  UNUSUAL RASH Items with * indicate a potential emergency and should be followed up as soon as possible.  Feel free to call the clinic should you have any questions or concerns. The clinic phone number is [phone].  Please show the CHEMO ALERT CARD at check-in to the Emergency Department and triage nurse.  Zoledronic Acid injection (Hypercalcemia, Oncology) What is this medicine? ZOLEDRONIC ACID (ZOE le dron ik AS id) lowers the amount of calcium loss from bone. It is used to treat too much calcium in your blood from cancer. It is also used to prevent complications of cancer that has spread to the bone. This medicine may be used for other purposes; ask your health care provider or pharmacist if you have questions. COMMON BRAND NAME(S): Zometa What should I tell my health care provider before I take this medicine? They need to know if you have any of these conditions:  aspirin-sensitive asthma  cancer, especially if you are receiving medicines used to treat cancer  dental disease or wear dentures  infection  kidney disease  receiving corticosteroids like dexamethasone or prednisone  an unusual or allergic reaction to zoledronic acid, other  medicines, foods, dyes, or preservatives  pregnant or trying to get pregnant  breast-feeding How should I use this medicine? This medicine is for infusion into a vein. It is given by a health care professional in a hospital or clinic setting. Talk to your pediatrician regarding the use of this medicine in children. Special care may be needed. Overdosage: If you think you have taken too much of this medicine contact a poison control center or emergency room at once. NOTE: This medicine is only for you. Do not share this medicine with others. What if I miss a dose? It is important not to miss your dose. Call your doctor or health care professional if you are unable to keep an appointment. What may interact with this medicine?  certain antibiotics given by injection  NSAIDs, medicines for pain and inflammation, like ibuprofen or naproxen  some diuretics like bumetanide, furosemide  teriparatide  thalidomide This list may not describe all possible interactions. Give your health care provider a list of all the medicines, herbs, non-prescription drugs, or dietary supplements you use. Also tell them if you smoke, drink alcohol, or use illegal drugs. Some items may interact with your medicine. What should I watch for while using this medicine? Visit your doctor or health care professional for regular checkups. It may be some time before you see the benefit from this medicine. Do not stop taking your medicine unless your doctor tells you to. Your doctor may order blood tests or other tests to see how you are doing. Women should inform their doctor if they wish to become pregnant or think they might   There is a potential for serious side effects to an unborn child. Talk to your health care professional or pharmacist for more information. You should make sure that you get enough calcium and vitamin D while you are taking this medicine. Discuss the foods you eat and the vitamins you take  with your health care professional. Some people who take this medicine have severe bone, joint, and/or muscle pain. This medicine may also increase your risk for jaw problems or a broken thigh bone. Tell your doctor right away if you have severe pain in your jaw, bones, joints, or muscles. Tell your doctor if you have any pain that does not go away or that gets worse. Tell your dentist and dental surgeon that you are taking this medicine. You should not have major dental surgery while on this medicine. See your dentist to have a dental exam and fix any dental problems before starting this medicine. Take good care of your teeth while on this medicine. Make sure you see your dentist for regular follow-up appointments. What side effects may I notice from receiving this medicine? Side effects that you should report to your doctor or health care professional as soon as possible:  allergic reactions like skin rash, itching or hives, swelling of the face, lips, or tongue  anxiety, confusion, or depression  breathing problems  changes in vision  eye pain  feeling faint or lightheaded, falls  jaw pain, especially after dental work  mouth sores  muscle cramps, stiffness, or weakness  redness, blistering, peeling or loosening of the skin, including inside the mouth  trouble passing urine or change in the amount of urine Side effects that usually do not require medical attention (report to your doctor or health care professional if they continue or are bothersome):  bone, joint, or muscle pain  constipation  diarrhea  fever  hair loss  irritation at site where injected  loss of appetite  nausea, vomiting  stomach upset  trouble sleeping  trouble swallowing  weak or tired This list may not describe all possible side effects. Call your doctor for medical advice about side effects. You may report side effects to FDA at 1-800-FDA-1088. Where should I keep my medicine? This drug  is given in a hospital or clinic and will not be stored at home. NOTE: This sheet is a summary. It may not cover all possible information. If you have questions about this medicine, talk to your doctor, pharmacist, or health care provider.  2020 Elsevier/Gold Standard (2013-06-02 14:19:39)  

## 2019-09-28 ENCOUNTER — Telehealth: Payer: Self-pay | Admitting: Family Medicine

## 2019-09-28 DIAGNOSIS — I130 Hypertensive heart and chronic kidney disease with heart failure and stage 1 through stage 4 chronic kidney disease, or unspecified chronic kidney disease: Secondary | ICD-10-CM | POA: Diagnosis not present

## 2019-09-28 DIAGNOSIS — N183 Chronic kidney disease, stage 3 unspecified: Secondary | ICD-10-CM | POA: Diagnosis not present

## 2019-09-28 DIAGNOSIS — I677 Cerebral arteritis, not elsewhere classified: Secondary | ICD-10-CM | POA: Diagnosis not present

## 2019-09-28 DIAGNOSIS — C9000 Multiple myeloma not having achieved remission: Secondary | ICD-10-CM | POA: Diagnosis not present

## 2019-09-28 DIAGNOSIS — I7300 Raynaud's syndrome without gangrene: Secondary | ICD-10-CM | POA: Diagnosis not present

## 2019-09-28 DIAGNOSIS — I5032 Chronic diastolic (congestive) heart failure: Secondary | ICD-10-CM | POA: Diagnosis not present

## 2019-09-28 NOTE — Telephone Encounter (Signed)
Kevin Perez is calling in from [place] asking for verbal orders to continue care as he had a catheter placed yesterday.

## 2019-09-29 ENCOUNTER — Encounter: Payer: Self-pay | Admitting: Hematology and Oncology

## 2019-10-01 ENCOUNTER — Other Ambulatory Visit: Payer: Self-pay | Admitting: Family Medicine

## 2019-10-01 NOTE — Telephone Encounter (Signed)
Called and provided VO to Barry.

## 2019-10-01 NOTE — Telephone Encounter (Signed)
[facility] is calling in asking for an update as they are due to go see patient soon.

## 2019-10-03 ENCOUNTER — Other Ambulatory Visit: Payer: Medicare Other

## 2019-10-03 ENCOUNTER — Ambulatory Visit: Payer: Medicare Other

## 2019-10-03 ENCOUNTER — Inpatient Hospital Stay: Payer: Medicare Other

## 2019-10-03 ENCOUNTER — Other Ambulatory Visit: Payer: Self-pay

## 2019-10-03 ENCOUNTER — Inpatient Hospital Stay: Payer: Medicare Other | Admitting: Nutrition

## 2019-10-03 ENCOUNTER — Other Ambulatory Visit: Payer: Self-pay | Admitting: Hematology and Oncology

## 2019-10-03 VITALS — BP 144/73 | HR 75 | Temp 98.2°F | Resp 20 | Ht 66.0 in | Wt 123.2 lb

## 2019-10-03 DIAGNOSIS — K5900 Constipation, unspecified: Secondary | ICD-10-CM | POA: Diagnosis not present

## 2019-10-03 DIAGNOSIS — Z87891 Personal history of nicotine dependence: Secondary | ICD-10-CM | POA: Diagnosis not present

## 2019-10-03 DIAGNOSIS — C9000 Multiple myeloma not having achieved remission: Secondary | ICD-10-CM

## 2019-10-03 DIAGNOSIS — N400 Enlarged prostate without lower urinary tract symptoms: Secondary | ICD-10-CM | POA: Diagnosis not present

## 2019-10-03 DIAGNOSIS — L89311 Pressure ulcer of right buttock, stage 1: Secondary | ICD-10-CM | POA: Diagnosis not present

## 2019-10-03 DIAGNOSIS — Z5112 Encounter for antineoplastic immunotherapy: Secondary | ICD-10-CM | POA: Diagnosis not present

## 2019-10-03 DIAGNOSIS — F419 Anxiety disorder, unspecified: Secondary | ICD-10-CM | POA: Diagnosis not present

## 2019-10-03 DIAGNOSIS — I7300 Raynaud's syndrome without gangrene: Secondary | ICD-10-CM | POA: Diagnosis not present

## 2019-10-03 DIAGNOSIS — H269 Unspecified cataract: Secondary | ICD-10-CM | POA: Diagnosis not present

## 2019-10-03 DIAGNOSIS — Z8673 Personal history of transient ischemic attack (TIA), and cerebral infarction without residual deficits: Secondary | ICD-10-CM | POA: Diagnosis not present

## 2019-10-03 DIAGNOSIS — M109 Gout, unspecified: Secondary | ICD-10-CM | POA: Diagnosis not present

## 2019-10-03 DIAGNOSIS — Z981 Arthrodesis status: Secondary | ICD-10-CM | POA: Diagnosis not present

## 2019-10-03 DIAGNOSIS — M545 Low back pain: Secondary | ICD-10-CM | POA: Diagnosis not present

## 2019-10-03 DIAGNOSIS — R1310 Dysphagia, unspecified: Secondary | ICD-10-CM | POA: Diagnosis not present

## 2019-10-03 DIAGNOSIS — I130 Hypertensive heart and chronic kidney disease with heart failure and stage 1 through stage 4 chronic kidney disease, or unspecified chronic kidney disease: Secondary | ICD-10-CM | POA: Diagnosis not present

## 2019-10-03 DIAGNOSIS — I5032 Chronic diastolic (congestive) heart failure: Secondary | ICD-10-CM | POA: Diagnosis not present

## 2019-10-03 DIAGNOSIS — N183 Chronic kidney disease, stage 3 unspecified: Secondary | ICD-10-CM | POA: Diagnosis not present

## 2019-10-03 DIAGNOSIS — Z915 Personal history of self-harm: Secondary | ICD-10-CM | POA: Diagnosis not present

## 2019-10-03 DIAGNOSIS — K219 Gastro-esophageal reflux disease without esophagitis: Secondary | ICD-10-CM | POA: Diagnosis not present

## 2019-10-03 DIAGNOSIS — I677 Cerebral arteritis, not elsewhere classified: Secondary | ICD-10-CM | POA: Diagnosis not present

## 2019-10-03 DIAGNOSIS — Z7982 Long term (current) use of aspirin: Secondary | ICD-10-CM | POA: Diagnosis not present

## 2019-10-03 DIAGNOSIS — Z7902 Long term (current) use of antithrombotics/antiplatelets: Secondary | ICD-10-CM | POA: Diagnosis not present

## 2019-10-03 DIAGNOSIS — Z87311 Personal history of (healed) other pathological fracture: Secondary | ICD-10-CM | POA: Diagnosis not present

## 2019-10-03 DIAGNOSIS — E785 Hyperlipidemia, unspecified: Secondary | ICD-10-CM | POA: Diagnosis not present

## 2019-10-03 LAB — CMP (CANCER CENTER ONLY)
ALT: 11 U/L (ref 0–44)
AST: 16 U/L (ref 15–41)
Albumin: 3.5 g/dL (ref 3.5–5.0)
Alkaline Phosphatase: 173 U/L — ABNORMAL HIGH (ref 38–126)
Anion gap: 8 (ref 5–15)
BUN: 30 mg/dL — ABNORMAL HIGH (ref 8–23)
CO2: 25 mmol/L (ref 22–32)
Calcium: 9.3 mg/dL (ref 8.9–10.3)
Chloride: 104 mmol/L (ref 98–111)
Creatinine: 1.08 mg/dL (ref 0.61–1.24)
GFR, Est AFR Am: >60 mL/min (ref >60)
GFR, Estimated: >60 mL/min (ref >60)
Glucose, Bld: 108 mg/dL — ABNORMAL HIGH (ref 70–99)
Potassium: 4.7 mmol/L (ref 3.5–5.1)
Sodium: 137 mmol/L (ref 135–145)
Total Bilirubin: 0.5 mg/dL (ref 0.3–1.2)
Total Protein: 8.2 g/dL — ABNORMAL HIGH (ref 6.5–8.1)

## 2019-10-03 LAB — CBC WITH DIFFERENTIAL (CANCER CENTER ONLY)
Abs Immature Granulocytes: 0.09 10*3/uL — ABNORMAL HIGH (ref 0.00–0.07)
Basophils Absolute: 0 10*3/uL (ref 0.0–0.1)
Basophils Relative: 0 %
Eosinophils Absolute: 0.1 10*3/uL (ref 0.0–0.5)
Eosinophils Relative: 1 %
HCT: 42.2 % (ref 39.0–52.0)
Hemoglobin: 13.4 g/dL (ref 13.0–17.0)
Immature Granulocytes: 1 %
Lymphocytes Relative: 13 %
Lymphs Abs: 1.2 10*3/uL (ref 0.7–4.0)
MCH: 29.2 pg (ref 26.0–34.0)
MCHC: 31.8 g/dL (ref 30.0–36.0)
MCV: 91.9 fL (ref 80.0–100.0)
Monocytes Absolute: 0.8 10*3/uL (ref 0.1–1.0)
Monocytes Relative: 8 %
Neutro Abs: 7.5 10*3/uL (ref 1.7–7.7)
Neutrophils Relative %: 77 %
Platelet Count: 172 10*3/uL (ref 150–400)
RBC: 4.59 MIL/uL (ref 4.22–5.81)
RDW: 16.2 % — ABNORMAL HIGH (ref 11.5–15.5)
WBC Count: 9.7 10*3/uL (ref 4.0–10.5)
nRBC: 0 % (ref 0.0–0.2)

## 2019-10-03 MED ORDER — PROCHLORPERAZINE MALEATE 10 MG PO TABS
10.0000 mg | ORAL_TABLET | Freq: Once | ORAL | Status: AC
  Administered 2019-10-03: 10 mg via ORAL

## 2019-10-03 MED ORDER — BORTEZOMIB CHEMO SQ INJECTION 3.5 MG (2.5MG/ML)
1.3000 mg/m2 | Freq: Once | INTRAMUSCULAR | Status: AC
  Administered 2019-10-03: 2 mg via SUBCUTANEOUS
  Filled 2019-10-03: qty 0.8

## 2019-10-03 MED ORDER — DEXAMETHASONE 4 MG PO TABS
ORAL_TABLET | ORAL | Status: AC
  Filled 2019-10-03: qty 10

## 2019-10-03 MED ORDER — PROCHLORPERAZINE MALEATE 10 MG PO TABS
ORAL_TABLET | ORAL | Status: AC
  Filled 2019-10-03: qty 1

## 2019-10-03 MED ORDER — DEXAMETHASONE 4 MG PO TABS
40.0000 mg | ORAL_TABLET | Freq: Once | ORAL | Status: AC
  Administered 2019-10-03: 40 mg via ORAL

## 2019-10-03 NOTE — Progress Notes (Signed)
Nutrition follow-up completed with patient during infusion for multiple myeloma. Weight was documented as 123.25 pounds on September 15.  Is improved from 118 pounds on August 16. Labs were reviewed. Patient denies nausea, vomiting, constipation and diarrhea. He states he is eating okay. He does not verbalize any questions or concerns.  Nutrition diagnosis: Unintentional weight loss improved.  Intervention: Patient encouraged to continue frequent meals and snacks with high-calorie, high-protein foods. Recommended 2-3 bottles of Ensure Plus or Ensure Enlive daily. Continue soft foods.  Monitoring, evaluation, goals: Patient will tolerate increased calories and protein to minimize weight loss.  Next visit: Wednesday, October 20 during infusion.  **Disclaimer: This note was dictated with voice recognition software. Similar sounding words can inadvertently be transcribed and this note may contain transcription errors which may not have been corrected upon publication of note.**

## 2019-10-03 NOTE — Patient Instructions (Signed)
[facility] Discharge Instructions for Patients Receiving Chemotherapy  Today you received the following chemotherapy agents: Velcade  To help prevent nausea and vomiting after your treatment, we encourage you to take your nausea medication  as prescribed.    If you develop nausea and vomiting that is not controlled by your nausea medication, call the clinic.   BELOW ARE SYMPTOMS THAT SHOULD BE REPORTED IMMEDIATELY:  *FEVER GREATER THAN 100.5 F  *CHILLS WITH OR WITHOUT FEVER  NAUSEA AND VOMITING THAT IS NOT CONTROLLED WITH YOUR NAUSEA MEDICATION  *UNUSUAL SHORTNESS OF BREATH  *UNUSUAL BRUISING OR BLEEDING  TENDERNESS IN MOUTH AND THROAT WITH OR WITHOUT PRESENCE OF ULCERS  *URINARY PROBLEMS  *BOWEL PROBLEMS  UNUSUAL RASH Items with * indicate a potential emergency and should be followed up as soon as possible.  Feel free to call the clinic should you have any questions or concerns. The clinic phone number is [phone].  Please show the CHEMO ALERT CARD at check-in to the Emergency Department and triage nurse.  Zoledronic Acid injection (Hypercalcemia, Oncology) What is this medicine? ZOLEDRONIC ACID (ZOE le dron ik AS id) lowers the amount of calcium loss from bone. It is used to treat too much calcium in your blood from cancer. It is also used to prevent complications of cancer that has spread to the bone. This medicine may be used for other purposes; ask your health care provider or pharmacist if you have questions. COMMON BRAND NAME(S): Zometa What should I tell my health care provider before I take this medicine? They need to know if you have any of these conditions:  aspirin-sensitive asthma  cancer, especially if you are receiving medicines used to treat cancer  dental disease or wear dentures  infection  kidney disease  receiving corticosteroids like dexamethasone or prednisone  an unusual or allergic reaction to zoledronic acid, other  medicines, foods, dyes, or preservatives  pregnant or trying to get pregnant  breast-feeding How should I use this medicine? This medicine is for infusion into a vein. It is given by a health care professional in a hospital or clinic setting. Talk to your pediatrician regarding the use of this medicine in children. Special care may be needed. Overdosage: If you think you have taken too much of this medicine contact a poison control center or emergency room at once. NOTE: This medicine is only for you. Do not share this medicine with others. What if I miss a dose? It is important not to miss your dose. Call your doctor or health care professional if you are unable to keep an appointment. What may interact with this medicine?  certain antibiotics given by injection  NSAIDs, medicines for pain and inflammation, like ibuprofen or naproxen  some diuretics like bumetanide, furosemide  teriparatide  thalidomide This list may not describe all possible interactions. Give your health care provider a list of all the medicines, herbs, non-prescription drugs, or dietary supplements you use. Also tell them if you smoke, drink alcohol, or use illegal drugs. Some items may interact with your medicine. What should I watch for while using this medicine? Visit your doctor or health care professional for regular checkups. It may be some time before you see the benefit from this medicine. Do not stop taking your medicine unless your doctor tells you to. Your doctor may order blood tests or other tests to see how you are doing. Women should inform their doctor if they wish to become pregnant or think they might   There is a potential for serious side effects to an unborn child. Talk to your health care professional or pharmacist for more information. You should make sure that you get enough calcium and vitamin D while you are taking this medicine. Discuss the foods you eat and the vitamins you take  with your health care professional. Some people who take this medicine have severe bone, joint, and/or muscle pain. This medicine may also increase your risk for jaw problems or a broken thigh bone. Tell your doctor right away if you have severe pain in your jaw, bones, joints, or muscles. Tell your doctor if you have any pain that does not go away or that gets worse. Tell your dentist and dental surgeon that you are taking this medicine. You should not have major dental surgery while on this medicine. See your dentist to have a dental exam and fix any dental problems before starting this medicine. Take good care of your teeth while on this medicine. Make sure you see your dentist for regular follow-up appointments. What side effects may I notice from receiving this medicine? Side effects that you should report to your doctor or health care professional as soon as possible:  allergic reactions like skin rash, itching or hives, swelling of the face, lips, or tongue  anxiety, confusion, or depression  breathing problems  changes in vision  eye pain  feeling faint or lightheaded, falls  jaw pain, especially after dental work  mouth sores  muscle cramps, stiffness, or weakness  redness, blistering, peeling or loosening of the skin, including inside the mouth  trouble passing urine or change in the amount of urine Side effects that usually do not require medical attention (report to your doctor or health care professional if they continue or are bothersome):  bone, joint, or muscle pain  constipation  diarrhea  fever  hair loss  irritation at site where injected  loss of appetite  nausea, vomiting  stomach upset  trouble sleeping  trouble swallowing  weak or tired This list may not describe all possible side effects. Call your doctor for medical advice about side effects. You may report side effects to FDA at 1-800-FDA-1088. Where should I keep my medicine? This drug  is given in a hospital or clinic and will not be stored at home. NOTE: This sheet is a summary. It may not cover all possible information. If you have questions about this medicine, talk to your doctor, pharmacist, or health care provider.  2020 [employer]vier/Gold Standard (2013-06-02 14:19:39)  

## 2019-10-09 ENCOUNTER — Other Ambulatory Visit: Payer: Self-pay | Admitting: Hematology and Oncology

## 2019-10-09 DIAGNOSIS — Z888 Allergy status to other drugs, medicaments and biological substances status: Secondary | ICD-10-CM | POA: Diagnosis not present

## 2019-10-09 DIAGNOSIS — Z8673 Personal history of transient ischemic attack (TIA), and cerebral infarction without residual deficits: Secondary | ICD-10-CM | POA: Diagnosis not present

## 2019-10-09 DIAGNOSIS — I469 Cardiac arrest, cause unspecified: Secondary | ICD-10-CM | POA: Diagnosis not present

## 2019-10-09 DIAGNOSIS — Z743 Need for continuous supervision: Secondary | ICD-10-CM | POA: Diagnosis not present

## 2019-10-10 ENCOUNTER — Ambulatory Visit: Payer: Medicare Other | Admitting: Hematology and Oncology

## 2019-10-10 ENCOUNTER — Other Ambulatory Visit: Payer: Medicare Other

## 2019-10-10 ENCOUNTER — Telehealth: Payer: Self-pay | Admitting: *Deleted

## 2019-10-10 ENCOUNTER — Ambulatory Visit: Payer: Medicare Other

## 2019-10-10 NOTE — Telephone Encounter (Signed)
TCT patient's son, Axxel Gude, as I had heard from scheduling that pt had dies 2022/04/27 morning. Spoke with Gerald Stabs. He states that early 04-27-2022 morning around 2am pt woke and  Called for chris.  Gerald Stabs states his father was was very diaphoretic, pale and saying he didn't feel right.  Within a few minutes chris states his father took a breath and that was it.  Chris called EMS but they were unable to resuscitate him. Gerald Stabs states he is doing ok and is very appreciative of all Dr. Lorenso Courier did for his dad.  Dr. Lorenso Courier made aware of the above.

## 2019-10-12 ENCOUNTER — Telehealth: Payer: Self-pay | Admitting: Family Medicine

## 2019-10-12 NOTE — Telephone Encounter (Signed)
Team I think he was on hospice- can you call hospice and see if we can get the date and then update the chart?

## 2019-10-12 NOTE — Telephone Encounter (Signed)
Dr. Yong Channel,   Just wanted to inform you that pts daught in law called stating the pt has passed away. I was unable to get the date that he passed.

## 2019-10-15 NOTE — Telephone Encounter (Signed)
Updated chart with date that he passed. Patient passed away on 10/29/19.

## 2019-10-17 ENCOUNTER — Other Ambulatory Visit: Payer: Medicare Other

## 2019-10-17 ENCOUNTER — Ambulatory Visit: Payer: Medicare Other

## 2019-10-19 DEATH — deceased

## 2019-10-24 ENCOUNTER — Other Ambulatory Visit: Payer: Medicare Other

## 2019-10-24 ENCOUNTER — Ambulatory Visit: Payer: Medicare Other | Admitting: Hematology and Oncology

## 2019-10-24 ENCOUNTER — Ambulatory Visit: Payer: Medicare Other

## 2019-10-31 ENCOUNTER — Ambulatory Visit: Payer: Medicare Other

## 2019-10-31 ENCOUNTER — Other Ambulatory Visit: Payer: Medicare Other

## 2019-11-05 ENCOUNTER — Inpatient Hospital Stay: Payer: Medicare Other | Admitting: Neurology

## 2019-11-06 ENCOUNTER — Inpatient Hospital Stay: Payer: Medicare Other | Admitting: Neurology

## 2019-11-07 ENCOUNTER — Ambulatory Visit: Payer: Medicare Other

## 2019-11-07 ENCOUNTER — Ambulatory Visit: Payer: Medicare Other | Admitting: Hematology and Oncology

## 2019-11-07 ENCOUNTER — Other Ambulatory Visit: Payer: Medicare Other

## 2019-11-07 ENCOUNTER — Encounter: Payer: Medicare Other | Admitting: Nutrition

## 2019-11-19 ENCOUNTER — Ambulatory Visit: Payer: Medicare Other | Admitting: Family Medicine

## 2019-11-20 ENCOUNTER — Ambulatory Visit: Payer: Medicare Other | Admitting: Family Medicine

## 2022-01-03 IMAGING — XA IR VERTEBROPLASTY LUMBOSACRAL INJ
1 series · 14 of 24 positions shown · IV contrast (IODINE)
Comparison: none

INDICATION: Severe low back pain secondary to compression fracture at L4.
TECHNIQUE: Informed written consent was obtained from the patient after a
thorough discussion of the procedural risks, benefits and
alternatives. All questions were addressed. Maximal Sterile Barrier
Technique was utilized including caps, mask, sterile gowns, sterile
gloves, sterile drape, hand hygiene and skin antiseptic. A timeout
was performed prior to the initiation of the procedure.

[Series 300: spine · 14 of 34 slices shown]
[im 1/34]
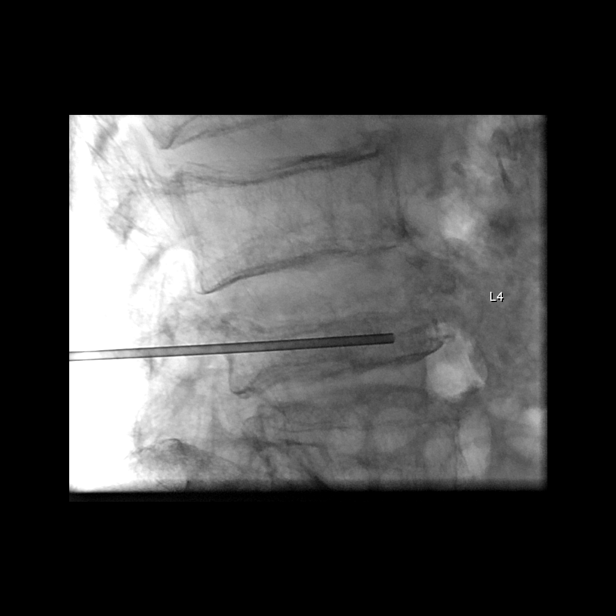
[im 3/34]
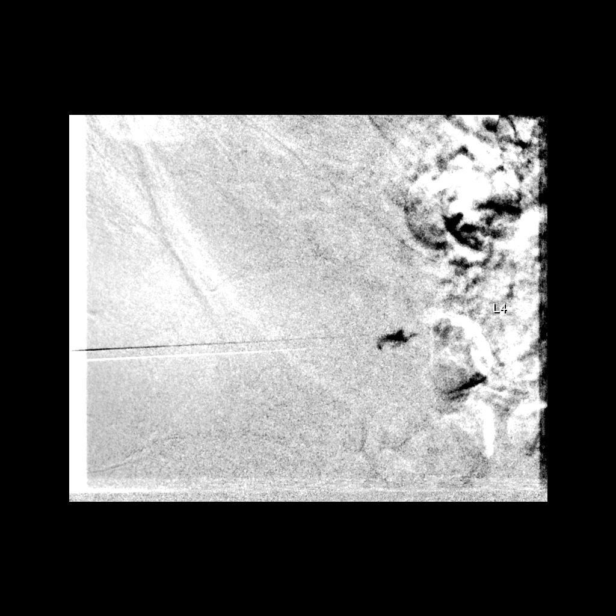
[im 6/34]
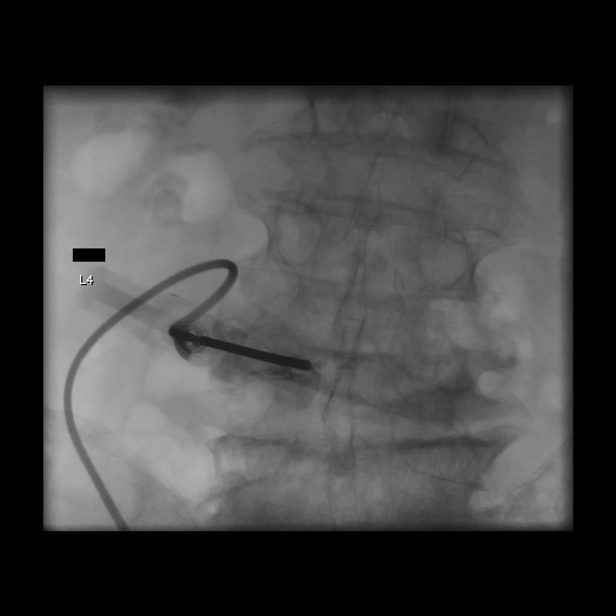
[im 9/34]
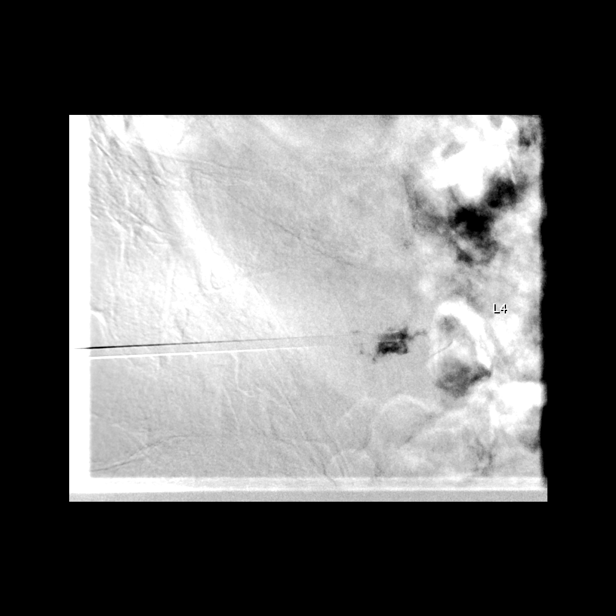
[im 11/34]
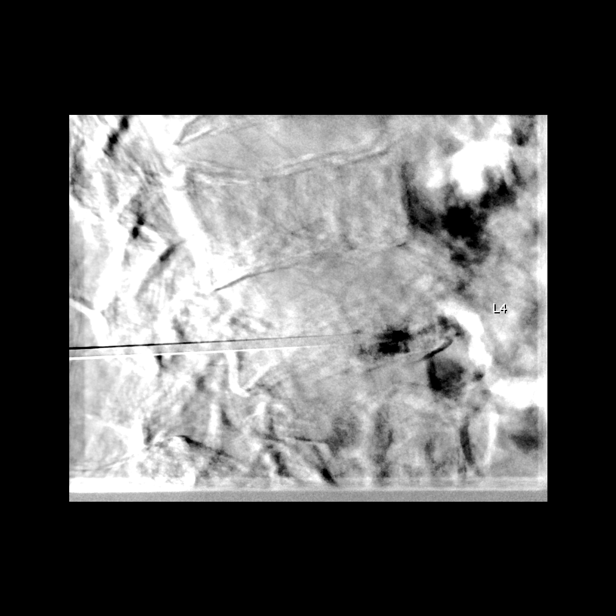
[im 13/34]
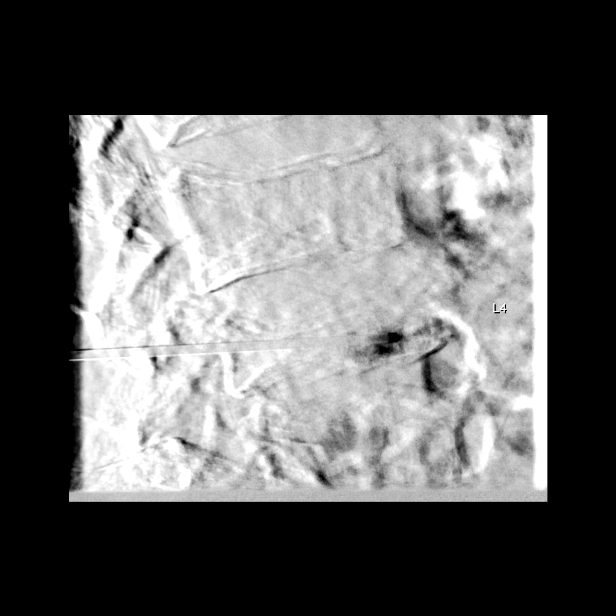
[im 16/34]
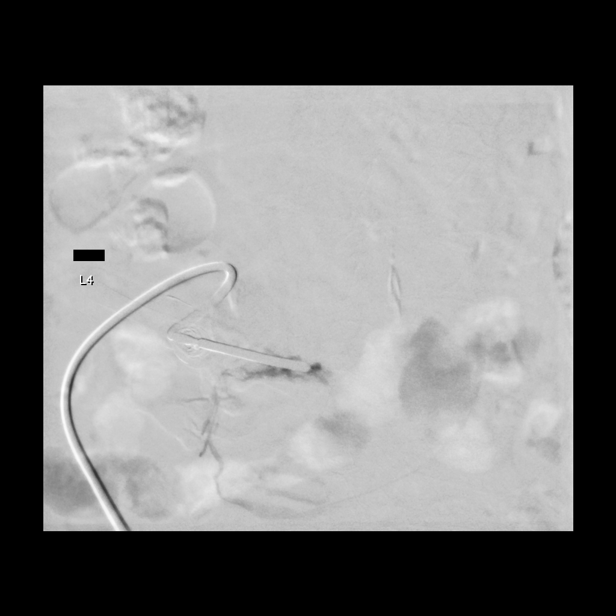
[im 18/34]
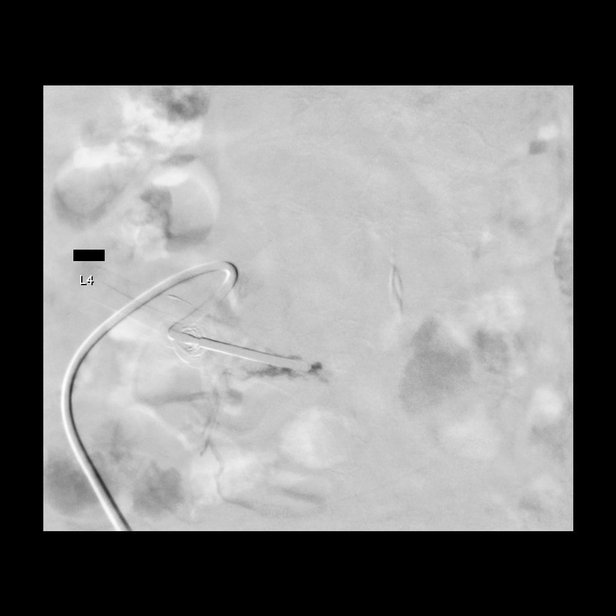
[im 21/34]
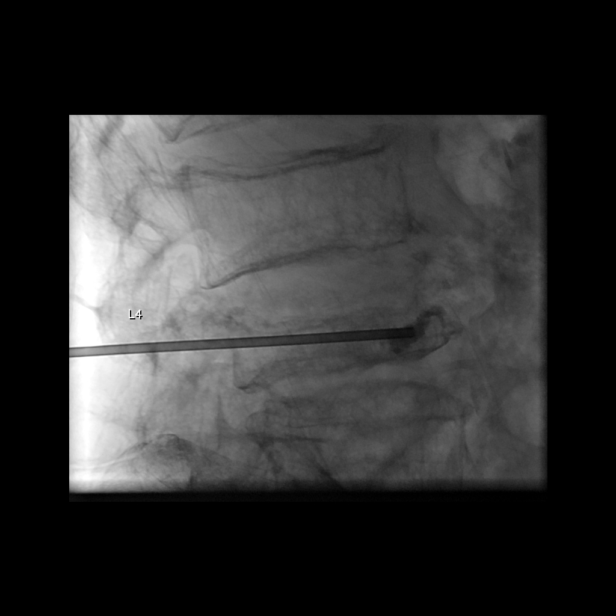
[im 23/34]
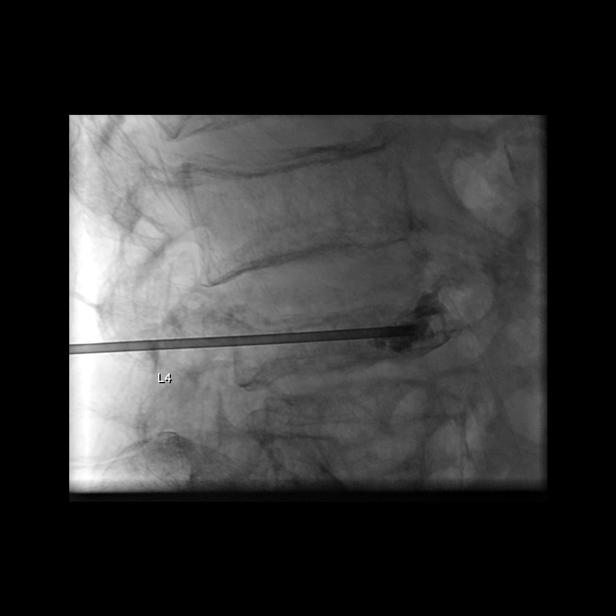
[im 26/34]
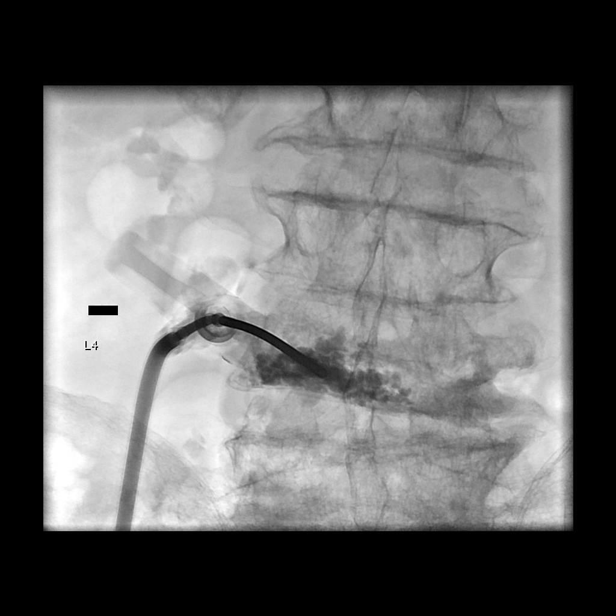
[im 28/34]
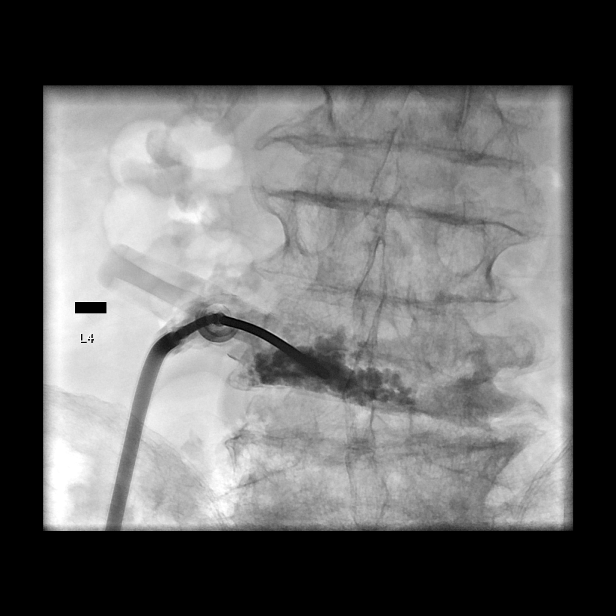
[im 31/34]
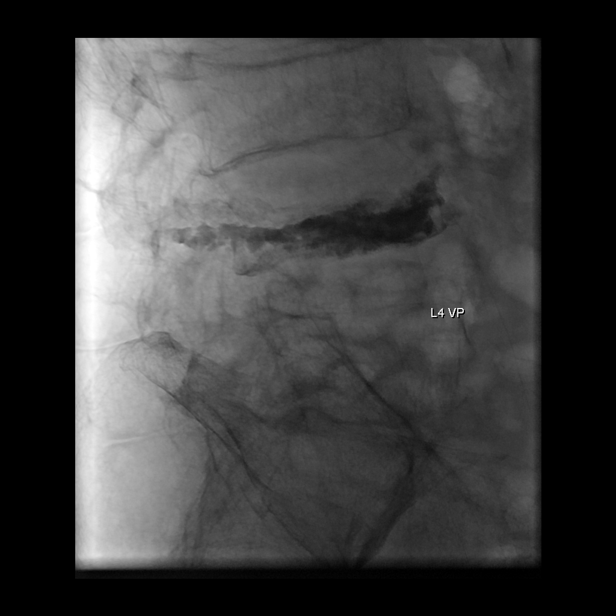
[im 34/34]
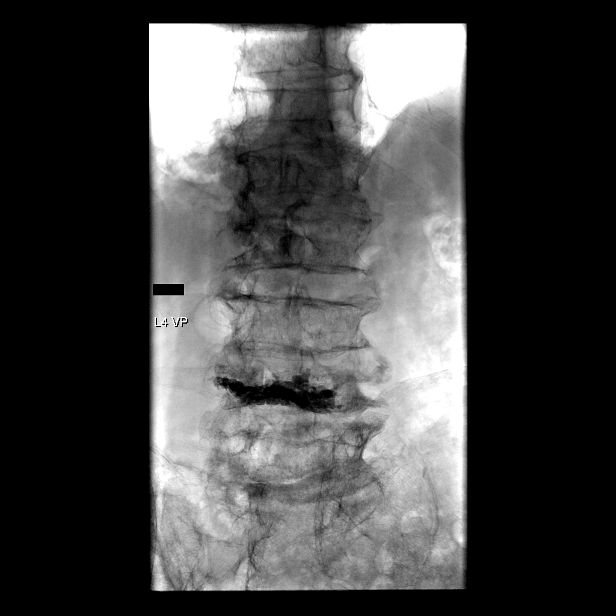

[14 of 24 positions shown; findings below may reference images not displayed]

EXAM:
VERTEBROPLASTY AT L4

MEDICATIONS:
As antibiotic prophylaxis, vancomycin 1 g IV ordered pre-procedure
and administered intravenously within 1 hour of incision.

ANESTHESIA/SEDATION:
Moderate (conscious) sedation was employed during this procedure. A
total of Versed 1 mg and Fentanyl 25 mcg was administered
intravenously.

Moderate Sedation Time: 30 minutes. The patient's level of
consciousness and vital signs were monitored continuously by
radiology nursing throughout the procedure under my direct
supervision.

FLUOROSCOPY TIME:  Fluoroscopy Time: 16 minutes 54 seconds (7332
mGy)

COMPLICATIONS:
None immediate.
PROCEDURE:
The patient was placed prone on the fluoroscopic table. Nasal oxygen
was administered. Physiologic monitoring was performed throughout
the duration of the procedure. The skin overlying the lumbar region
was prepped and draped in the usual sterile fashion. The L4
vertebral body was identified and both pedicles were infiltrated
with 0.25% Bupivacaine. This was then followed by the advancement of
a 13-gauge Cook needle through the pedicles into the anterior
one-third at L4. A gentle contrast injection demonstrated a
trabecular pattern of contrast.

At this time, methylmethacrylate mixture was reconstituted. Under
biplane intermittent fluoroscopy, the methylmethacrylate was then
injected into the L4 vertebral body with filling of the vertebral
body.

No extravasation was noted into the disk spaces or posteriorly into
the spinal canal. No epidural venous contamination was seen.

The needles were then removed. Hemostasis was achieved at the skin
entry sites.

There were no acute complications. Patient tolerated the procedure
well. The patient was observed for 3 hours and discharged in good
condition.
IMPRESSION: 1. Status post vertebral body augmentation for painful compression
fracture at L4 using vertebroplasty technique.

## 2022-02-11 IMAGING — CR DG LUMBAR SPINE COMPLETE 4+V
5 series · 5 of 5 positions shown · non-contrast
Comparison: 06/06/2019

CLINICAL DATA: Worsening pain, multiple myeloma

EXAM:
LUMBAR SPINE - COMPLETE 4+ VIEW

[t lumbar spine ap]
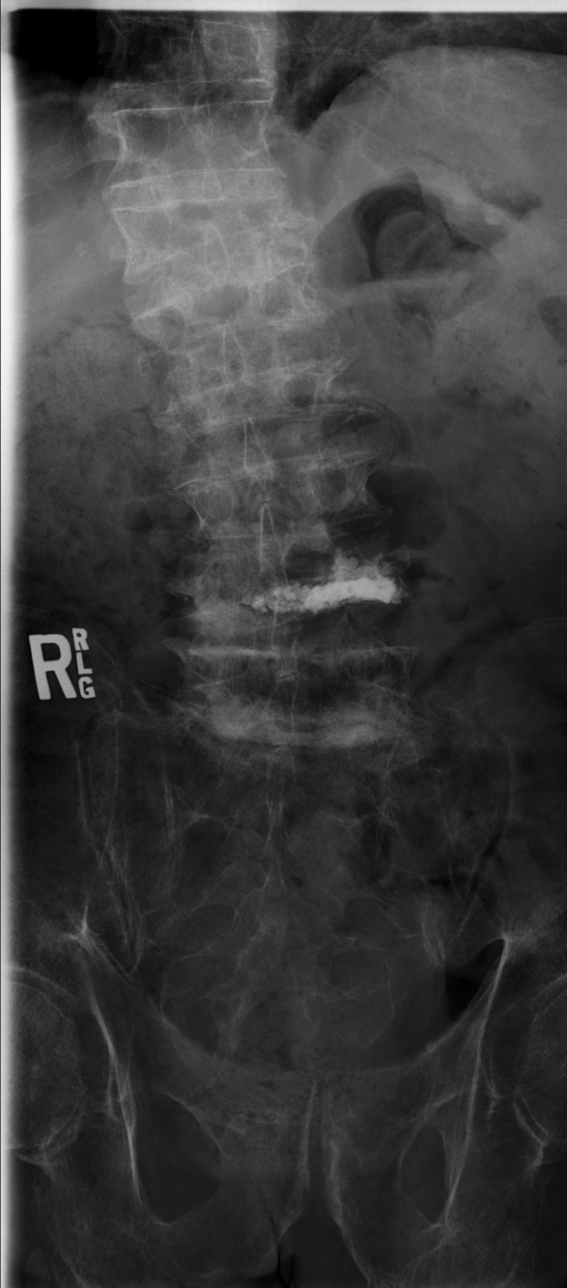

[t lumbar spine obl (1 of 2)]
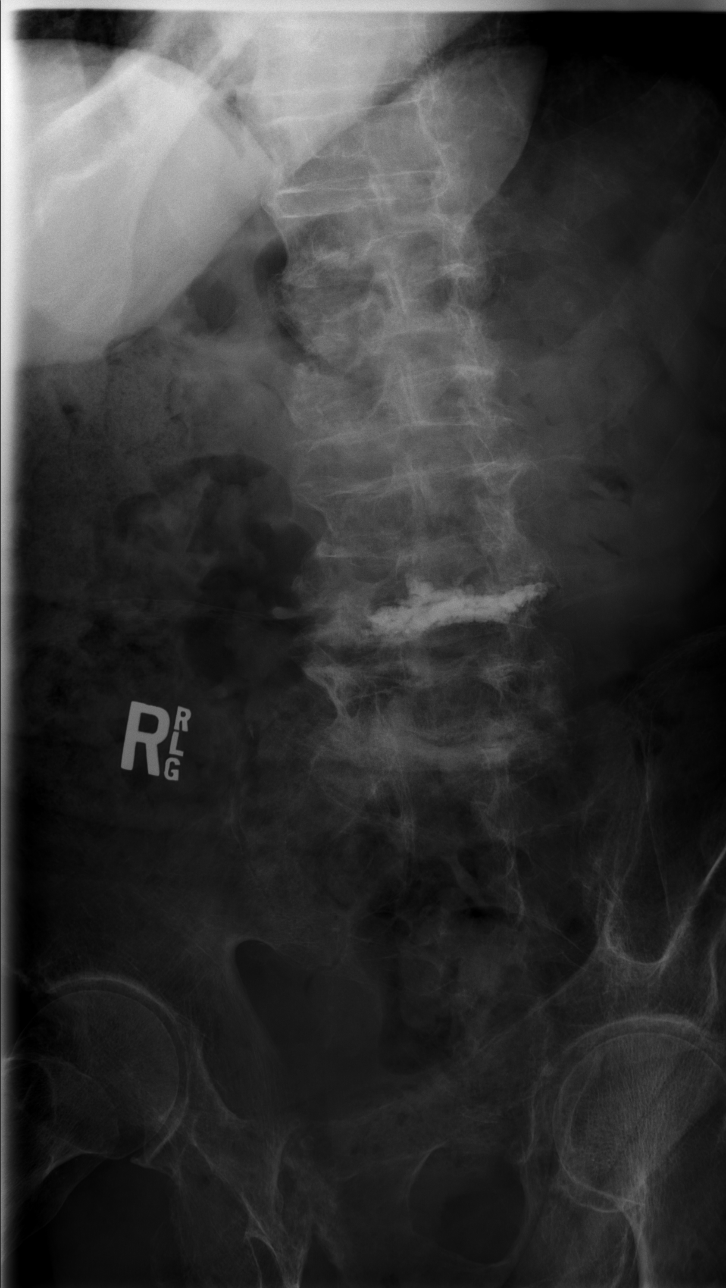

[t lumbar spine obl (2 of 2)]
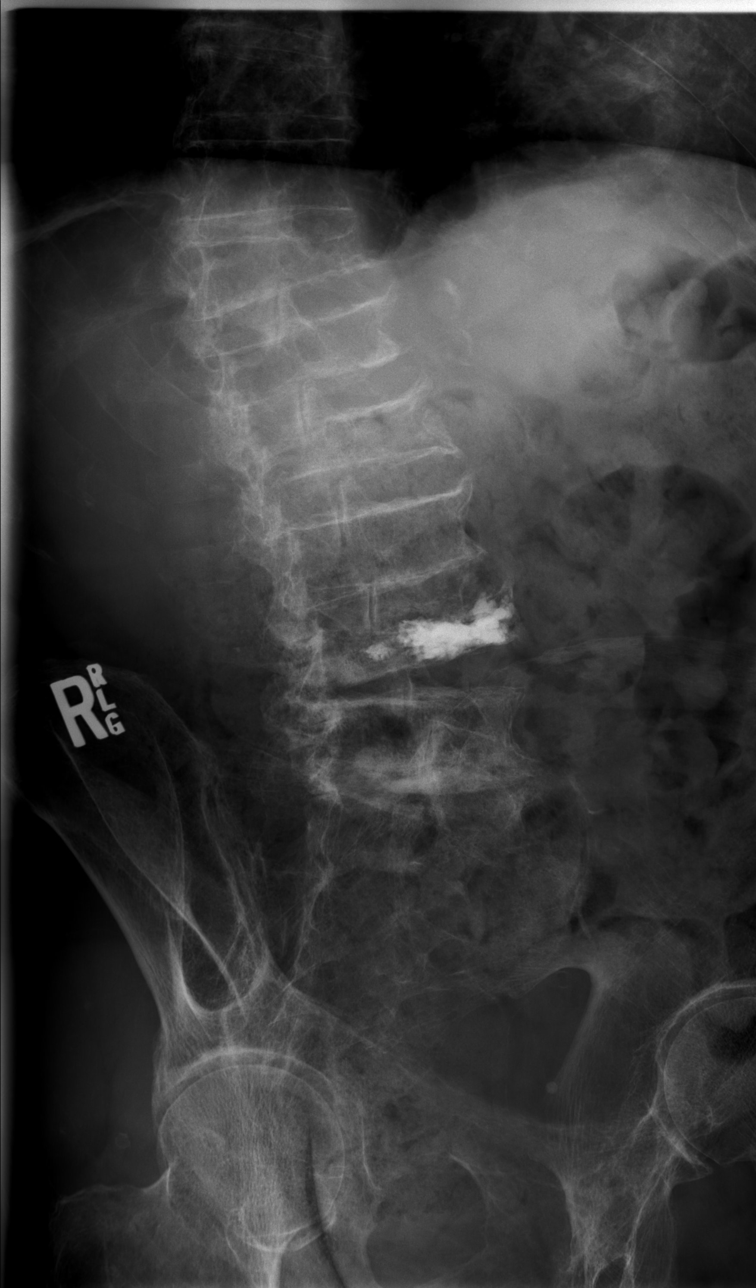

[t lumbar spine lat]
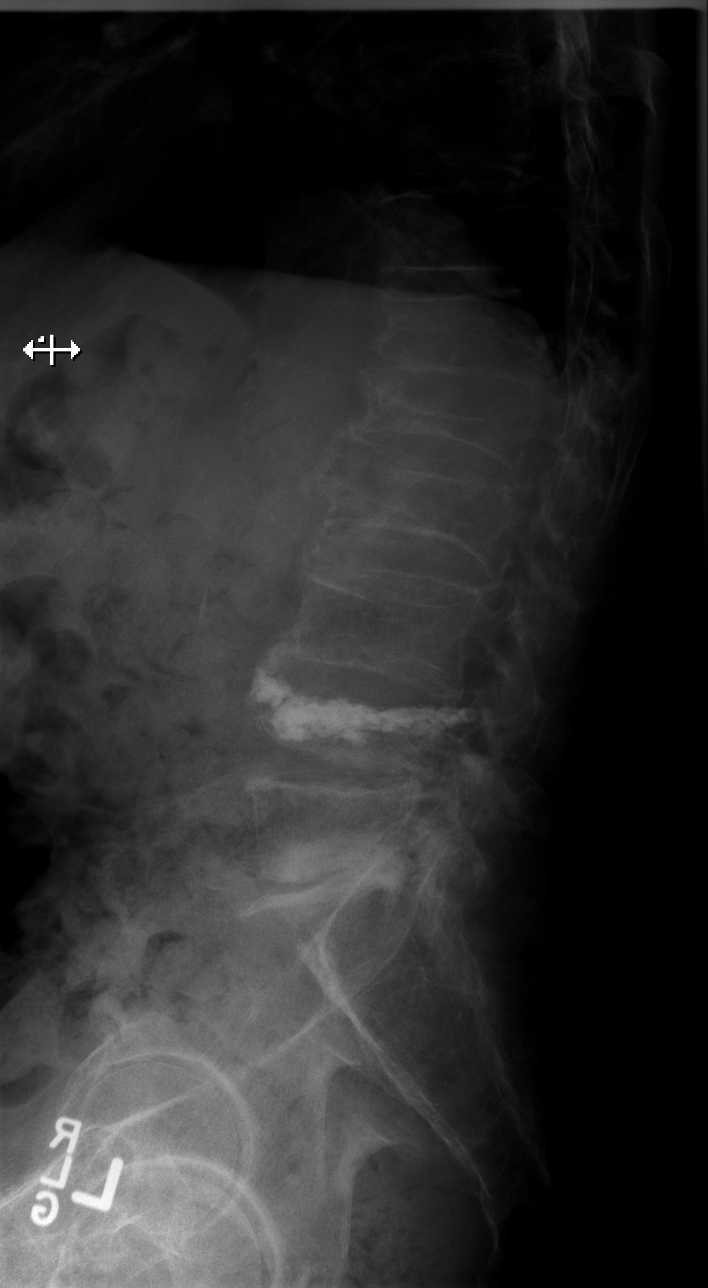

[t lumbar l-5 s-1 spot]
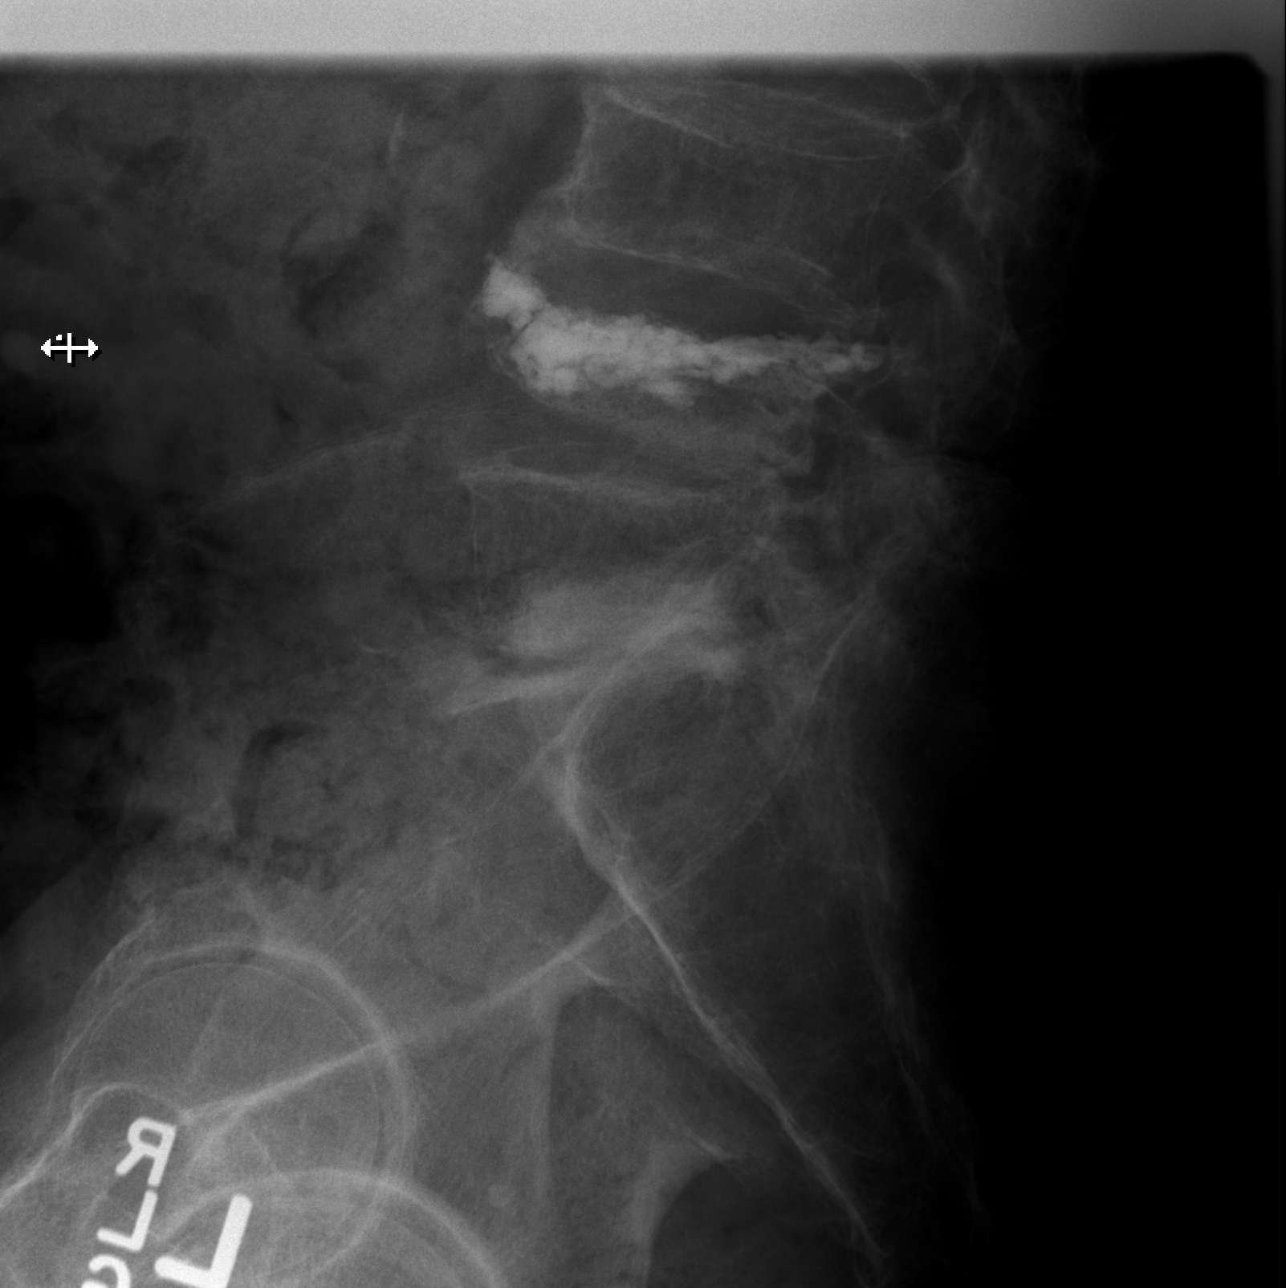

[5 of 5 positions shown; findings below may reference images not displayed]

FINDINGS: Severe osteopenia. Chronic L4 vertebral body compression fracture
with prior augmentation and approximately 70% height loss. Chronic
T12, L2, L3 and L5 vertebral body compression fractures. Severe L1
vertebral body compression fracture with increased height loss
compared with 06/06/2019 likely reflecting a acute-subacute fracture
superimposed upon a chronic fracture with currently approximately
80% height loss. Degenerative disease with severe disc height loss
and endplate sclerosis at L5-S1. Diffuse bilateral facet arthropathy
of the lumbar spine.
IMPRESSION: 1. Severe L1 vertebral body compression fracture with increased
height loss compared with 06/06/2019 likely reflecting an
acute-subacute fracture superimposed upon a chronic fracture with
currently approximately 80% height loss.
2. Chronic T12, L2, L3 and L5 vertebral body compression fractures.
